# Patient Record
Sex: Male | Born: 1935 | Race: White | Hispanic: No | Marital: Married | Smoking: Former smoker
Health system: Southern US, Community
[De-identification: ages and names within clinical notes are randomized; demographics above are authoritative.]

## PROBLEM LIST (undated history)

## (undated) DIAGNOSIS — I4891 Unspecified atrial fibrillation: Secondary | ICD-10-CM

## (undated) DIAGNOSIS — R55 Syncope and collapse: Secondary | ICD-10-CM

## (undated) DIAGNOSIS — J449 Chronic obstructive pulmonary disease, unspecified: Secondary | ICD-10-CM

## (undated) DIAGNOSIS — R251 Tremor, unspecified: Secondary | ICD-10-CM

## (undated) DIAGNOSIS — I10 Essential (primary) hypertension: Secondary | ICD-10-CM

## (undated) HISTORY — PX: BRAIN SURGERY: SHX531

## (undated) HISTORY — DX: Chronic obstructive pulmonary disease, unspecified: J44.9

## (undated) HISTORY — PX: BACK SURGERY: SHX140

---

## 2008-07-17 ENCOUNTER — Ambulatory Visit: Payer: Self-pay | Admitting: Specialist

## 2008-11-13 ENCOUNTER — Emergency Department: Payer: Self-pay | Admitting: Emergency Medicine

## 2009-07-17 ENCOUNTER — Inpatient Hospital Stay: Payer: Self-pay | Admitting: Internal Medicine

## 2011-01-19 DIAGNOSIS — G250 Essential tremor: Secondary | ICD-10-CM | POA: Insufficient documentation

## 2011-07-22 ENCOUNTER — Ambulatory Visit: Payer: Self-pay | Admitting: Cardiovascular Disease

## 2011-10-31 ENCOUNTER — Observation Stay: Payer: Self-pay | Admitting: Internal Medicine

## 2011-11-03 ENCOUNTER — Emergency Department: Payer: Self-pay | Admitting: Emergency Medicine

## 2013-07-04 DIAGNOSIS — N529 Male erectile dysfunction, unspecified: Secondary | ICD-10-CM | POA: Insufficient documentation

## 2013-07-04 DIAGNOSIS — N138 Other obstructive and reflux uropathy: Secondary | ICD-10-CM | POA: Insufficient documentation

## 2013-07-04 DIAGNOSIS — N486 Induration penis plastica: Secondary | ICD-10-CM | POA: Insufficient documentation

## 2013-11-13 ENCOUNTER — Emergency Department: Payer: Self-pay | Admitting: Emergency Medicine

## 2013-11-13 LAB — CBC WITH DIFFERENTIAL/PLATELET
Basophil #: 0.1 10*3/uL (ref 0.0–0.1)
Eosinophil #: 0.1 10*3/uL (ref 0.0–0.7)
Eosinophil %: 0.6 %
Lymphocyte #: 1.5 10*3/uL (ref 1.0–3.6)
MCH: 29.8 pg (ref 26.0–34.0)
MCV: 88 fL (ref 80–100)
Monocyte #: 1 x10 3/mm (ref 0.2–1.0)
Neutrophil #: 7 10*3/uL — ABNORMAL HIGH (ref 1.4–6.5)
Neutrophil %: 72.8 %
Platelet: 145 10*3/uL — ABNORMAL LOW (ref 150–440)
RDW: 13.2 % (ref 11.5–14.5)

## 2013-11-13 LAB — COMPREHENSIVE METABOLIC PANEL
Alkaline Phosphatase: 64 U/L
Anion Gap: 6 — ABNORMAL LOW (ref 7–16)
BUN: 20 mg/dL — ABNORMAL HIGH (ref 7–18)
EGFR (African American): 43 — ABNORMAL LOW
Glucose: 105 mg/dL — ABNORMAL HIGH (ref 65–99)
Potassium: 3.6 mmol/L (ref 3.5–5.1)
SGOT(AST): 21 U/L (ref 15–37)
Sodium: 141 mmol/L (ref 136–145)
Total Protein: 7 g/dL (ref 6.4–8.2)

## 2013-11-13 LAB — URINALYSIS, COMPLETE
Bilirubin,UR: NEGATIVE
Blood: NEGATIVE
Nitrite: NEGATIVE
RBC,UR: 2 /HPF (ref 0–5)
WBC UR: 5 /HPF (ref 0–5)

## 2014-04-15 DIAGNOSIS — J439 Emphysema, unspecified: Secondary | ICD-10-CM | POA: Insufficient documentation

## 2015-04-29 ENCOUNTER — Other Ambulatory Visit: Payer: Self-pay

## 2015-04-29 ENCOUNTER — Emergency Department
Admission: EM | Admit: 2015-04-29 | Discharge: 2015-04-29 | Disposition: A | Discharge status: Home / Self Care | Payer: Medicare Other | Attending: Emergency Medicine | Admitting: Emergency Medicine

## 2015-04-29 ENCOUNTER — Encounter: Payer: Self-pay | Admitting: Emergency Medicine

## 2015-04-29 ENCOUNTER — Emergency Department: Payer: Medicare Other

## 2015-04-29 DIAGNOSIS — S0101XA Laceration without foreign body of scalp, initial encounter: Secondary | ICD-10-CM

## 2015-04-29 DIAGNOSIS — Y998 Other external cause status: Secondary | ICD-10-CM | POA: Insufficient documentation

## 2015-04-29 DIAGNOSIS — Y9389 Activity, other specified: Secondary | ICD-10-CM | POA: Insufficient documentation

## 2015-04-29 DIAGNOSIS — Y9289 Other specified places as the place of occurrence of the external cause: Secondary | ICD-10-CM | POA: Insufficient documentation

## 2015-04-29 DIAGNOSIS — Z79899 Other long term (current) drug therapy: Secondary | ICD-10-CM | POA: Insufficient documentation

## 2015-04-29 DIAGNOSIS — I10 Essential (primary) hypertension: Secondary | ICD-10-CM | POA: Insufficient documentation

## 2015-04-29 DIAGNOSIS — S51802A Unspecified open wound of left forearm, initial encounter: Secondary | ICD-10-CM | POA: Insufficient documentation

## 2015-04-29 DIAGNOSIS — W07XXXA Fall from chair, initial encounter: Secondary | ICD-10-CM | POA: Insufficient documentation

## 2015-04-29 DIAGNOSIS — S01112A Laceration without foreign body of left eyelid and periocular area, initial encounter: Secondary | ICD-10-CM | POA: Insufficient documentation

## 2015-04-29 DIAGNOSIS — Z7982 Long term (current) use of aspirin: Secondary | ICD-10-CM | POA: Diagnosis not present

## 2015-04-29 DIAGNOSIS — R55 Syncope and collapse: Secondary | ICD-10-CM

## 2015-04-29 DIAGNOSIS — Z23 Encounter for immunization: Secondary | ICD-10-CM | POA: Diagnosis not present

## 2015-04-29 HISTORY — DX: Essential (primary) hypertension: I10

## 2015-04-29 LAB — BASIC METABOLIC PANEL
Anion gap: 7 (ref 5–15)
BUN: 19 mg/dL (ref 6–20)
CO2: 26 mmol/L (ref 22–32)
Calcium: 8.6 mg/dL — ABNORMAL LOW (ref 8.9–10.3)
Chloride: 105 mmol/L (ref 101–111)
Creatinine, Ser: 1.36 mg/dL — ABNORMAL HIGH (ref 0.61–1.24)
GFR calc Af Amer: 55 mL/min — ABNORMAL LOW (ref >60)
GFR calc non Af Amer: 47 mL/min — ABNORMAL LOW (ref >60)
Glucose, Bld: 149 mg/dL — ABNORMAL HIGH (ref 65–99)
POTASSIUM: 3.8 mmol/L (ref 3.5–5.1)
SODIUM: 138 mmol/L (ref 135–145)

## 2015-04-29 LAB — PROTIME-INR
INR: 1.04
Prothrombin Time: 13.8 seconds (ref 11.4–15.0)

## 2015-04-29 LAB — CBC WITH DIFFERENTIAL/PLATELET
BASOS ABS: 0.1 10*3/uL (ref 0–0.1)
BASOS PCT: 0 %
EOS PCT: 0 %
Eosinophils Absolute: 0.1 10*3/uL (ref 0–0.7)
HCT: 41 % (ref 40.0–52.0)
Hemoglobin: 13.2 g/dL (ref 13.0–18.0)
Lymphocytes Relative: 20 %
Lymphs Abs: 2.8 10*3/uL (ref 1.0–3.6)
MCH: 28.9 pg (ref 26.0–34.0)
MCHC: 32.2 g/dL (ref 32.0–36.0)
MCV: 89.7 fL (ref 80.0–100.0)
Monocytes Absolute: 1 10*3/uL (ref 0.2–1.0)
Monocytes Relative: 7 %
NEUTROS PCT: 73 %
Neutro Abs: 10.4 10*3/uL — ABNORMAL HIGH (ref 1.4–6.5)
Platelets: 145 10*3/uL — ABNORMAL LOW (ref 150–440)
RBC: 4.57 MIL/uL (ref 4.40–5.90)
RDW: 13.9 % (ref 11.5–14.5)
WBC: 14.4 10*3/uL — AB (ref 3.8–10.6)

## 2015-04-29 MED ORDER — TETANUS-DIPHTHERIA TOXOIDS TD 5-2 LFU IM INJ
INJECTION | INTRAMUSCULAR | Status: AC
  Filled 2015-04-29: qty 0.5

## 2015-04-29 MED ORDER — TETANUS-DIPHTHERIA TOXOIDS TD 5-2 LFU IM INJ
0.5000 mL | INJECTION | Freq: Once | INTRAMUSCULAR | Status: AC
  Administered 2015-04-29: 0.5 mL via INTRAMUSCULAR

## 2015-04-29 NOTE — ED Notes (Signed)
Abrasion noted to top of scalp and left knee.

## 2015-04-29 NOTE — ED Notes (Signed)
Patient transported to CT 

## 2015-04-29 NOTE — Discharge Instructions (Signed)
Head Injury °You have received a head injury. It does not appear serious at this time. Headaches and vomiting are common following head injury. It should be easy to awaken from sleeping. Sometimes it is necessary for you to stay in the emergency department for a while for observation. Sometimes admission to the hospital may be needed. After injuries such as yours, most problems occur within the first 24 hours, but side effects may occur up to 7-10 days after the injury. It is important for you to carefully monitor your condition and contact your health care provider or seek immediate medical care if there is a change in your condition. °WHAT ARE THE TYPES OF HEAD INJURIES? °Head injuries can be as minor as a bump. Some head injuries can be more severe. More severe head injuries include: °· A jarring injury to the brain (concussion). °· A bruise of the brain (contusion). This mean there is bleeding in the brain that can cause swelling. °· A cracked skull (skull fracture). °· Bleeding in the brain that collects, clots, and forms a bump (hematoma). °WHAT CAUSES A HEAD INJURY? °A serious head injury is most likely to happen to someone who is in a car wreck and is not wearing a seat belt. Other causes of major head injuries include bicycle or motorcycle accidents, sports injuries, and falls. °HOW ARE HEAD INJURIES DIAGNOSED? °A complete history of the event leading to the injury and your current symptoms will be helpful in diagnosing head injuries. Many times, pictures of the brain, such as CT or MRI are needed to see the extent of the injury. Often, an overnight hospital stay is necessary for observation.  °WHEN SHOULD I SEEK IMMEDIATE MEDICAL CARE?  °You should get help right away if: °· You have confusion or drowsiness. °· You feel sick to your stomach (nauseous) or have continued, forceful vomiting. °· You have dizziness or unsteadiness that is getting worse. °· You have severe, continued headaches not relieved by  medicine. Only take over-the-counter or prescription medicines for pain, fever, or discomfort as directed by your health care provider. °· You do not have normal function of the arms or legs or are unable to walk. °· You notice changes in the black spots in the center of the colored part of your eye (pupil). °· You have a clear or bloody fluid coming from your nose or ears. °· You have a loss of vision. °During the next 24 hours after the injury, you must stay with someone who can watch you for the warning signs. This person should contact local emergency services (911 in the [place] if you have seizures, you become unconscious, or you are unable to wake up. °HOW CAN I PREVENT A HEAD INJURY IN THE FUTURE? °The most important factor for preventing major head injuries is avoiding motor vehicle accidents.  To minimize the potential for damage to your head, it is crucial to wear seat belts while riding in motor vehicles. Wearing helmets while bike riding and playing collision sports (like football) is also helpful. Also, avoiding dangerous activities around the house will further help reduce your risk of head injury.  °WHEN CAN I RETURN TO NORMAL ACTIVITIES AND ATHLETICS? °You should be reevaluated by your health care provider before returning to these activities. If you have any of the following symptoms, you should not return to activities or contact sports until 1 week after the symptoms have stopped: °· Persistent headache. °· Dizziness or vertigo. °· Poor attention and concentration. °· Confusion. °·   Memory problems.  Nausea or vomiting.  Fatigue or tire easily.  Irritability.  Intolerant of bright lights or loud noises.  Anxiety or depression.  Disturbed sleep. MAKE SURE YOU:   Understand these instructions.  Will watch your condition.  Will get help right away if you are not doing well or get worse. Document Released: 11/01/2005 Document Revised: 11/06/2013 Document Reviewed:  07/09/2013 [facility] Patient Information 2015 [place], [place]. This information is not intended to replace advice given to you by your health care provider. Make sure you discuss any questions you have with your health care provider.  Syncope Syncope is a medical term for fainting or passing out. This means you lose consciousness and drop to the ground. People are generally unconscious for less than 5 minutes. You may have some muscle twitches for up to 15 seconds before waking up and returning to normal. Syncope occurs more often in older adults, but it can happen to anyone. While most causes of syncope are not dangerous, syncope can be a sign of a serious medical problem. It is important to seek medical care.  CAUSES  Syncope is caused by a sudden drop in blood flow to the brain. The specific cause is often not determined. Factors that can bring on syncope include:  Taking medicines that lower blood pressure.  Sudden changes in posture, such as standing up quickly.  Taking more medicine than prescribed.  Standing in one place for too long.  Seizure disorders.  Dehydration and excessive exposure to heat.  Low blood sugar (hypoglycemia).  Straining to have a bowel movement.  Heart disease, irregular heartbeat, or other circulatory problems.  Fear, emotional distress, seeing blood, or severe pain. SYMPTOMS  Right before fainting, you may:  Feel dizzy or light-headed.  Feel nauseous.  See all white or all black in your field of vision.  Have cold, clammy skin. DIAGNOSIS  Your health care provider will ask about your symptoms, perform a physical exam, and perform an electrocardiogram (ECG) to record the electrical activity of your heart. Your health care provider may also perform other heart or blood tests to determine the cause of your syncope which may include:  Transthoracic echocardiogram (TTE). During echocardiography, sound waves are used to evaluate how blood flows through  your heart.  Transesophageal echocardiogram (TEE).  Cardiac monitoring. This allows your health care provider to monitor your heart rate and rhythm in real time.  Holter monitor. This is a portable device that records your heartbeat and can help diagnose heart arrhythmias. It allows your health care provider to track your heart activity for several days, if needed.  Stress tests by exercise or by giving medicine that makes the heart beat faster. TREATMENT  In most cases, no treatment is needed. Depending on the cause of your syncope, your health care provider may recommend changing or stopping some of your medicines. HOME CARE INSTRUCTIONS  Have someone stay with you until you feel stable.  Do not drive, use machinery, or play sports until your health care provider says it is okay.  Keep all follow-up appointments as directed by your health care provider.  Lie down right away if you start feeling like you might faint. Breathe deeply and steadily. Wait until all the symptoms have passed.  Drink enough fluids to keep your urine clear or pale yellow.  If you are taking blood pressure or heart medicine, get up slowly and take several minutes to sit and then stand. This can reduce dizziness. [place]  IF:   You have a severe headache.  You have unusual pain in the chest, abdomen, or back.  You are bleeding from your mouth or rectum, or you have black or tarry stool.  You have an irregular or very fast heartbeat.  You have pain with breathing.  You have repeated fainting or seizure-like jerking during an episode.  You faint when sitting or lying down.  You have confusion.  You have trouble walking.  You have severe weakness.  You have vision problems. If you fainted, call your local emergency services (911 in [place] Do not drive yourself to the hospital.  MAKE SURE YOU:  Understand these instructions.  Will watch your condition.  Will get help right  away if you are not doing well or get worse. Document Released: 11/01/2005 Document Revised: 11/06/2013 Document Reviewed: 12/31/2011 [facility] Patient Information 2015 [place], [place]. This information is not intended to replace advice given to you by your health care provider. Make sure you discuss any questions you have with your health care provider.  Stitches, Staples, or Skin Adhesive Strips  Stitches (sutures), staples, and skin adhesive strips hold the skin together as it heals. They will usually be in place for 7 days or less. HOME CARE  Wash your hands with soap and water before and after you touch your wound.  Only take medicine as told by your doctor.  Cover your wound only if your doctor told you to. Otherwise, leave it open to air.  Do not get your stitches wet or dirty. If they get dirty, dab them gently with a clean washcloth. Wet the washcloth with soapy water. Do not rub. Pat them dry gently.  Do not put medicine or medicated cream on your stitches unless your doctor told you to.  Do not take out your own stitches or staples. Skin adhesive strips will fall off by themselves.  Do not pick at the wound. Picking can cause an infection.  Do not miss your follow-up appointment.  If you have problems or questions, call your doctor. GET HELP RIGHT AWAY IF:   You have a temperature by mouth above 102 F (38.9 C), not controlled by medicine.  You have chills.  You have redness or pain around your stitches.  There is puffiness (swelling) around your stitches.  You notice fluid (drainage) from your stitches.  There is a bad smell coming from your wound. MAKE SURE YOU:  Understand these instructions.  Will watch your condition.  Will get help if you are not doing well or get worse. Document Released: 08/29/2009 Document Revised: 01/24/2012 Document Reviewed: 08/29/2009 [facility] Patient Information 2015 [place], [place]. This information is not intended to replace  advice given to you by your health care provider. Make sure you discuss any questions you have with your health care provider.

## 2015-04-29 NOTE — ED Notes (Signed)
Pt discharged home after verbalizing understanding of discharge instructions; nad noted. 

## 2015-04-29 NOTE — ED Provider Notes (Signed)
[facility] Emergency Department Provider Note  ____________________________________________  Time seen: 10:10 AM on arrival by EMS  I have reviewed the triage vital signs and the nursing notes.   HISTORY  Chief Complaint Loss of Consciousness    HPI Carlos Richard is a 81 y.o. male is brought to the ED for syncope at home. 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 This morning, he was feeling fatigued and ate watermelon and then walked outside and sat in a chair. He shortly thereafter passed out and fell out of the chair onto the ground.Marland Kitchen His wife called EMS who helped the patient back up as he was artery awake again, but while they are retrieving their stretcher the patient seemed to lose consciousness again and have some myoclonic jerks. According to the wife this is not unusual for the patient. He has hit the left side of his forehead and sustained a laceration in that area. He denies headache or neck pain chest pain shortness of breath abdominal pain back pain. No nausea vomiting diarrhea, no other acute symptoms. No vision changes     Past Medical History  Diagnosis Date  . GERD (gastroesophageal reflux disease)   . Hypertension     There are no active problems to display for this patient.   Past Surgical History  Procedure Laterality Date  . Back surgery    . Brain surgery      Current Outpatient Rx  Name  Route  Sig  Dispense  Refill  . aspirin 325 MG tablet   Oral   Take 325 mg by mouth daily.         . finasteride (PROSCAR) 5 MG tablet   Oral   Take 5 mg by mouth daily.         Marland Kitchen losartan-hydrochlorothiazide (HYZAAR) 50-12.5 MG per tablet   Oral   Take 1 tablet by mouth daily.         . magnesium oxide (MAG-OX) 400 MG tablet   Oral   Take 400 mg by mouth daily.         . metoprolol  tartrate (LOPRESSOR) 25 MG tablet   Oral   Take 25 mg by mouth 2 (two) times daily.         . pantoprazole (PROTONIX) 40 MG tablet   Oral   Take 40 mg by mouth daily.         . simvastatin (ZOCOR) 20 MG tablet   Oral   Take 20 mg by mouth daily.         Marland Kitchen terazosin (HYTRIN) 5 MG capsule   Oral   Take 5 mg by mouth at bedtime.         . vitamin E 200 UNIT capsule   Oral   Take 200 Units by mouth daily.           Allergies Review of patient's allergies indicates no known allergies.  No family history on file.  Social History History  Substance Use Topics  . Smoking status: Never Smoker   . Smokeless tobacco: Not on file  . Alcohol Use: No    Review of Systems  Constitutional: No fever or chills. No weight changes Eyes:No blurry vision or double vision.  ENT: No sore throat. Cardiovascular: No chest pain. Respiratory: No dyspnea or cough. Gastrointestinal: Negative for abdominal pain,  vomiting and diarrhea.  No BRBPR or melena. Genitourinary: Negative for dysuria, urinary retention, bloody urine, or difficulty urinating. Musculoskeletal: Negative for back pain. No joint swelling or pain. Skin: Negative for rash. Neurological: Negative for headaches, focal weakness or numbness. Psychiatric:No anxiety or depression.   Endocrine:No hot/cold intolerance, changes in energy, or sleep difficulty.  10-point ROS otherwise negative.  ____________________________________________   PHYSICAL EXAM:  VITAL SIGNS: ED Triage Vitals  Enc Vitals Group     BP 04/29/15 1018 110/65 mmHg     Pulse Rate 04/29/15 1018 69     Resp 04/29/15 1118 22     Temp 04/29/15 1018 98.4 F (36.9 C)     Temp Source 04/29/15 1018 Oral     SpO2 04/29/15 1018 91 %     Weight 04/29/15 1015 170 lb (77.111 kg)     Height 04/29/15 1015  (1.753 m)     Head Cir --      Peak Flow --      Pain Score --      Pain Loc --      Pain Edu? --      Excl. in GC? --       Constitutional: Alert and oriented. Well appearing and in no distress. Eyes: No scleral icterus. No conjunctival pallor. PERRL. EOMI ENT   Head: Normocephalic with contusion at the left supraorbital ridge and a 2 cm curvilinear laceration, wound hemostatic.   Nose: No congestion/rhinnorhea. No septal hematoma   Mouth/Throat: MMM, no pharyngeal erythema. No peritonsillar mass. No uvula shift.   Neck: No stridor. No SubQ emphysema. No meningismus. Hematological/Lymphatic/Immunilogical: No cervical lymphadenopathy. Cardiovascular: RRR. Normal and symmetric distal pulses are present in all extremities. No murmurs, rubs, or gallops. Respiratory: Normal respiratory effort without tachypnea nor retractions. Breath sounds are clear and equal bilaterally. No wheezes/rales/rhonchi. Gastrointestinal: Soft and nontender. No distention. There is no CVA tenderness.  No rebound, rigidity, or guarding. Genitourinary: deferred Musculoskeletal: Nontender with normal range of motion in all extremities. No joint effusions.  No lower extremity tenderness.  No edema. No bony tenderness or deformity. There is a large skin tear on the dorsal left proximal forearm about the extensor surface of the elbow measuring approximately 5 x 5 cm. Hemostatic, no laceration through the dermis. Will be repaired with Steri-Strips. Neurologic:   Normal speech and language.  CN 2-10 normal. Motor grossly intact. No pronator drift.  Normal gait. No gross focal neurologic deficits are appreciated.  Skin:  Skin is warm, dry and intact. No rash noted.  No petechiae, purpura, or bullae. Psychiatric: Mood and affect are normal. Speech and behavior are normal. Patient exhibits appropriate insight and judgment.  ____________________________________________    LABS (pertinent positives/negatives) (all labs ordered are listed, but only abnormal results are displayed) Labs Reviewed  BASIC METABOLIC PANEL - Abnormal;  Notable for the following:    Glucose, Bld 149 (*)    Creatinine, Ser 1.36 (*)    Calcium 8.6 (*)    GFR calc non Af Amer 47 (*)    GFR calc Af Amer 55 (*)    All other components within normal limits  CBC WITH DIFFERENTIAL/PLATELET - Abnormal; Notable for the following:    WBC 14.4 (*)    Platelets 145 (*)    Neutro Abs 10.4 (*)    All other components within normal limits  PROTIME-INR   ____________________________________________   EKG  Interpreted by me Normal sinus rhythm rate of 66, left axis, normal intervals, normal  QRS, ST segments and T waves.  ____________________________________________    RADIOLOGY  CT head and C-spine unremarkable  ____________________________________________   PROCEDURES LACERATION REPAIR Performed by: Scotty Court, Darol Cush Authorized by: Sharman Cheek Consent: Verbal consent obtained. Risks and benefits: risks, benefits and alternatives were discussed Consent given by: patient Patient identity confirmed: provided demographic data Prepped and Draped in normal sterile fashion Wound explored  Laceration Location: Left supraorbital ridge at lateral eyebrow  Laceration Length: 2 cm  No Foreign Bodies seen or palpated  Anesthesia: None   Irrigation method: syringe Amount of cleaning: standard  Skin closure: Dermabond   Patient tolerance: Patient tolerated the procedure well with no immediate complications. Wound well approximated and hemostatic.  ____________________________________________   INITIAL IMPRESSION / ASSESSMENT AND PLAN / ED COURSE  Pertinent labs & imaging results that were available during my care of the patient were reviewed by me and considered in my medical decision making (see chart for details).  Patient presents with syncope and scalp laceration due to a fall with blunt head trauma. We'll CT the head and neck and check labs and provide we'll wound care and wound  repair.  ----------------------------------------- 2:04 PM on 04/29/2015 ----------------------------------------- Workup unremarkable. CT negative. Wound repaired. Patient feels at baseline. Patient and wife comfortable with plan to discharge home and follow-up with primary care. Counseled on wound care at home with the Steri-Strips on the left elbow skin tear and the skin adhesive on the eyebrow. ____________________________________________   FINAL CLINICAL IMPRESSION(S) / ED DIAGNOSES  Final diagnoses:  Scalp laceration, initial encounter  Syncope, unspecified syncope type      Sharman Cheek, MD 04/29/15 1404

## 2015-04-29 NOTE — ED Notes (Signed)
Pt returned from ct

## 2015-04-29 NOTE — ED Notes (Signed)
Pt to ed via ems with reports of having a syncope episode while sitting in his chair causing him to fall forward hitting his head. Hematoma noted to left front forehead. Ems  reports when on scene pt alert but then had symtoms of a seizure. Pt known to have seizure activity after syncopal episode. Pt a/ox3 on arrival to ed with iv in place and fluids running. edp at bedside.

## 2015-07-03 ENCOUNTER — Emergency Department: Payer: Medicare Other

## 2015-07-03 ENCOUNTER — Other Ambulatory Visit: Payer: Self-pay

## 2015-07-03 ENCOUNTER — Emergency Department
Admission: EM | Admit: 2015-07-03 | Discharge: 2015-07-03 | Disposition: A | Discharge status: Home / Self Care | Payer: Medicare Other | Attending: Emergency Medicine | Admitting: Emergency Medicine

## 2015-07-03 ENCOUNTER — Encounter: Payer: Self-pay | Admitting: *Deleted

## 2015-07-03 DIAGNOSIS — R55 Syncope and collapse: Secondary | ICD-10-CM | POA: Diagnosis not present

## 2015-07-03 DIAGNOSIS — I10 Essential (primary) hypertension: Secondary | ICD-10-CM | POA: Insufficient documentation

## 2015-07-03 DIAGNOSIS — Z79899 Other long term (current) drug therapy: Secondary | ICD-10-CM | POA: Diagnosis not present

## 2015-07-03 DIAGNOSIS — Z7982 Long term (current) use of aspirin: Secondary | ICD-10-CM | POA: Diagnosis not present

## 2015-07-03 DIAGNOSIS — R42 Dizziness and giddiness: Secondary | ICD-10-CM | POA: Diagnosis present

## 2015-07-03 HISTORY — DX: Unspecified atrial fibrillation: I48.91

## 2015-07-03 HISTORY — DX: Syncope and collapse: R55

## 2015-07-03 HISTORY — DX: Tremor, unspecified: R25.1

## 2015-07-03 LAB — URINALYSIS COMPLETE WITH MICROSCOPIC (ARMC ONLY)
BACTERIA UA: NONE SEEN
BILIRUBIN URINE: NEGATIVE
GLUCOSE, UA: NEGATIVE mg/dL
Ketones, ur: NEGATIVE mg/dL
Leukocytes, UA: NEGATIVE
NITRITE: NEGATIVE
Protein, ur: NEGATIVE mg/dL
SQUAMOUS EPITHELIAL / LPF: NONE SEEN
Specific Gravity, Urine: 1.009 (ref 1.005–1.030)
pH: 6 (ref 5.0–8.0)

## 2015-07-03 LAB — CBC
HCT: 40.6 % (ref 40.0–52.0)
Hemoglobin: 13.7 g/dL (ref 13.0–18.0)
MCH: 29.2 pg (ref 26.0–34.0)
MCHC: 33.6 g/dL (ref 32.0–36.0)
MCV: 86.9 fL (ref 80.0–100.0)
Platelets: 162 10*3/uL (ref 150–440)
RBC: 4.67 MIL/uL (ref 4.40–5.90)
RDW: 13.9 % (ref 11.5–14.5)
WBC: 6.7 10*3/uL (ref 3.8–10.6)

## 2015-07-03 LAB — BASIC METABOLIC PANEL
Anion gap: 7 (ref 5–15)
BUN: 24 mg/dL — AB (ref 6–20)
CO2: 28 mmol/L (ref 22–32)
Calcium: 8.8 mg/dL — ABNORMAL LOW (ref 8.9–10.3)
Chloride: 104 mmol/L (ref 101–111)
Creatinine, Ser: 1.46 mg/dL — ABNORMAL HIGH (ref 0.61–1.24)
GFR calc Af Amer: 50 mL/min — ABNORMAL LOW (ref >60)
GFR, EST NON AFRICAN AMERICAN: 43 mL/min — AB (ref >60)
Glucose, Bld: 119 mg/dL — ABNORMAL HIGH (ref 65–99)
Potassium: 3.4 mmol/L — ABNORMAL LOW (ref 3.5–5.1)
SODIUM: 139 mmol/L (ref 135–145)

## 2015-07-03 LAB — TROPONIN I

## 2015-07-03 MED ORDER — MECLIZINE HCL 25 MG PO TABS
25.0000 mg | ORAL_TABLET | Freq: Once | ORAL | Status: AC
  Administered 2015-07-03: 25 mg via ORAL

## 2015-07-03 MED ORDER — ONDANSETRON HCL 4 MG/2ML IJ SOLN
4.0000 mg | Freq: Once | INTRAMUSCULAR | Status: AC
  Administered 2015-07-03: 4 mg via INTRAVENOUS

## 2015-07-03 MED ORDER — MECLIZINE HCL 25 MG PO TABS
ORAL_TABLET | ORAL | Status: AC
  Administered 2015-07-03: 25 mg via ORAL
  Filled 2015-07-03: qty 1

## 2015-07-03 MED ORDER — ONDANSETRON HCL 4 MG/2ML IJ SOLN
INTRAMUSCULAR | Status: AC
  Administered 2015-07-03: 4 mg via INTRAVENOUS
  Filled 2015-07-03: qty 2

## 2015-07-03 MED ORDER — SODIUM CHLORIDE 0.9 % IV BOLUS (SEPSIS)
1000.0000 mL | Freq: Once | INTRAVENOUS | Status: AC
  Administered 2015-07-03: 1000 mL via INTRAVENOUS

## 2015-07-03 NOTE — ED Provider Notes (Signed)
[facility] Emergency Department Provider Note  ____________________________________________  Time seen: On arrival, via EMS  I have reviewed the triage vital signs and the nursing notes.   HISTORY  Chief Complaint Dizziness    HPI Carlos Richard is a 81 y.o. male with a history of near-syncope and a deep brain stimulator who presents with dizziness that started at 5 AM this morning. Patient reports he had to go the bathroom and the room was spinning. He denies focal weakness. He reports the spinning sensation has improved but he does feel mildly nauseous. He reports he has chronic ringing in his ears. He has a deep brain stimulator for  tremors that was placed years ago. He has a history of atrial fibrillation but is not anticoagulated. 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 GERD (gastroesophageal reflux disease)   . Hypertension   . Atrial fibrillation   . Near syncope   . Tremors of nervous system     There are no active problems to display for this patient.   Past Surgical History  Procedure Laterality Date  . Back surgery    . Brain surgery      stimulator placement for tremors    Current Outpatient Rx  Name  Route  Sig  Dispense  Refill  . aspirin 325 MG tablet   Oral   Take 325 mg by mouth daily.         . finasteride (PROSCAR) 5 MG tablet   Oral   Take 5 mg by mouth daily.         Marland Kitchen losartan-hydrochlorothiazide (HYZAAR) 50-12.5 MG per tablet   Oral   Take 1 tablet by mouth daily.         . magnesium oxide (MAG-OX) 400 MG tablet   Oral   Take 400 mg by mouth daily.         . metoprolol tartrate (LOPRESSOR) 25 MG tablet   Oral   Take 25 mg by mouth 2 (two) times daily.         . pantoprazole (PROTONIX) 40 MG tablet   Oral   Take 40 mg by mouth daily.         .  simvastatin (ZOCOR) 20 MG tablet   Oral   Take 20 mg by mouth daily.         Marland Kitchen terazosin (HYTRIN) 5 MG capsule   Oral   Take 5 mg by mouth at bedtime.         . vitamin E 200 UNIT capsule   Oral   Take 200 Units by mouth daily.           Allergies Review of patient's allergies indicates no known allergies.  History reviewed. No pertinent family history.  Social History Social History  Substance Use Topics  . Smoking status: Never Smoker   . Smokeless tobacco: None  . Alcohol Use: Yes     Comment: occasionally    Review of Systems  Constitutional: Negative for fever. Eyes: Negative for visual changes. ENT: Negative for sore throat Cardiovascular: Negative for chest pain. Respiratory: Negative for shortness of breath. Gastrointestinal: Negative for abdominal pain, vomiting and diarrhea. Positive for mild nausea Genitourinary: Negative for dysuria. Musculoskeletal: Negative for back pain. Skin: Negative for rash. Neurological: Negative for headaches or focal weakness.  Positive for dizziness Psychiatric: No anxiety    ____________________________________________   PHYSICAL EXAM:  VITAL SIGNS: ED Triage Vitals  Enc Vitals Group     BP 07/03/15 0730 136/69 mmHg     Pulse Rate 07/03/15 0730 54     Resp 07/03/15 0730 18     Temp 07/03/15 0730 97.8 F (36.6 C)     Temp Source 07/03/15 0730 Oral     SpO2 07/03/15 0730 94 %     Weight 07/03/15 0730 174 lb (78.926 kg)     Height 07/03/15 0730 5\' 9"  (1.753 m)     Head Cir --      Peak Flow --      Pain Score --      Pain Loc --      Pain Edu? --      Excl. in GC? --      Constitutional: Alert and oriented. Well appearing and in no distress. Pleasant and interactive Eyes: Conjunctivae are normal. No nystagmus, PERRLA, EOMI ENT   Head: Normocephalic and atraumatic.   Mouth/Throat: Mucous membranes are moist. Cardiovascular: Normal rate, regular rhythm. Normal and symmetric distal pulses are  present in all extremities. No murmurs, rubs, or gallops. Respiratory: Normal respiratory effort without tachypnea nor retractions. Breath sounds are clear and equal bilaterally.  Gastrointestinal: Soft and non-tender in all quadrants. No distention. There is no CVA tenderness. Genitourinary: deferred Musculoskeletal: Nontender with normal range of motion in all extremities. No lower extremity tenderness nor edema. Neurologic:  Normal speech and language. Normal strength in all extremities. Cranial nerves II through XII are intact Skin:  Skin is warm, dry and intact. No rash noted. Psychiatric: Mood and affect are normal. Patient exhibits appropriate insight and judgment.  ____________________________________________    LABS (pertinent positives/negatives)  Labs Reviewed  BASIC METABOLIC PANEL  CBC  URINALYSIS COMPLETEWITH MICROSCOPIC ([facility] ONLY)    ____________________________________________   EKG  ED ECG REPORT I, Jene Every, the attending physician, personally viewed and interpreted this ECG.   Date: 07/03/2015  EKG Time: 7:33 AM  Rate: 55  Rhythm: sinus bradycardia  Axis: Left axis  Intervals:none  ST&T Change: None   ____________________________________________    RADIOLOGY I have personally reviewed any xrays that were ordered on this patient: CT head unremarkable  ____________________________________________   PROCEDURES  Procedure(s) performed: none  Critical Care performed: none  ____________________________________________   INITIAL IMPRESSION / ASSESSMENT AND PLAN / ED COURSE  Pertinent labs & imaging results that were available during my care of the patient were reviewed by me and considered in my medical decision making (see chart for details).  Patient with symptoms of vertigo. We will give normal saline, Zofran for nausea, meclizine. We will obtain CT head, labs, EKG and reevaluate.  After fluids patient felt significant better. His  orthostatics were unremarkable. He no longer had any dizziness. The CT head was normal. He is anxious to leave and I think is appropriate. He will follow-up with his PCP  ____________________________________________   FINAL CLINICAL IMPRESSION(S) / ED DIAGNOSES  Final diagnoses:  Near syncope     Jene Every, MD 07/03/15 [id number]

## 2015-07-03 NOTE — Discharge Instructions (Signed)
Near-Syncope Near-syncope (commonly known as near fainting) is sudden weakness, dizziness, or feeling like you might pass out. During an episode of near-syncope, you may also develop pale skin, have tunnel vision, or feel sick to your stomach (nauseous). Near-syncope may occur when getting up after sitting or while standing for a long time. It is caused by a sudden decrease in blood flow to the brain. This decrease can result from various causes or triggers, most of which are not serious. However, because near-syncope can sometimes be a sign of something serious, a medical evaluation is required. The specific cause is often not determined. HOME CARE INSTRUCTIONS  Monitor your condition for any changes. The following actions may help to alleviate any discomfort you are experiencing:  Have someone stay with you until you feel stable.  Lie down right away and prop your feet up if you start feeling like you might faint. Breathe deeply and steadily. Wait until all the symptoms have passed. Most of these episodes last only a few minutes. You may feel tired for several hours.   Drink enough fluids to keep your urine clear or pale yellow.   If you are taking blood pressure or heart medicine, get up slowly when seated or lying down. Take several minutes to sit and then stand. This can reduce dizziness.  Follow up with your health care provider as directed. SEEK IMMEDIATE MEDICAL CARE IF:   You have a severe headache.   You have unusual pain in the chest, abdomen, or back.   You are bleeding from the mouth or rectum, or you have black or tarry stool.   You have an irregular or very fast heartbeat.   You have repeated fainting or have seizure-like jerking during an episode.   You faint when sitting or lying down.   You have confusion.   You have difficulty walking.   You have severe weakness.   You have vision problems.  MAKE SURE YOU:   Understand these instructions.  Will  watch your condition.  Will get help right away if you are not doing well or get worse. Document Released: 11/01/2005 Document Revised: 11/06/2013 Document Reviewed: 04/06/2013 ExitCare Patient Information 2015 [employer]. This information is not intended to replace advice given to you by your health care provider. Make sure you discuss any questions you have with your health care provider.  

## 2015-07-03 NOTE — ED Notes (Signed)
Per EMS report, patient woke up at 0500 and c/o dizziness, "room was spinning," nausea, but no vomiting. Patient has a hx. Of afib and near-syncopal events.

## 2017-10-31 ENCOUNTER — Encounter: Payer: Self-pay | Admitting: Urology

## 2017-10-31 ENCOUNTER — Ambulatory Visit: Payer: Medicare Other | Admitting: Urology

## 2017-10-31 VITALS — BP 111/42 | HR 92 | Ht 69.0 in | Wt 170.0 lb

## 2017-10-31 DIAGNOSIS — N401 Enlarged prostate with lower urinary tract symptoms: Secondary | ICD-10-CM | POA: Diagnosis not present

## 2017-10-31 MED ORDER — TERAZOSIN HCL 5 MG PO CAPS: 5.0000 mg | ORAL_CAPSULE | Freq: Every day | ORAL | 3 refills | Status: DC

## 2017-10-31 MED ORDER — FINASTERIDE 5 MG PO TABS: 5.0000 mg | ORAL_TABLET | Freq: Every day | ORAL | 3 refills | Status: DC

## 2017-10-31 NOTE — Progress Notes (Signed)
10/31/2017 8:43 AM   Carlos SnowHoward R Richard [birth year] [id number]  Referring provider: Steele Sizerrissman, Mark A, MD [address] [place], [place] [address]  Chief Complaint  Patient presents with  . Benign Prostatic Hypertrophy    1year    HPI: 83 year old male presents for annual follow-up of BPH.  He remains on combination therapy with finasteride and terazosin.  He has no bothersome lower urinary tract symptoms.  [facility].  A prostate biopsy was performed in 2007 for a PSA of 4.7.  His PSA subsequently normalized and he elected to discontinue PSA screening 2 years ago.  He denies dysuria or gross hematuria.  He denies flank, abdominal, pelvic or scrotal pain.   PMH: Past Medical History:  Diagnosis Date  . Atrial fibrillation (HCC)   . GERD (gastroesophageal reflux disease)   . Hypertension   . Near syncope   . Tremors of nervous system     Surgical History: Past Surgical History:  Procedure Laterality Date  . BACK SURGERY    . BRAIN SURGERY     stimulator placement for tremors    Home Medications:  Allergies as of 10/31/2017   No Known Allergies     Medication List        Accurate as of 10/31/17  8:43 AM. Always use your most recent med list.          aspirin 325 MG tablet Take 325 mg by mouth daily.   CHERRY PO Take 1 capsule by mouth daily. Taken for gout   finasteride 5 MG tablet Commonly known as:  PROSCAR Take 5 mg by mouth daily.   furosemide 20 MG tablet Commonly known as:  LASIX   losartan 50 MG tablet Commonly known as:  COZAAR   losartan-hydrochlorothiazide 50-12.5 MG tablet Commonly known as:  HYZAAR Take 1 tablet by mouth daily.   magnesium oxide 400 MG tablet Commonly known as:  MAG-OX Take 400 mg by mouth daily.   metoprolol tartrate 25 MG tablet Commonly known as:  LOPRESSOR Take 25 mg by mouth daily.   pantoprazole 40 MG tablet Commonly known as:  PROTONIX Take 40 mg by mouth daily.   simvastatin 20  MG tablet Commonly known as:  ZOCOR Take 20 mg by mouth daily.   terazosin 5 MG capsule Commonly known as:  HYTRIN Take 5 mg by mouth at bedtime.   TOPROL XL 25 MG 24 hr tablet Generic drug:  metoprolol succinate   vitamin E 200 UNIT capsule Take 200 Units by mouth daily.   XARELTO 15 MG Tabs tablet Generic drug:  Rivaroxaban   ZINC PO Take 1 tablet by mouth daily.       Allergies: No Known Allergies  Family History: Family History  Problem Relation Age of Onset  . Prostate cancer Neg Hx   . Bladder Cancer Neg Hx   . Kidney cancer Neg Hx     Social History:  reports that he has quit smoking. he has never used smokeless tobacco. He reports that he drinks alcohol. He reports that he does not use drugs.  ROS: UROLOGY Frequent Urination?: No Hard to postpone urination?: No Burning/pain with urination?: No Get up at night to urinate?: No Leakage of urine?: No Urine stream starts and stops?: No Trouble starting stream?: No Do you have to strain to urinate?: No Blood in urine?: No Urinary tract infection?: No Sexually transmitted disease?: No Injury to kidneys or bladder?: No Painful intercourse?: No  Weak stream?: No Erection problems?: No Penile pain?: No  Gastrointestinal Nausea?: No Vomiting?: No Indigestion/heartburn?: No Diarrhea?: No Constipation?: No  Constitutional Fever: No Night sweats?: No Weight loss?: No Fatigue?: No  Skin Skin rash/lesions?: No Itching?: No  Eyes Blurred vision?: No Double vision?: No  Ears/Nose/Throat Sore throat?: No Sinus problems?: No  Hematologic/Lymphatic Swollen glands?: No Easy bruising?: No  Cardiovascular Leg swelling?: No Chest pain?: No  Respiratory Cough?: No Shortness of breath?: No  Endocrine Excessive thirst?: No  Musculoskeletal Back pain?: No Joint pain?: No  Neurological Headaches?: No Dizziness?: No  Psychologic Depression?: No Anxiety?: No  Physical Exam: BP (!)  111/42   Pulse 92   Ht 5\' 9"  (1.753 m)   Wt 170 lb (77.1 kg)   BMI 25.10 kg/m   Constitutional:  Alert and oriented, No acute distress. HEENT: [place] AT, moist mucus membranes.  Trachea midline, no masses. Cardiovascular: No clubbing, cyanosis, or edema. Respiratory: Normal respiratory effort, no increased work of breathing. GI: Abdomen is soft, nontender, nondistended, no abdominal masses GU: No CVA tenderness.  Prostate 50 g, smooth without nodules Skin: No rashes, bruises or suspicious lesions. Lymph: No cervical or inguinal adenopathy. Neurologic: Grossly intact, no focal deficits, moving all 4 extremities. Psychiatric: Normal mood and affect.  Laboratory Data: Lab Results  Component Value Date   WBC 6.7 07/03/2015   HGB 13.7 07/03/2015   HCT 40.6 07/03/2015   MCV 86.9 07/03/2015   PLT 162 07/03/2015    Lab Results  Component Value Date   CREATININE 1.46 (H) 07/03/2015     Assessment & Plan:    1. Benign prostatic hyperplasia with lower urinary tract symptoms, symptom details unspecified Stable.  Terazosin and finasteride were refilled.  Continue annual follow-up.   Return in about 1 year (around 10/31/2018) for Recheck.  Riki AltesScott C Samanthamarie Ezzell, MD  [facility] [address], [address] [place], [place] [address] [phone]

## 2018-08-03 ENCOUNTER — Encounter: Payer: Self-pay | Admitting: Family Medicine

## 2018-08-03 ENCOUNTER — Ambulatory Visit: Payer: Medicare Other | Admitting: Family Medicine

## 2018-08-03 VITALS — BP 122/68 | HR 68 | Temp 97.5°F | Ht 68.2 in | Wt 172.0 lb

## 2018-08-03 DIAGNOSIS — I129 Hypertensive chronic kidney disease with stage 1 through stage 4 chronic kidney disease, or unspecified chronic kidney disease: Secondary | ICD-10-CM | POA: Insufficient documentation

## 2018-08-03 DIAGNOSIS — G250 Essential tremor: Secondary | ICD-10-CM

## 2018-08-03 DIAGNOSIS — M109 Gout, unspecified: Secondary | ICD-10-CM | POA: Insufficient documentation

## 2018-08-03 DIAGNOSIS — M1A9XX1 Chronic gout, unspecified, with tophus (tophi): Secondary | ICD-10-CM

## 2018-08-03 DIAGNOSIS — I10 Essential (primary) hypertension: Secondary | ICD-10-CM | POA: Diagnosis not present

## 2018-08-03 DIAGNOSIS — J449 Chronic obstructive pulmonary disease, unspecified: Secondary | ICD-10-CM | POA: Insufficient documentation

## 2018-08-03 DIAGNOSIS — N401 Enlarged prostate with lower urinary tract symptoms: Secondary | ICD-10-CM

## 2018-08-03 DIAGNOSIS — N138 Other obstructive and reflux uropathy: Secondary | ICD-10-CM

## 2018-08-03 DIAGNOSIS — J439 Emphysema, unspecified: Secondary | ICD-10-CM | POA: Diagnosis not present

## 2018-08-03 DIAGNOSIS — I4891 Unspecified atrial fibrillation: Secondary | ICD-10-CM | POA: Diagnosis not present

## 2018-08-03 DIAGNOSIS — E782 Mixed hyperlipidemia: Secondary | ICD-10-CM

## 2018-08-03 LAB — UA/M W/RFLX CULTURE, ROUTINE
BILIRUBIN UA: NEGATIVE
Glucose, UA: NEGATIVE
KETONES UA: NEGATIVE
LEUKOCYTES UA: NEGATIVE
NITRITE UA: NEGATIVE
RBC UA: NEGATIVE
SPEC GRAV UA: 1.015 (ref 1.005–1.030)
Urobilinogen, Ur: 0.2 mg/dL (ref 0.2–1.0)
pH, UA: 7.5 (ref 5.0–7.5)

## 2018-08-03 LAB — MICROALBUMIN, URINE WAIVED
Creatinine, Urine Waived: 200 mg/dL (ref 10–300)
Microalb, Ur Waived: 80 mg/L — ABNORMAL HIGH (ref 0–19)

## 2018-08-03 MED ORDER — FLUTICASONE-SALMETEROL 250-50 MCG/DOSE IN AEPB: 1.0000 | INHALATION_SPRAY | Freq: Two times a day (BID) | RESPIRATORY_TRACT | 3 refills | Status: DC

## 2018-08-03 MED ORDER — LOSARTAN POTASSIUM 50 MG PO TABS: 50.0000 mg | ORAL_TABLET | Freq: Every day | ORAL | 1 refills | Status: DC

## 2018-08-03 MED ORDER — TOPROL XL 25 MG PO TB24: 25.0000 mg | ORAL_TABLET | Freq: Every day | ORAL | 1 refills | Status: DC

## 2018-08-03 MED ORDER — SIMVASTATIN 20 MG PO TABS: 20.0000 mg | ORAL_TABLET | Freq: Every day | ORAL | 1 refills | Status: DC

## 2018-08-03 NOTE — Progress Notes (Signed)
BP 122/68 (BP Location: Left Arm, Patient Position: Sitting, Cuff Size: Normal)   Pulse 68   Temp (!) 97.5 F (36.4 C)   Ht 5' 8.2" (1.732 m)   Wt 172 lb (78 kg)   SpO2 97%   BMI 26.00 kg/m    Subjective:    Patient ID: Carlos Richard, male    DOB: 06-Sep-1932, 84 y.o.   MRN: [id number]  HPI: Carlos Richard is a 84 y.o. male who presents today to establish care.  Chief Complaint  Patient presents with  . Establish Care  . Hypertension  . Hyperlipidemia   HYPERTENSION / HYPERLIPIDEMIA Satisfied with current treatment? yes Duration of hypertension: chronic BP monitoring frequency: not checking BP medication side effects: no Past BP meds: losartan, metoprolol, lasix Duration of hyperlipidemia: chronic Cholesterol medication side effects: no Cholesterol supplements: none Past cholesterol medications: simvastatin Medication compliance: excellent compliance Aspirin: no Recent stressors: no Recurrent headaches: no Visual changes: no Palpitations: no Dyspnea: no Chest pain: no Lower extremity edema: no Dizzy/lightheaded: no   Active Ambulatory Problems    Diagnosis Date Noted  . 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 Essential tremor 01/19/2011  . 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 Hypertension   . COPD (chronic obstructive pulmonary disease) (HCC)   . Atrial fibrillation (HCC)   . Gout 08/03/2018  . Mixed hyperlipidemia 08/03/2018   Resolved Ambulatory Problems    Diagnosis Date Noted  . Emphysema, unspecified (HCC) 04/15/2014   Past Medical History:  Diagnosis Date  . Near syncope   . Tremors of nervous system    Past Surgical History:  Procedure Laterality Date  . BACK SURGERY    . BRAIN SURGERY     stimulator placement for tremors   Outpatient Encounter Medications as of 08/03/2018  Medication Sig  . CHERRY PO Take 1 capsule by mouth daily. Taken for gout  .  finasteride (PROSCAR) 5 MG tablet Take 1 tablet (5 mg total) by mouth daily.  . furosemide (LASIX) 20 MG tablet   . losartan (COZAAR) 50 MG tablet Take 1 tablet (50 mg total) by mouth daily.  . magnesium oxide (MAG-OX) 400 MG tablet Take 400 mg by mouth daily.  . Multiple Vitamins-Minerals (ZINC PO) Take 1 tablet by mouth daily.  . simvastatin (ZOCOR) 20 MG tablet Take 1 tablet (20 mg total) by mouth daily.  Marland Kitchen terazosin (HYTRIN) 5 MG capsule Take 1 capsule (5 mg total) by mouth at bedtime.  . TOPROL XL 25 MG 24 hr tablet Take 1 tablet (25 mg total) by mouth daily.  Marland Kitchen UNABLE TO FIND CuraMed  . vitamin E 200 UNIT capsule Take 200 Units by mouth daily.  [name] 15 MG TABS tablet   . Zinc 50 MG CAPS Take by mouth.  . [DISCONTINUED] losartan (COZAAR) 50 MG tablet   . [DISCONTINUED] metoprolol tartrate (LOPRESSOR) 25 MG tablet Take 25 mg by mouth daily.   . [DISCONTINUED] simvastatin (ZOCOR) 20 MG tablet Take 20 mg by mouth daily.  . [DISCONTINUED] TOPROL XL 25 MG 24 hr tablet   . Fluticasone-Salmeterol (ADVAIR DISKUS) 250-50 MCG/DOSE AEPB Inhale 1 puff into the lungs 2 (two) times daily.  . [DISCONTINUED] aspirin 325 MG tablet Take 325 mg by mouth daily.  . [DISCONTINUED] losartan-hydrochlorothiazide (HYZAAR) 50-12.5 MG per tablet Take 1 tablet by mouth daily.  . [DISCONTINUED] pantoprazole (PROTONIX) 40 MG tablet Take 40 mg by mouth daily.  No facility-administered encounter medications on file as of 08/03/2018.    No Known Allergies Social History   Socioeconomic History  . Marital status: Married    Spouse name: Not on file  . Number of children: Not on file  . Years of education: Not on file  . Highest education level: Not on file  Occupational History  . Not on file  Social Needs  . Financial resource strain: Not on file  . Food insecurity:    Worry: Not on file    Inability: Not on file  . Transportation needs:    Medical: Not on file    Non-medical: Not on file  Tobacco  Use  . Smoking status: Former Games developermoker  . Smokeless tobacco: Never Used  Substance and Sexual Activity  . Alcohol use: Yes    Comment: occasionally  . Drug use: No  . Sexual activity: Yes  Lifestyle  . Physical activity:    Days per week: Not on file    Minutes per session: Not on file  . Stress: Not on file  Relationships  . Social connections:    Talks on phone: Not on file    Gets together: Not on file    Attends religious service: Not on file    Active member of club or organization: Not on file    Attends meetings of clubs or organizations: Not on file    Relationship status: Not on file  Other Topics Concern  . Not on file  Social History Narrative  . Not on file   Family History  Problem Relation Age of Onset  . Tuberculosis Mother   . Tremor Paternal Grandmother   . Obesity Sister   . Prostate cancer Neg Hx   . Bladder Cancer Neg Hx   . Kidney cancer Neg Hx      Review of Systems  Constitutional: Negative.   Respiratory: Negative.   Cardiovascular: Negative.   Genitourinary: Negative.   Musculoskeletal: Negative.   Skin: Negative.   Psychiatric/Behavioral: Negative.     Per HPI unless specifically indicated above     Objective:    BP 122/68 (BP Location: Left Arm, Patient Position: Sitting, Cuff Size: Normal)   Pulse 68   Temp (!) 97.5 F (36.4 C)   Ht 5' 8.2" (1.732 m)   Wt 172 lb (78 kg)   SpO2 97%   BMI 26.00 kg/m   Wt Readings from Last 3 Encounters:  08/03/18 172 lb (78 kg)  10/31/17 170 lb (77.1 kg)  07/03/15 174 lb (78.9 kg)    Physical Exam  Constitutional: He is oriented to person, place, and time. He appears well-developed and well-nourished. No distress.  HENT:  Head: Normocephalic and atraumatic.  Right Ear: Hearing normal.  Left Ear: Hearing normal.  Nose: Nose normal.  Eyes: Conjunctivae and lids are normal. Right eye exhibits no discharge. Left eye exhibits no discharge. No scleral icterus.  Cardiovascular: Normal rate,  regular rhythm, normal heart sounds and intact distal pulses. Exam reveals no gallop and no friction rub.  No murmur heard. Pulmonary/Chest: Effort normal and breath sounds normal. No stridor. No respiratory distress. He has no wheezes. He has no rales. He exhibits no tenderness.  Musculoskeletal: Normal range of motion.  Neurological: He is alert and oriented to person, place, and time.  Skin: Skin is warm, dry and intact. Capillary refill takes less than 2 seconds. No rash noted. He is not diaphoretic. No erythema. No pallor.  Psychiatric: He has a  normal mood and affect. His speech is normal and behavior is normal. Judgment and thought content normal. Cognition and memory are normal.  Nursing note and vitals reviewed.   Results for orders placed or performed in visit on 08/03/18  Microalbumin, Urine Waived  Result Value Ref Range   Microalb, Ur Waived 80 (H) 0 - 19 mg/L   Creatinine, Urine Waived 200 10 - 300 mg/dL   Microalb/Creat Ratio 30-300 (H) <30 mg/g  UA/M w/rflx Culture, Routine  Result Value Ref Range   Specific Gravity, UA 1.015 1.005 - 1.030   pH, UA 7.5 5.0 - 7.5   Color, UA Orange Yellow   Appearance Ur Clear Clear   Leukocytes, UA Negative Negative   Protein, UA Trace (A) Negative/Trace   Glucose, UA Negative Negative   Ketones, UA Negative Negative   RBC, UA Negative Negative   Bilirubin, UA Negative Negative   Urobilinogen, Ur 0.2 0.2 - 1.0 mg/dL   Nitrite, UA Negative Negative      Assessment & Plan:   Problem List Items Addressed This Visit      Cardiovascular and Mediastinum   Hypertension - Primary    Under good control on current regimen. Continue current regimen. Continue to monitor. Call with any concerns. Refills given.        Relevant Medications   TOPROL XL 25 MG 24 hr tablet   simvastatin (ZOCOR) 20 MG tablet   losartan (COZAAR) 50 MG tablet   Other Relevant Orders   Comprehensive metabolic panel   Microalbumin, Urine Waived (Completed)    UA/M w/rflx Culture, Routine (Completed)   TSH   Atrial fibrillation (HCC)    Stable. Continue to follow with cardiology. Continue xarelto. Call with any concerns.       Relevant Medications   TOPROL XL 25 MG 24 hr tablet   simvastatin (ZOCOR) 20 MG tablet   losartan (COZAAR) 50 MG tablet   Other Relevant Orders   CBC with Differential/Platelet   Comprehensive metabolic panel   UA/M w/rflx Culture, Routine (Completed)   TSH     Respiratory   COPD (chronic obstructive pulmonary disease) (HCC)    Needs refill on his advair. Rx given today. Call with any concerns.       Relevant Medications   Fluticasone-Salmeterol (ADVAIR DISKUS) 250-50 MCG/DOSE AEPB   Other Relevant Orders   Comprehensive metabolic panel   UA/M w/rflx Culture, Routine (Completed)   TSH     Nervous and Auditory   Essential tremor   Relevant Orders   Comprehensive metabolic panel   UA/M w/rflx Culture, Routine (Completed)   TSH     Genitourinary   BPH with obstruction/lower urinary tract symptoms    Follows with urology. Stable. Continue to monitor. Call with any concerns.       Relevant Orders   Comprehensive metabolic panel   UA/M w/rflx Culture, Routine (Completed)   TSH     Other   Gout    Under good control on current regimen. Continue current regimen. Continue to monitor. Call with any concerns. Refills given.        Relevant Orders   Comprehensive metabolic panel   UA/M w/rflx Culture, Routine (Completed)   TSH   Uric acid   Mixed hyperlipidemia    Under good control on current regimen. Continue current regimen. Continue to monitor. Call with any concerns. Refills given.        Relevant Medications   TOPROL XL 25 MG 24 hr tablet  simvastatin (ZOCOR) 20 MG tablet   losartan (COZAAR) 50 MG tablet   Other Relevant Orders   Lipid Panel w/o Chol/HDL Ratio   UA/M w/rflx Culture, Routine (Completed)   TSH       Follow up plan: Return in about 6 months (around 02/01/2019) for  Wellness/physical.

## 2018-08-03 NOTE — Assessment & Plan Note (Signed)
Under good control on current regimen. Continue current regimen. Continue to monitor. Call with any concerns. Refills given.   

## 2018-08-03 NOTE — Assessment & Plan Note (Signed)
Follows with urology. Stable. Continue to monitor. Call with any concerns.

## 2018-08-03 NOTE — Assessment & Plan Note (Signed)
Stable. Continue to follow with cardiology. Continue xarelto. Call with any concerns.

## 2018-08-03 NOTE — Assessment & Plan Note (Signed)
Needs refill on his advair. Rx given today. Call with any concerns.

## 2018-08-04 LAB — COMPREHENSIVE METABOLIC PANEL
A/G RATIO: 2 (ref 1.2–2.2)
ALT: 21 IU/L (ref 0–44)
AST: 24 IU/L (ref 0–40)
Albumin: 4.4 g/dL (ref 3.5–4.7)
Alkaline Phosphatase: 64 IU/L (ref 39–117)
BILIRUBIN TOTAL: 0.8 mg/dL (ref 0.0–1.2)
BUN/Creatinine Ratio: 9 — ABNORMAL LOW (ref 10–24)
BUN: 12 mg/dL (ref 8–27)
CHLORIDE: 105 mmol/L (ref 96–106)
CO2: 25 mmol/L (ref 20–29)
Calcium: 9.4 mg/dL (ref 8.6–10.2)
Creatinine, Ser: 1.31 mg/dL — ABNORMAL HIGH (ref 0.76–1.27)
GFR calc non Af Amer: 50 mL/min/{1.73_m2} — ABNORMAL LOW (ref >59)
GFR, EST AFRICAN AMERICAN: 57 mL/min/{1.73_m2} — AB (ref >59)
Globulin, Total: 2.2 g/dL (ref 1.5–4.5)
Glucose: 99 mg/dL (ref 65–99)
POTASSIUM: 4.6 mmol/L (ref 3.5–5.2)
Sodium: 145 mmol/L — ABNORMAL HIGH (ref 134–144)
Total Protein: 6.6 g/dL (ref 6.0–8.5)

## 2018-08-04 LAB — CBC WITH DIFFERENTIAL/PLATELET
BASOS: 1 %
Basophils Absolute: 0.1 10*3/uL (ref 0.0–0.2)
EOS (ABSOLUTE): 0.3 10*3/uL (ref 0.0–0.4)
Eos: 3 %
Hematocrit: 43.3 % (ref 37.5–51.0)
Hemoglobin: 14.3 g/dL (ref 13.0–17.7)
Immature Grans (Abs): 0 10*3/uL (ref 0.0–0.1)
Immature Granulocytes: 0 %
LYMPHS: 32 %
Lymphocytes Absolute: 3 10*3/uL (ref 0.7–3.1)
MCH: 30 pg (ref 26.6–33.0)
MCHC: 33 g/dL (ref 31.5–35.7)
MCV: 91 fL (ref 79–97)
MONOCYTES: 8 %
MONOS ABS: 0.7 10*3/uL (ref 0.1–0.9)
NEUTROS ABS: 5.1 10*3/uL (ref 1.4–7.0)
NEUTROS PCT: 56 %
PLATELETS: 193 10*3/uL (ref 150–450)
RBC: 4.76 x10E6/uL (ref 4.14–5.80)
RDW: 13.5 % (ref 12.3–15.4)
WBC: 9.2 10*3/uL (ref 3.4–10.8)

## 2018-08-04 LAB — URIC ACID: URIC ACID: 6.9 mg/dL (ref 3.7–8.6)

## 2018-08-04 LAB — LIPID PANEL W/O CHOL/HDL RATIO
Cholesterol, Total: 129 mg/dL (ref 100–199)
HDL: 40 mg/dL (ref >39)
LDL Calculated: 62 mg/dL (ref 0–99)
TRIGLYCERIDES: 133 mg/dL (ref 0–149)
VLDL Cholesterol Cal: 27 mg/dL (ref 5–40)

## 2018-08-04 LAB — TSH: TSH: 1.35 u[IU]/mL (ref 0.450–4.500)

## 2018-08-07 ENCOUNTER — Encounter: Payer: Self-pay | Admitting: Family Medicine

## 2018-08-07 DIAGNOSIS — N183 Chronic kidney disease, stage 3 unspecified: Secondary | ICD-10-CM | POA: Insufficient documentation

## 2018-08-09 ENCOUNTER — Telehealth: Payer: Self-pay | Admitting: Family Medicine

## 2018-08-09 NOTE — Telephone Encounter (Signed)
Copied from CRM [id number]. Topic: General - Other >> Aug 09, 2018 10:56 AM Trula Slade wrote: Reason for CRM:  Amy w/[facility] [phone] Ref# [id number].  The patient's insurance company does not cover TOPROL XL 25 MG 24 hr tablet medication unless there are special circumstances that exist.  They do cover Metoprolol Succinate ER 25mg . Please advise.

## 2018-08-09 NOTE — Telephone Encounter (Signed)
Patient must have brand name? They will Metoprol ER they will cover, but they do not cover Toprol. Please advise.

## 2018-08-09 NOTE — Telephone Encounter (Signed)
He has been on the medicine for years, it works well, and he has difficulty remembering to take medicine BID. Let's see if that works. I'm wondering if he needs a PA

## 2018-08-10 NOTE — Telephone Encounter (Signed)
Can we check with the patient as to why he needs the brand name? I don't know him and I don't know why he would.

## 2018-08-10 NOTE — Telephone Encounter (Signed)
Fabulous!

## 2018-08-10 NOTE — Telephone Encounter (Signed)
Called and verified that patient does not need brand name. Called [employer] back and spoke to Leslie, Teacher, early years/pre, and RX was changed to generic. While on the phone with patient he asked about Advair. Victorino Dike confirmed it was received and will be sent out soon!

## 2018-10-30 ENCOUNTER — Ambulatory Visit: Payer: Medicare Other | Admitting: Urology

## 2018-11-15 HISTORY — PX: EYE SURGERY: SHX253

## 2018-11-23 ENCOUNTER — Encounter: Payer: Self-pay | Admitting: Nurse Practitioner

## 2018-11-23 ENCOUNTER — Ambulatory Visit (INDEPENDENT_AMBULATORY_CARE_PROVIDER_SITE_OTHER): Payer: Medicare Other | Admitting: Nurse Practitioner

## 2018-11-23 VITALS — BP 136/65 | HR 63 | Temp 98.0°F | Wt 173.0 lb

## 2018-11-23 DIAGNOSIS — Z01818 Encounter for other preprocedural examination: Secondary | ICD-10-CM

## 2018-11-23 LAB — PROTIME-INR
INR: 1.3 — ABNORMAL HIGH (ref 0.8–1.2)
Prothrombin Time: 16.2 s — ABNORMAL HIGH (ref 9.1–12.0)

## 2018-11-23 NOTE — Assessment & Plan Note (Signed)
EKG remains similar to previous on review.  Denies cardiac, pulmonary, or neuro symptoms at this time.  Will obtain CBC, BMP, INR (1.3 today).  If within normal or baseline range will clear for surgery.

## 2018-11-23 NOTE — Patient Instructions (Signed)
Atrial Fibrillation    Atrial fibrillation is a type of heartbeat that is irregular or fast (rapid). If you have this condition, your heart beats without any order. This makes it hard for your heart to pump blood in a normal way. Having this condition gives you more risk for stroke, heart failure, and other heart problems.  Atrial fibrillation may start all of a sudden and then stop on its own, or it may become a long-lasting problem.  What are the causes?  This condition may be caused by heart conditions, such as:  · High blood pressure.  · Heart failure.  · Heart valve disease.  · Heart surgery.  Other causes include:  · Pneumonia.  · Obstructive sleep apnea.  · Lung cancer.  · Thyroid disease.  · Drinking too much alcohol.  Sometimes the cause is not known.  What increases the risk?  You are more likely to develop this condition if:  · You smoke.  · You are older.  · You have diabetes.  · You are overweight.  · You have a family history of this condition.  · You exercise often and hard.  What are the signs or symptoms?  Common symptoms of this condition include:  · A feeling like your heart is beating very fast.  · Chest pain.  · Feeling short of breath.  · Feeling light-headed or weak.  · Getting tired easily.  Follow these instructions at home:  Medicines  · Take over-the-counter and prescription medicines only as told by your doctor.  · If your doctor gives you a blood-thinning medicine, take it exactly as told. Taking too much of it can cause bleeding. Taking too little of it does not protect you against clots. Clots can cause a stroke.  Lifestyle         · Do not use any tobacco products. These include cigarettes, chewing tobacco, and e-cigarettes. If you need help quitting, ask your doctor.  · Do not drink alcohol.  · Do not drink beverages that have caffeine. These include coffee, soda, and tea.  · Follow diet instructions as told by your doctor.  · Exercise regularly as told by your doctor.  General  instructions  · If you have a condition that causes breathing to stop for a short period of time (apnea), treat it as told by your doctor.  · Keep a healthy weight. Do not use diet pills unless your doctor says they are safe for you. Diet pills may make heart problems worse.  · Keep all follow-up visits as told by your doctor. This is important.  Contact a doctor if:  · You notice a change in the speed, rhythm, or strength of your heartbeat.  · You are taking a blood-thinning medicine and you see more bruising.  · You get tired more easily when you move or exercise.  · You have a sudden change in weight.  Get help right away if:    · You have pain in your chest or your belly (abdomen).  · You have trouble breathing.  · You have blood in your vomit, poop, or pee (urine).  · You have any signs of a stroke. "BE FAST" is an easy way to remember the main warning signs:  ? B - Balance. Signs are dizziness, sudden trouble walking, or loss of balance.  ? E - Eyes. Signs are trouble seeing or a change in how you see.  ? F - Face. Signs are sudden weakness   or loss of feeling in the face, or the face or eyelid drooping on one side.  ? A - Arms. Signs are weakness or loss of feeling in an arm. This happens suddenly and usually on one side of the body.  ? S - Speech. Signs are sudden trouble speaking, slurred speech, or trouble understanding what people say.  ? T - Time. Time to call emergency services. Write down what time symptoms started.  · You have other signs of a stroke, such as:  ? A sudden, very bad headache with no known cause.  ? Feeling sick to your stomach (nausea).  ? Throwing up (vomiting).  ? Jerky movements you cannot control (seizure).  These symptoms may be an emergency. Do not wait to see if the symptoms will go away. Get medical help right away. Call your local emergency services (911 in the [place] Do not drive yourself to the hospital.  Summary  · Atrial fibrillation is a type of heartbeat that is irregular  or fast (rapid).  · You are at higher risk of this condition if you smoke, are older, have diabetes, or are overweight.  · Follow your doctor's instructions about medicines, diet, exercise, and follow-up visits.  · Get help right away if you think that you have signs of a stroke.  This information is not intended to replace advice given to you by your health care provider. Make sure you discuss any questions you have with your health care provider.  Document Released: 08/10/2008 Document Revised: 12/23/2017 Document Reviewed: 12/23/2017  Elsevier Interactive Patient Education © 2019 Elsevier Inc.

## 2018-11-23 NOTE — Progress Notes (Signed)
BP 136/65   Pulse 63   Temp 98 F (36.7 C) (Oral)   Wt 173 lb (78.5 kg)   SpO2 95%   BMI 26.15 kg/m    Subjective:    Patient ID: Carlos Richard, male    DOB: 04-08-1932, 85 y.o.   MRN: [id number]  HPI: Carlos Richard is a 85 y.o. male presents for surgical clearance  Chief Complaint  Patient presents with  . Surgery Clearance   SURGICAL CLEARANCE: Patient is here for surgical clearance. Is having unilateral Medtronic DBS generator change on 12/05/2018.  Last saw Dr[facility] Cheri [facility]uppyMurrow [facility] on 10[facility]/18.  Reports he has upcoming preop and anesthesia appointment in one week.  No concerns about the surgery. He will be released to go home after [facility]. He does not smoke or drink alcohol. No cardiac or neurologic symptoms at this time.  EKG today comparable to previous EKG on review.  Is followed by [facility]. Park [facility]edKahn [facility] and was last seen 10/09/18 with echo performed with EF 81%.  Is on Xarelto for a-fib, he will discuss with surgeon if this needs to be held prior to surgery.  If recommend by surgeon to hold, then may hold medication.     Relevant past medical, surgical, family and social history reviewed and updated as indicated. Interim medical history since our last visit reviewed. Allergies and medications reviewed and updated.  Review of Systems  Constitutional: Negative for activity change, diaphoresis, fatigue and fever.  Respiratory: Negative for cough, chest tightness, shortness of breath and wheezing.   Cardiovascular: Negative for chest pain, palpitations and leg swelling.  Gastrointestinal: Negative for abdominal distention, abdominal pain, constipation, diarrhea, nausea and vomiting.  Endocrine: Negative for cold intolerance, heat intolerance, polydipsia, polyphagia and polyuria.  Musculoskeletal: Negative.   Skin: Negative.   Neurological: Negative for dizziness, syncope, weakness, light-headedness, numbness and  headaches.  Psychiatric/Behavioral: Negative.     Per HPI unless specifically indicated above     Objective:    BP 136/65   Pulse 63   Temp 98 F (36.7 C) (Oral)   Wt 173 lb (78.5 kg)   SpO2 95%   BMI 26.15 kg/m   Wt Readings from Last 3 Encounters:  11/23/18 173 lb (78.5 kg)  08/03/18 172 lb (78 kg)  10/31/17 170 lb (77.1 kg)    Physical Exam Vitals signs and nursing note reviewed.  Constitutional:      Appearance: He is well-developed.  HENT:     Head: Normocephalic and atraumatic.     Right Ear: Hearing normal. No drainage.     Left Ear: Hearing normal. No drainage.     Mouth/Throat:     Pharynx: Uvula midline.  Eyes:     General: Lids are normal.        Right eye: No discharge.        Left eye: No discharge.     Conjunctiva/sclera: Conjunctivae normal.     Pupils: Pupils are equal, round, and reactive to light.  Neck:     Musculoskeletal: Normal range of motion and neck supple.     Thyroid: No thyromegaly.     Vascular: No carotid bruit or JVD.     Trachea: Trachea normal.  Cardiovascular:     Rate and Rhythm: Normal rate and regular rhythm.     Heart sounds: Normal heart sounds, S1 normal and S2 normal. No murmur. No gallop.   Pulmonary:  Effort: Pulmonary effort is normal.     Breath sounds: Normal breath sounds.  Abdominal:     General: Bowel sounds are normal.     Palpations: Abdomen is soft. There is no hepatomegaly or splenomegaly.  Genitourinary:    Penis: Normal.      Rectum: Normal.  Musculoskeletal: Normal range of motion.  Skin:    General: Skin is warm and dry.     Capillary Refill: Capillary refill takes less than 2 seconds.     Findings: No rash.  Neurological:     Mental Status: He is alert and oriented to person, place, and time.     Deep Tendon Reflexes: Reflexes are normal and symmetric.  Psychiatric:        Behavior: Behavior normal.        Thought Content: Thought content normal.        Judgment: Judgment normal.   EKG  noting sinus bradycardia with rate of 54 (rate controlled a-fib on Toprol XL) and left axis deviation (noted on previous EKGs)  Results for orders placed or performed in visit on 08/03/18  CBC with Differential/Platelet  Result Value Ref Range   WBC 9.2 3.4 - 10.8 x10E3/uL   RBC 4.76 4.14 - 5.80 x10E6/uL   Hemoglobin 14.3 13.0 - 17.7 g/dL   Hematocrit 38.7 56.4 - 51.0 %   MCV 91 79 - 97 fL   MCH 30.0 26.6 - 33.0 pg   MCHC 33.0 31.5 - 35.7 g/dL   RDW 33.2 95.1 - 88.4 %   Platelets 193 150 - 450 x10E3/uL   Neutrophils 56 Not Estab. %   Lymphs 32 Not Estab. %   Monocytes 8 Not Estab. %   Eos 3 Not Estab. %   Basos 1 Not Estab. %   Neutrophils Absolute 5.1 1.4 - 7.0 x10E3/uL   Lymphocytes Absolute 3.0 0.7 - 3.1 x10E3/uL   Monocytes Absolute 0.7 0.1 - 0.9 x10E3/uL   EOS (ABSOLUTE) 0.3 0.0 - 0.4 x10E3/uL   Basophils Absolute 0.1 0.0 - 0.2 x10E3/uL   Immature Granulocytes 0 Not Estab. %   Immature Grans (Abs) 0.0 0.0 - 0.1 x10E3/uL  Comprehensive metabolic panel  Result Value Ref Range   Glucose 99 65 - 99 mg/dL   BUN 12 8 - 27 mg/dL   Creatinine, Ser 1.66 (H) 0.76 - 1.27 mg/dL   GFR calc non Af Amer 50 (L) >59 mL/min/1.73   GFR calc Af Amer 57 (L) >59 mL/min/1.73   BUN/Creatinine Ratio 9 (L) 10 - 24   Sodium 145 (H) 134 - 144 mmol/L   Potassium 4.6 3.5 - 5.2 mmol/L   Chloride 105 96 - 106 mmol/L   CO2 25 20 - 29 mmol/L   Calcium 9.4 8.6 - 10.2 mg/dL   Total Protein 6.6 6.0 - 8.5 g/dL   Albumin 4.4 3.5 - 4.7 g/dL   Globulin, Total 2.2 1.5 - 4.5 g/dL   Albumin/Globulin Ratio 2.0 1.2 - 2.2   Bilirubin Total 0.8 0.0 - 1.2 mg/dL   Alkaline Phosphatase 64 39 - 117 IU/L   AST 24 0 - 40 IU/L   ALT 21 0 - 44 IU/L  Lipid Panel w/o Chol/HDL Ratio  Result Value Ref Range   Cholesterol, Total 129 100 - 199 mg/dL   Triglycerides 063 0 - 149 mg/dL   HDL 40 >01 mg/dL   VLDL Cholesterol Cal 27 5 - 40 mg/dL   LDL Calculated 62 0 - 99 mg/dL  Microalbumin, Urine Waived  Result Value Ref  Range   Microalb, Ur Waived 80 (H) 0 - 19 mg/L   Creatinine, Urine Waived 200 10 - 300 mg/dL   Microalb/Creat Ratio 30-300 (H) <30 mg/g  UA/M w/rflx Culture, Routine  Result Value Ref Range   Specific Gravity, UA 1.015 1.005 - 1.030   pH, UA 7.5 5.0 - 7.5   Color, UA Orange Yellow   Appearance Ur Clear Clear   Leukocytes, UA Negative Negative   Protein, UA Trace (A) Negative/Trace   Glucose, UA Negative Negative   Ketones, UA Negative Negative   RBC, UA Negative Negative   Bilirubin, UA Negative Negative   Urobilinogen, Ur 0.2 0.2 - 1.0 mg/dL   Nitrite, UA Negative Negative  TSH  Result Value Ref Range   TSH 1.350 0.450 - 4.500 uIU/mL  Uric acid  Result Value Ref Range   Uric Acid 6.9 3.7 - 8.6 mg/dL      Assessment & Plan:   Problem List Items Addressed This Visit      Other   Preoperative clearance - Primary    EKG remains similar to previous on review.  Denies cardiac, pulmonary, or neuro symptoms at this time.  Will obtain CBC, BMP, INR (1.3 today).  If within normal or baseline range will clear for surgery.        Relevant Orders   EKG 12-Lead (Completed)   CBC with Differential/Platelet   Basic Metabolic Panel (BMET)   INR/PT       Follow up plan: Return if symptoms worsen or fail to improve.

## 2018-11-24 LAB — CBC WITH DIFFERENTIAL/PLATELET
Basophils Absolute: 0 10*3/uL (ref 0.0–0.2)
Basos: 0 %
EOS (ABSOLUTE): 0.1 10*3/uL (ref 0.0–0.4)
EOS: 1 %
Hematocrit: 39.4 % (ref 37.5–51.0)
Hemoglobin: 13.7 g/dL (ref 13.0–17.7)
Immature Grans (Abs): 0 10*3/uL (ref 0.0–0.1)
Immature Granulocytes: 0 %
LYMPHS ABS: 1.6 10*3/uL (ref 0.7–3.1)
Lymphs: 16 %
MCH: 30.5 pg (ref 26.6–33.0)
MCHC: 34.8 g/dL (ref 31.5–35.7)
MCV: 88 fL (ref 79–97)
MONOS ABS: 0.4 10*3/uL (ref 0.1–0.9)
Monocytes: 4 %
Neutrophils Absolute: 8.2 10*3/uL — ABNORMAL HIGH (ref 1.4–7.0)
Neutrophils: 79 %
PLATELETS: 194 10*3/uL (ref 150–450)
RBC: 4.49 x10E6/uL (ref 4.14–5.80)
RDW: 12.8 % (ref 11.6–15.4)
WBC: 10.4 10*3/uL (ref 3.4–10.8)

## 2018-11-24 LAB — BASIC METABOLIC PANEL
BUN / CREAT RATIO: 10 (ref 10–24)
BUN: 13 mg/dL (ref 8–27)
CO2: 24 mmol/L (ref 20–29)
Calcium: 9.4 mg/dL (ref 8.6–10.2)
Chloride: 102 mmol/L (ref 96–106)
Creatinine, Ser: 1.33 mg/dL — ABNORMAL HIGH (ref 0.76–1.27)
GFR, EST AFRICAN AMERICAN: 56 mL/min/{1.73_m2} — AB (ref >59)
GFR, EST NON AFRICAN AMERICAN: 49 mL/min/{1.73_m2} — AB (ref >59)
Glucose: 103 mg/dL — ABNORMAL HIGH (ref 65–99)
Potassium: 4 mmol/L (ref 3.5–5.2)
Sodium: 140 mmol/L (ref 134–144)

## 2018-12-04 ENCOUNTER — Encounter: Payer: Self-pay | Admitting: Family Medicine

## 2018-12-08 ENCOUNTER — Ambulatory Visit: Payer: Medicare Other | Admitting: Urology

## 2019-01-22 ENCOUNTER — Telehealth: Payer: Self-pay | Admitting: Family Medicine

## 2019-01-22 NOTE — Telephone Encounter (Signed)
Patient's wife will call back tomorrow

## 2019-01-22 NOTE — Telephone Encounter (Signed)
Called to schedule Medicare Annual Wellness Visit with the Nurse Health Advisor. No answer.  Left message on machine to call Misty Stanley at [phone].  If patient returns call, please schedule AWV-I  with [facility] any date AVAILABLE.  Patient is due Now.  Thank you! For any questions please contact: Trixie Rude at [phone] or Skype [email or web address]

## 2019-01-23 NOTE — Telephone Encounter (Signed)
Forwarding FYI

## 2019-01-23 NOTE — Telephone Encounter (Signed)
Spoke with Demi at the [facility] center and she had patient on the line, but it wouldn't let her schedule.  I got the AWV scheduled for 01/31/2019 at 8:15 am.  Demi states she will notify patient of the date and time.  Trixie Rude, Care Guide.

## 2019-01-30 ENCOUNTER — Telehealth: Payer: Self-pay | Admitting: Family Medicine

## 2019-01-30 NOTE — Telephone Encounter (Signed)
Spoke with wife Carney Bern because she states [name] is out of town and will not be back until tonight.  She was aware of the appointment date and time.  I gave her the change in location and directions to the back building, told her to let him know that he will check in and have his appointment in the back building.  Also, I let wife know to please tell him if he is not feeling well, please do not come for wellness visit and to cal the office and schedule an appointment.  Trixie Rude, Care Guide.

## 2019-01-31 ENCOUNTER — Other Ambulatory Visit: Payer: Self-pay

## 2019-01-31 ENCOUNTER — Ambulatory Visit (INDEPENDENT_AMBULATORY_CARE_PROVIDER_SITE_OTHER): Payer: Medicare Other

## 2019-01-31 VITALS — BP 160/64 | HR 67 | Temp 98.6°F | Resp 16 | Ht 70.0 in | Wt 171.8 lb

## 2019-01-31 DIAGNOSIS — Z0000 Encounter for general adult medical examination without abnormal findings: Secondary | ICD-10-CM

## 2019-01-31 NOTE — Progress Notes (Signed)
Subjective:   Carlos Richard is a 85 y.o. male who presents for Medicare Annual/Subsequent preventive examination.  Review of Systems:   Cardiac Risk Factors include: advanced age (>61men, >53 women);hypertension;dyslipidemia;male gender     Objective:    Vitals: BP (!) 160/64 (BP Location: Left Arm, Patient Position: Sitting, Cuff Size: Normal)   Pulse 67   Temp 98.6 F (37 C) (Temporal)   Resp 16   Ht 5\' 10"  (1.778 m)   Wt 171 lb 12.8 oz (77.9 kg)   BMI 24.65 kg/m   Body mass index is 24.65 kg/m.  Advanced Directives 01/31/2019 07/03/2015 04/29/2015  Does Patient Have a Medical Advance Directive? No No No  Would patient like information on creating a medical advance directive? No - Patient declined No - patient declined information No - patient declined information    Tobacco Social History   Tobacco Use  Smoking Status Former Smoker  . Types: Cigarettes  . Last attempt to quit: 1980  . Years since quitting: 40.2  Smokeless Tobacco Never Used  Tobacco Comment   quit 40 years ago      Counseling given: Not Answered Comment: quit 40 years ago    Clinical Intake:  Pre-visit preparation completed: Yes  Pain : No/denies pain     Nutritional Status: BMI of 19-24  Normal Nutritional Risks: None Diabetes: No  How often do you need to have someone help you when you read instructions, pamphlets, or other written materials from your doctor or pharmacy?: 1 - Never What is the last grade level you completed in school?: associates degree   Interpreter Needed?: No  Information entered by :: Merridith Dershem,LPN   Past Medical History:  Diagnosis Date  . Atrial fibrillation (HCC)   . COPD (chronic obstructive pulmonary disease) (HCC)   . Hypertension   . Near syncope   . Tremors of nervous system    Past Surgical History:  Procedure Laterality Date  . BACK SURGERY    . BRAIN SURGERY     stimulator placement for tremors   Family History  Problem Relation  Age of Onset  . Tuberculosis Mother   . Tremor Paternal Grandmother   . Obesity Sister   . Prostate cancer Neg Hx   . Bladder Cancer Neg Hx   . Kidney cancer Neg Hx    Social History   Socioeconomic History  . Marital status: Married    Spouse name: Not on file  . Number of children: Not on file  . Years of education: Not on file  . Highest education level: Associate degree: academic program  Occupational History  . Not on file  Social Needs  . Financial resource strain: Not hard at all  . Food insecurity:    Worry: Never true    Inability: Never true  . Transportation needs:    Medical: No    Non-medical: No  Tobacco Use  . Smoking status: Former Smoker    Types: Cigarettes    Last attempt to quit: 1980    Years since quitting: 40.2  . Smokeless tobacco: Never Used  . Tobacco comment: quit 40 years ago   Substance and Sexual Activity  . Alcohol use: Yes    Comment: occasionally  . Drug use: No  . Sexual activity: Yes  Lifestyle  . Physical activity:    Days per week: 0 days    Minutes per session: 0 min  . Stress: Not at all  Relationships  . Social connections:  Talks on phone: More than three times a week    Gets together: More than three times a week    Attends religious service: Never    Active member of club or organization: No    Attends meetings of clubs or organizations: Never    Relationship status: Married  Other Topics Concern  . Not on file  Social History Narrative  . Not on file    Outpatient Encounter Medications as of 01/31/2019  Medication Sig  . finasteride (PROSCAR) 5 MG tablet Take 1 tablet (5 mg total) by mouth daily.  . Fluticasone-Salmeterol (ADVAIR DISKUS) 250-50 MCG/DOSE AEPB Inhale 1 puff into the lungs 2 (two) times daily.  . furosemide (LASIX) 20 MG tablet Take 20 mg by mouth daily.   Marland Kitchen losartan (COZAAR) 50 MG tablet Take 1 tablet (50 mg total) by mouth daily.  . magnesium oxide (MAG-OX) 400 MG tablet Take 400 mg by mouth  daily.  . Multiple Vitamins-Minerals (ZINC PO) Take 1 tablet by mouth daily.  . simvastatin (ZOCOR) 20 MG tablet Take 1 tablet (20 mg total) by mouth daily.  . TOPROL XL 25 MG 24 hr tablet Take 1 tablet (25 mg total) by mouth daily.  Marland Kitchen UNABLE TO FIND CuraMed  . vitamin E 200 UNIT capsule Take 200 Units by mouth daily.  [name] 15 MG TABS tablet Take 15 mg by mouth daily with supper.   . Zinc 50 MG CAPS Take by mouth.  . terazosin (HYTRIN) 5 MG capsule Take 1 capsule (5 mg total) by mouth at bedtime. (Patient not taking: Reported on 01/31/2019)   No facility-administered encounter medications on file as of 01/31/2019.     Activities of Daily Living In your present state of health, do you have any difficulty performing the following activities: 01/31/2019 08/03/2018  Hearing? N N  Vision? Y Y  Difficulty concentrating or making decisions? N N  Walking or climbing stairs? Y Y  Comment knee pain Balance  Dressing or bathing? N N  Doing errands, shopping? N N  Preparing Food and eating ? N -  Using the Toilet? N -  In the past six months, have you accidently leaked urine? Y -  Comment urine leakage  -  Do you have problems with loss of bowel control? N -  Managing your Medications? N -  Managing your Finances? N -  Housekeeping or managing your Housekeeping? N -  Some recent data might be hidden    Patient Care Team: Dorcas Carrow, DO as PCP - General (Family Medicine)   Assessment:   This is a routine wellness examination for [name].  Exercise Activities and Dietary recommendations Current Exercise Habits: The patient does not participate in regular exercise at present, Exercise limited by: None identified  Goals   None     Fall Risk: Fall Risk  01/31/2019 08/03/2018  Falls in the past year? 0 No    FALL RISK PREVENTION PERTAINING TO THE HOME:  Any stairs in or around the home? Yes  If so, are there any without handrails? No   Home free of loose throw rugs in  walkways, pet beds, electrical cords, etc? No  Adequate lighting in your home to reduce risk of falls? Yes   ASSISTIVE DEVICES UTILIZED TO PREVENT FALLS:  Life alert? No  Use of a cane, walker or w/c? No  Grab bars in the bathroom? No  Shower chair or bench in shower? No  Elevated toilet seat or a handicapped  toilet? No   TIMED UP AND GO:  Was the test performed? Yes .  Length of time to ambulate 10 feet: 11 sec.   GAIT:  Appearance of gait: Gait steady and fast without the use of an assistive device.  Education: Fall risk prevention has been discussed.  Intervention(s) required? No  DME/home health order needed?  No   Depression Screen PHQ 2/9 Scores 01/31/2019 08/03/2018  PHQ - 2 Score 0 0    Cognitive Function     6CIT Screen 01/31/2019  What Year? 0 points  What month? 0 points  What time? 0 points  Count back from 20 0 points  Months in reverse 0 points  Repeat phrase 0 points  Total Score 0    Immunization History  Administered Date(s) Administered  . Influenza-Unspecified 08/29/2018  . Td 04/29/2015  . Zoster Recombinat (Shingrix) 05/27/2018, 08/25/2018    Qualifies for Shingles Vaccine? Yes  completed shingrix series   Tdap: up to date   Flu Vaccine: up to date   Pneumococcal Vaccine: Due for Pneumococcal vaccine.Patient unsure of when he had pneumococcal vaccines, recommend a booster as he has not had one in the last 5 years. He will receive this at his physical on the 31st if Dr.Johnson is in agreement to this plan.   Screening Tests Health Maintenance  Topic Date Due  . PNA vac Low Risk Adult (1 of 2 - PCV13) 02/04/1999  . TETANUS/TDAP  04/28/2025  . INFLUENZA VACCINE  Completed   Cancer Screenings:  Colorectal Screening: no longer required   Lung Cancer Screening: (Low Dose CT Chest recommended if Age 80-80 years, 30 pack-year currently smoking OR have quit w/in 15years.) does not qualify.    Additional Screening:  Hepatitis C  Screening: does not qualify  Vision Screening: Recommended annual ophthalmology exams for early detection of glaucoma and other disorders of the eye. Is the patient up to date with their annual eye exam?  No  Who is the provider or what is the name of the office in which the pt attends annual eye exams? Provided with local eye drs.    Dental Screening: Recommended annual dental exams for proper oral hygiene  Community Resource Referral:  CRR required this visit?  No        Plan:  I have personally reviewed and addressed the Medicare Annual Wellness questionnaire and have noted the following in the patient's chart:  A. Medical and social history B. Use of alcohol, tobacco or illicit drugs  C. Current medications and supplements D. Functional ability and status E.  Nutritional status F.  Physical activity G. Advance directives H. List of other physicians I.  Hospitalizations, surgeries, and ER visits in previous 12 months J.  Vitals K. Screenings such as hearing and vision if needed, cognitive and depression L. Referrals and appointments   In addition, I have reviewed and discussed with patient certain preventive protocols, quality metrics, and best practice recommendations. A written personalized care plan for preventive services as well as general preventive health recommendations were provided to patient.   Signed,   Collene Schlichter, LPN  1/61/0960 Nurse Health Advisor   Nurse Notes: none

## 2019-01-31 NOTE — Patient Instructions (Signed)
[name] , Thank you for taking time to come for your Medicare Wellness Visit. I appreciate your ongoing commitment to your health goals. Please review the following plan we discussed and let me know if I can assist you in the future.   Screening recommendations/referrals: Colonoscopy:  No longer required  Recommended yearly ophthalmology/optometry visit for glaucoma screening and checkup Recommended yearly dental visit for hygiene and checkup  Vaccinations: Influenza vaccine: up to date Pneumococcal vaccine: recommend booster at appt on 31st with Dr.Johnson Tdap vaccine: up to date  Shingles vaccine: up to date    Advanced directives: Advance directive discussed with you today. Even though you declined this today please call our office should you change your mind and we can give you the proper paperwork for you to fill out.  Conditions/risks identified: COPD  Next appointment: Follow up in one year for your annual wellness exam.   Preventive Care 65 Years and Older, Male Preventive care refers to lifestyle choices and visits with your health care provider that can promote health and wellness. What does preventive care include?  A yearly physical exam. This is also called an annual well check.  Dental exams once or twice a year.  Routine eye exams. Ask your health care provider how often you should have your eyes checked.  Personal lifestyle choices, including:  Daily care of your teeth and gums.  Regular physical activity.  Eating a healthy diet.  Avoiding tobacco and drug use.  Limiting alcohol use.  Practicing safe sex.  Taking low doses of aspirin every day.  Taking vitamin and mineral supplements as recommended by your health care provider. What happens during an annual well check? The services and screenings done by your health care provider during your annual well check will depend on your age, overall health, lifestyle risk factors, and family history of  disease. Counseling  Your health care provider may ask you questions about your:  Alcohol use.  Tobacco use.  Drug use.  Emotional well-being.  Home and relationship well-being.  Sexual activity.  Eating habits.  History of falls.  Memory and ability to understand (cognition).  Work and work Astronomer. Screening  You may have the following tests or measurements:  Height, weight, and BMI.  Blood pressure.  Lipid and cholesterol levels. These may be checked every 5 years, or more frequently if you are over 26 years old.  Skin check.  Lung cancer screening. You may have this screening every year starting at age 52 if you have a 30-pack-year history of smoking and currently smoke or have quit within the past 15 years.  Fecal occult blood test (FOBT) of the stool. You may have this test every year starting at age 23.  Flexible sigmoidoscopy or colonoscopy. You may have a sigmoidoscopy every 5 years or a colonoscopy every 10 years starting at age 79.  Prostate cancer screening. Recommendations will vary depending on your family history and other risks.  Hepatitis C blood test.  Hepatitis B blood test.  Sexually transmitted disease (STD) testing.  Diabetes screening. This is done by checking your blood sugar (glucose) after you have not eaten for a while (fasting). You may have this done every 1-3 years.  Abdominal aortic aneurysm (AAA) screening. You may need this if you are a current or former smoker.  Osteoporosis. You may be screened starting at age 19 if you are at high risk. Talk with your health care provider about your test results, treatment options, and if necessary,  the need for more tests. Vaccines  Your health care provider may recommend certain vaccines, such as:  Influenza vaccine. This is recommended every year.  Tetanus, diphtheria, and acellular pertussis (Tdap, Td) vaccine. You may need a Td booster every 10 years.  Zoster vaccine. You may  need this after age 90.  Pneumococcal 13-valent conjugate (PCV13) vaccine. One dose is recommended after age 66.  Pneumococcal polysaccharide (PPSV23) vaccine. One dose is recommended after age 12. Talk to your health care provider about which screenings and vaccines you need and how often you need them. This information is not intended to replace advice given to you by your health care provider. Make sure you discuss any questions you have with your health care provider. Document Released: 11/28/2015 Document Revised: 07/21/2016 Document Reviewed: 09/02/2015 Elsevier Interactive Patient Education  2017 [place] Prevention in the Home Falls can cause injuries. They can happen to people of all ages. There are many things you can do to make your home safe and to help prevent falls. What can I do on the outside of my home?  Regularly fix the edges of walkways and driveways and fix any cracks.  Remove anything that might make you trip as you walk through a door, such as a raised step or threshold.  Trim any bushes or trees on the path to your home.  Use bright outdoor lighting.  Clear any walking paths of anything that might make someone trip, such as rocks or tools.  Regularly check to see if handrails are loose or broken. Make sure that both sides of any steps have handrails.  Any raised decks and porches should have guardrails on the edges.  Have any leaves, snow, or ice cleared regularly.  Use sand or salt on walking paths during winter.  Clean up any spills in your garage right away. This includes oil or grease spills. What can I do in the bathroom?  Use night lights.  Install grab bars by the toilet and in the tub and shower. Do not use towel bars as grab bars.  Use non-skid mats or decals in the tub or shower.  If you need to sit down in the shower, use a plastic, non-slip stool.  Keep the floor dry. Clean up any water that spills on the floor as soon as it  happens.  Remove soap buildup in the tub or shower regularly.  Attach bath mats securely with double-sided non-slip rug tape.  Do not have throw rugs and other things on the floor that can make you trip. What can I do in the bedroom?  Use night lights.  Make sure that you have a light by your bed that is easy to reach.  Do not use any sheets or blankets that are too big for your bed. They should not hang down onto the floor.  Have a firm chair that has side arms. You can use this for support while you get dressed.  Do not have throw rugs and other things on the floor that can make you trip. What can I do in the kitchen?  Clean up any spills right away.  Avoid walking on wet floors.  Keep items that you use a lot in easy-to-reach places.  If you need to reach something above you, use a strong step stool that has a grab bar.  Keep electrical cords out of the way.  Do not use floor polish or wax that makes floors slippery. If you must use  wax, use non-skid floor wax.  Do not have throw rugs and other things on the floor that can make you trip. What can I do with my stairs?  Do not leave any items on the stairs.  Make sure that there are handrails on both sides of the stairs and use them. Fix handrails that are broken or loose. Make sure that handrails are as long as the stairways.  Check any carpeting to make sure that it is firmly attached to the stairs. Fix any carpet that is loose or worn.  Avoid having throw rugs at the top or bottom of the stairs. If you do have throw rugs, attach them to the floor with carpet tape.  Make sure that you have a light switch at the top of the stairs and the bottom of the stairs. If you do not have them, ask someone to add them for you. What else can I do to help prevent falls?  Wear shoes that:  Do not have high heels.  Have rubber bottoms.  Are comfortable and fit you well.  Are closed at the toe. Do not wear sandals.  If you  use a stepladder:  Make sure that it is fully opened. Do not climb a closed stepladder.  Make sure that both sides of the stepladder are locked into place.  Ask someone to hold it for you, if possible.  Clearly mark and make sure that you can see:  Any grab bars or handrails.  First and last steps.  Where the edge of each step is.  Use tools that help you move around (mobility aids) if they are needed. These include:  Canes.  Walkers.  Scooters.  Crutches.  Turn on the lights when you go into a dark area. Replace any light bulbs as soon as they burn out.  Set up your furniture so you have a clear path. Avoid moving your furniture around.  If any of your floors are uneven, fix them.  If there are any pets around you, be aware of where they are.  Review your medicines with your doctor. Some medicines can make you feel dizzy. This can increase your chance of falling. Ask your doctor what other things that you can do to help prevent falls. This information is not intended to replace advice given to you by your health care provider. Make sure you discuss any questions you have with your health care provider. Document Released: 08/28/2009 Document Revised: 04/08/2016 Document Reviewed: 12/06/2014 Elsevier Interactive Patient Education  2017 [employer].

## 2019-02-05 ENCOUNTER — Encounter: Payer: Medicare Other | Admitting: Family Medicine

## 2019-02-13 ENCOUNTER — Ambulatory Visit (INDEPENDENT_AMBULATORY_CARE_PROVIDER_SITE_OTHER): Payer: Medicare Other | Admitting: Family Medicine

## 2019-02-13 ENCOUNTER — Encounter: Payer: Self-pay | Admitting: Family Medicine

## 2019-02-13 VITALS — BP 145/86 | HR 52 | Temp 97.2°F | Wt 165.0 lb

## 2019-02-13 DIAGNOSIS — N183 Chronic kidney disease, stage 3 unspecified: Secondary | ICD-10-CM

## 2019-02-13 DIAGNOSIS — I4891 Unspecified atrial fibrillation: Secondary | ICD-10-CM

## 2019-02-13 DIAGNOSIS — I129 Hypertensive chronic kidney disease with stage 1 through stage 4 chronic kidney disease, or unspecified chronic kidney disease: Secondary | ICD-10-CM | POA: Diagnosis not present

## 2019-02-13 DIAGNOSIS — N401 Enlarged prostate with lower urinary tract symptoms: Secondary | ICD-10-CM

## 2019-02-13 DIAGNOSIS — J439 Emphysema, unspecified: Secondary | ICD-10-CM | POA: Diagnosis not present

## 2019-02-13 DIAGNOSIS — M1A9XX1 Chronic gout, unspecified, with tophus (tophi): Secondary | ICD-10-CM

## 2019-02-13 DIAGNOSIS — E782 Mixed hyperlipidemia: Secondary | ICD-10-CM

## 2019-02-13 DIAGNOSIS — N138 Other obstructive and reflux uropathy: Secondary | ICD-10-CM

## 2019-02-13 MED ORDER — SIMVASTATIN 20 MG PO TABS: 20.0000 mg | ORAL_TABLET | Freq: Every day | ORAL | 1 refills | Status: DC

## 2019-02-13 MED ORDER — FLUTICASONE-SALMETEROL 250-50 MCG/DOSE IN AEPB: 1.0000 | INHALATION_SPRAY | Freq: Two times a day (BID) | RESPIRATORY_TRACT | 3 refills | Status: DC

## 2019-02-13 MED ORDER — FINASTERIDE 5 MG PO TABS: 5.0000 mg | ORAL_TABLET | Freq: Every day | ORAL | 0 refills | Status: DC

## 2019-02-13 MED ORDER — LOSARTAN POTASSIUM 50 MG PO TABS: 50.0000 mg | ORAL_TABLET | Freq: Every day | ORAL | 1 refills | Status: DC

## 2019-02-13 MED ORDER — TERAZOSIN HCL 5 MG PO CAPS: 5.0000 mg | ORAL_CAPSULE | Freq: Every day | ORAL | 0 refills | Status: DC

## 2019-02-13 MED ORDER — TOPROL XL 25 MG PO TB24: 25.0000 mg | ORAL_TABLET | Freq: Every day | ORAL | 1 refills | Status: DC

## 2019-02-13 NOTE — Assessment & Plan Note (Signed)
Feeling stable. Follows with urology. Continue to follow with them. Call with any concerns. Will give 1 90 supply of his medicine until he can follow up with urology.

## 2019-02-13 NOTE — Assessment & Plan Note (Signed)
Stable on current regimen. Continue current regimen. Continue to monitor. Call with any concerns. Refills given today.

## 2019-02-13 NOTE — Assessment & Plan Note (Signed)
Will get labs rechecked. Call with any concerns.

## 2019-02-13 NOTE — Assessment & Plan Note (Signed)
Under good control on current regimen. Continue current regimen. Continue to monitor. Call with any concerns. Refills given. Labs to be drawn.  

## 2019-02-13 NOTE — Progress Notes (Signed)
BP (!) 145/86   Pulse (!) 52   Temp (!) 97.2 F (36.2 C) (Oral)   Wt 165 lb (74.8 kg)   BMI 23.68 kg/m    Subjective:    Patient ID: Carlos Richard, male    DOB: October 04, 1932, 85 y.o.   MRN: [id number]  HPI: Carlos Richard is a 85 y.o. male  Chief Complaint  Patient presents with  . Follow-up  . Hypertension  . Weak knee  . TELEMEDICINE VISIT   HYPERTENSION / HYPERLIPIDEMIA Satisfied with current treatment? yes Duration of hypertension: chronic BP monitoring frequency: a few times a week BP medication side effects: no Past BP meds: losartan, metoprolol Duration of hyperlipidemia: chronic Cholesterol medication side effects: no Cholesterol supplements: none Past cholesterol medications: simvast Medication compliance: excellent compliance Aspirin: no Recent stressors: no Recurrent headaches: no Visual changes: no Palpitations: no Dyspnea: no Chest pain: no Lower extremity edema: no Dizzy/lightheaded: no  Has not had any gout flares. Has been feeling well.   He does note that his knees are a bit stiff- has trouble getting up from being on the ground without assistance. No issues moving around- only with getting up from the ground.   He has not followed up with Dr. Virl Diamond- needs refills on his BPH medicine, but now can't get in with them. No other concerns or complaints at this time.   Relevant past medical, surgical, family and social history reviewed and updated as indicated. Interim medical history since our last visit reviewed. Allergies and medications reviewed and updated.  Review of Systems  Constitutional: Negative.   Respiratory: Negative.   Cardiovascular: Negative.   Musculoskeletal: Positive for arthralgias. Negative for back pain, gait problem, joint swelling, myalgias, neck pain and neck stiffness.  Skin: Negative.   Neurological: Negative.   Psychiatric/Behavioral: Negative.     Per HPI unless specifically indicated above     Objective:     BP (!) 145/86   Pulse (!) 52   Temp (!) 97.2 F (36.2 C) (Oral)   Wt 165 lb (74.8 kg)   BMI 23.68 kg/m   Wt Readings from Last 3 Encounters:  02/13/19 165 lb (74.8 kg)  01/31/19 171 lb 12.8 oz (77.9 kg)  11/23/18 173 lb (78.5 kg)    Physical Exam Vitals signs and nursing note reviewed.  Pulmonary:     Effort: Pulmonary effort is normal. No respiratory distress.     Comments: Speaking in full sentences Neurological:     Mental Status: He is alert.  Psychiatric:        Mood and Affect: Mood normal.        Behavior: Behavior normal.        Thought Content: Thought content normal.        Judgment: Judgment normal.     Results for orders placed or performed in visit on 11/23/18  CBC with Differential/Platelet  Result Value Ref Range   WBC 10.4 3.4 - 10.8 x10E3/uL   RBC 4.49 4.14 - 5.80 x10E6/uL   Hemoglobin 13.7 13.0 - 17.7 g/dL   Hematocrit 43.2 76.1 - 51.0 %   MCV 88 79 - 97 fL   MCH 30.5 26.6 - 33.0 pg   MCHC 34.8 31.5 - 35.7 g/dL   RDW 47.0 92.9 - 57.4 %   Platelets 194 150 - 450 x10E3/uL   Neutrophils 79 Not Estab. %   Lymphs 16 Not Estab. %   Monocytes 4 Not Estab. %   Eos 1 Not Estab. %  Basos 0 Not Estab. %   Neutrophils Absolute 8.2 (H) 1.4 - 7.0 x10E3/uL   Lymphocytes Absolute 1.6 0.7 - 3.1 x10E3/uL   Monocytes Absolute 0.4 0.1 - 0.9 x10E3/uL   EOS (ABSOLUTE) 0.1 0.0 - 0.4 x10E3/uL   Basophils Absolute 0.0 0.0 - 0.2 x10E3/uL   Immature Granulocytes 0 Not Estab. %   Immature Grans (Abs) 0.0 0.0 - 0.1 x10E3/uL  Basic Metabolic Panel (BMET)  Result Value Ref Range   Glucose 103 (H) 65 - 99 mg/dL   BUN 13 8 - 27 mg/dL   Creatinine, Ser 4.131.33 (H) 0.76 - 1.27 mg/dL   GFR calc non Af Amer 49 (L) >59 mL/min/1.73   GFR calc Af Amer 56 (L) >59 mL/min/1.73   BUN/Creatinine Ratio 10 10 - 24   Sodium 140 134 - 144 mmol/L   Potassium 4.0 3.5 - 5.2 mmol/L   Chloride 102 96 - 106 mmol/L   CO2 24 20 - 29 mmol/L   Calcium 9.4 8.6 - 10.2 mg/dL  INR/PT  Result  Value Ref Range   INR 1.3 (H) 0.8 - 1.2   Prothrombin Time 16.2 (H) 9.1 - 12.0 sec      Assessment & Plan:   Problem List Items Addressed This Visit      Cardiovascular and Mediastinum   Atrial fibrillation (HCC) - Primary    Feeling stable. Follows with cardiology. Continue to follow with them. Call with any concerns.       Relevant Medications   losartan (COZAAR) 50 MG tablet   simvastatin (ZOCOR) 20 MG tablet   TOPROL XL 25 MG 24 hr tablet   terazosin (HYTRIN) 5 MG capsule   Other Relevant Orders   CBC with Differential/Platelet   Comprehensive metabolic panel   TSH     Respiratory   COPD (chronic obstructive pulmonary disease) (HCC)    Stable on current regimen. Continue current regimen. Continue to monitor. Call with any concerns. Refills given today.      Relevant Medications   Fluticasone-Salmeterol (ADVAIR DISKUS) 250-50 MCG/DOSE AEPB   Other Relevant Orders   CBC with Differential/Platelet   Comprehensive metabolic panel   TSH     Genitourinary   BPH with obstruction/lower urinary tract symptoms    Feeling stable. Follows with urology. Continue to follow with them. Call with any concerns. Will give 1 90 supply of his medicine until he can follow up with urology.      Relevant Medications   finasteride (PROSCAR) 5 MG tablet   Other Relevant Orders   CBC with Differential/Platelet   Comprehensive metabolic panel   TSH   UA/M w/rflx Culture, Routine   Benign hypertensive renal disease    Under good control on current regimen. Continue current regimen. Continue to monitor. Call with any concerns. Refills given. Labs to be drawn.        Relevant Orders   CBC with Differential/Platelet   Comprehensive metabolic panel   Microalbumin, Urine Waived   TSH   CKD (chronic kidney disease) stage 3, GFR 30-59 ml/min (HCC)    Will get labs rechecked. Call with any concerns.       Relevant Orders   CBC with Differential/Platelet   Comprehensive metabolic panel    TSH     Other   Gout    Under good control on current regimen. Continue current regimen. Continue to monitor. Call with any concerns. Refills given. Labs to be drawn.        Relevant Orders  CBC with Differential/Platelet   Comprehensive metabolic panel   TSH   Uric acid   Mixed hyperlipidemia    Under good control on current regimen. Continue current regimen. Continue to monitor. Call with any concerns. Refills given. Labs to be drawn.        Relevant Medications   losartan (COZAAR) 50 MG tablet   simvastatin (ZOCOR) 20 MG tablet   TOPROL XL 25 MG 24 hr tablet   terazosin (HYTRIN) 5 MG capsule   Other Relevant Orders   CBC with Differential/Platelet   Comprehensive metabolic panel   Lipid Panel w/o Chol/HDL Ratio   TSH       Follow up plan: Return in about 6 months (around 08/15/2019) for Physical.   . This visit was completed via telephone due to the restrictions of the COVID-19 pandemic. All issues as above were discussed and addressed but no physical exam was performed. If it was felt that the patient should be evaluated in the office, they were directed there. The patient verbally consented to this visit. Patient was unable to complete an audio/visual visit due to Lack of equipment. Due to the catastrophic nature of the COVID-19 pandemic, this visit was done through audio contact only. . Location of the patient: home . Location of the provider: work . Those involved with this call:  . Provider: Olevia Perches, DO . CMA: Sheilah Mins, CMA . Front Desk/Registration: Adela Ports  . Time spent on call: 25 minutes on the phone discussing health concerns

## 2019-02-13 NOTE — Assessment & Plan Note (Signed)
Feeling stable. Follows with cardiology. Continue to follow with them. Call with any concerns.

## 2019-02-16 ENCOUNTER — Telehealth: Payer: Self-pay | Admitting: Family Medicine

## 2019-02-16 NOTE — Telephone Encounter (Signed)
Copied from CRM [id number]. Topic: Quick Communication - See Telephone Encounter >> Feb 16, 2019  4:54 PM Terisa Starr wrote: CRM for notification. See Telephone encounter for: 02/16/19 Octavio Graves with [facility] called and said his insurance plan would like him to try Metoprolol [place] instead of TOPROL XL 25 MG 24 hr tablet. Please contact at her @ [phone].

## 2019-02-17 NOTE — Telephone Encounter (Signed)
Please make sure he knows that that is a 2x a day medicine rather than a 1x a day medicine. If he's OK with that, I'll send it through for him

## 2019-02-19 NOTE — Telephone Encounter (Signed)
Called and left a message for patient to return my call to let me know if he is ok to take 2 tablets a day instead of one.

## 2019-02-19 NOTE — Telephone Encounter (Signed)
Spoke with patient, this medication was written by Dr.Kahn, so they will contact him to get his opinion, and let us know what they are going to do.

## 2019-02-28 ENCOUNTER — Ambulatory Visit: Payer: Medicare Other

## 2019-05-23 ENCOUNTER — Telehealth: Payer: Self-pay | Admitting: Family Medicine

## 2019-05-23 MED ORDER — TERAZOSIN HCL 5 MG PO CAPS: 5.0000 mg | ORAL_CAPSULE | Freq: Every day | ORAL | 1 refills | Status: DC

## 2019-05-23 NOTE — Telephone Encounter (Signed)
Medication Refill - Medication: terazosin (HYTRIN) 5 MG capsule  Has the patient contacted their pharmacy? Yes.   (Agent: If no, request that the patient contact the pharmacy for the refill.) (Agent: If yes, when and what did the pharmacy advise?)  Preferred Pharmacy (with phone number or street name):  [facility] [address] [phone] (Phone) [phone] (Fax)     Agent: Please be advised that RX refills may take up to 3 business days. We ask that you follow-up with your pharmacy.

## 2019-05-28 ENCOUNTER — Telehealth: Payer: Self-pay | Admitting: Family Medicine

## 2019-05-28 MED ORDER — FINASTERIDE 5 MG PO TABS: 5.0000 mg | ORAL_TABLET | Freq: Every day | ORAL | 1 refills | Status: DC

## 2019-05-28 NOTE — Telephone Encounter (Signed)
Medication: simvastatin (ZOCOR) 20 MG tablet [[id number]] , finasteride (PROSCAR) 5 MG tablet [[id number]]   Has the patient contacted their pharmacy? Yes  (Agent: If no, request that the patient contact the pharmacy for the refill.) (Agent: If yes, when and what did the pharmacy advise?)  Preferred Pharmacy (with phone number or street name): [facility], [address] [place] [phone] (Phone) [phone] (Fax)    Agent: Please be advised that RX refills may take up to 3 business days. We ask that you follow-up with your pharmacy.

## 2019-05-28 NOTE — Telephone Encounter (Signed)
Finasteride was only a 3 month supply I think.

## 2019-05-28 NOTE — Telephone Encounter (Signed)
6 month supply sent in in March- not due for refill until September.

## 2019-08-14 ENCOUNTER — Other Ambulatory Visit: Payer: Self-pay

## 2019-08-14 ENCOUNTER — Ambulatory Visit (INDEPENDENT_AMBULATORY_CARE_PROVIDER_SITE_OTHER): Payer: Medicare Other

## 2019-08-14 DIAGNOSIS — Z23 Encounter for immunization: Secondary | ICD-10-CM | POA: Diagnosis not present

## 2019-08-15 NOTE — Telephone Encounter (Signed)
6 month supply sent in in July- should not be due

## 2019-08-15 NOTE — Telephone Encounter (Signed)
Called and left a message for patient's wife letting her know that it was sent to [facility].

## 2019-10-22 ENCOUNTER — Other Ambulatory Visit: Payer: Self-pay | Admitting: Family Medicine

## 2019-10-22 ENCOUNTER — Encounter: Payer: Self-pay | Admitting: Family Medicine

## 2019-10-22 MED ORDER — TOPROL XL 25 MG PO TB24: 25.0000 mg | ORAL_TABLET | Freq: Every day | ORAL | 1 refills | Status: DC

## 2019-10-22 NOTE — Telephone Encounter (Signed)
Pt. is here in office stating he needs RX of metoprolol  Tabs 25mg  for a month worth sent to medication to [facility].

## 2019-10-25 NOTE — Telephone Encounter (Signed)
Patient and wife called in stating their insurance will not cover the normal toprol that PCP had called in and they are needing the previous metoprolol tartrate (LOPRESSOR) 25 MG tablet filled instead. Please advise.

## 2019-10-26 NOTE — Telephone Encounter (Signed)
[facility]- where do [facility] medicine?

## 2019-10-26 NOTE — Addendum Note (Signed)
Addended by: Valerie Roys on: 10/26/2019 10:55 AM   Modules accepted: Orders

## 2019-10-26 NOTE — Telephone Encounter (Signed)
Routing to provider  

## 2019-10-26 NOTE — Telephone Encounter (Signed)
Called and spoke to patient's wife. Requesting medication be sent to [facility]. Will not use [facility] until next year.

## 2019-10-26 NOTE — Telephone Encounter (Signed)
[facility]. Where does he need the medicine sent?

## 2019-10-31 ENCOUNTER — Other Ambulatory Visit: Payer: Self-pay

## 2019-10-31 ENCOUNTER — Observation Stay
Admission: EM | Admit: 2019-10-31 | Discharge: 2019-11-01 | Disposition: A | Discharge status: Home / Self Care | Payer: Medicare Other | Attending: Emergency Medicine | Admitting: Emergency Medicine

## 2019-10-31 ENCOUNTER — Encounter: Payer: Self-pay | Admitting: Intensive Care

## 2019-10-31 ENCOUNTER — Observation Stay: Payer: Medicare Other

## 2019-10-31 DIAGNOSIS — J439 Emphysema, unspecified: Secondary | ICD-10-CM | POA: Diagnosis not present

## 2019-10-31 DIAGNOSIS — N138 Other obstructive and reflux uropathy: Secondary | ICD-10-CM | POA: Diagnosis not present

## 2019-10-31 DIAGNOSIS — N401 Enlarged prostate with lower urinary tract symptoms: Secondary | ICD-10-CM | POA: Insufficient documentation

## 2019-10-31 DIAGNOSIS — I4891 Unspecified atrial fibrillation: Secondary | ICD-10-CM | POA: Insufficient documentation

## 2019-10-31 DIAGNOSIS — J449 Chronic obstructive pulmonary disease, unspecified: Secondary | ICD-10-CM | POA: Diagnosis not present

## 2019-10-31 DIAGNOSIS — Z7951 Long term (current) use of inhaled steroids: Secondary | ICD-10-CM | POA: Insufficient documentation

## 2019-10-31 DIAGNOSIS — I10 Essential (primary) hypertension: Secondary | ICD-10-CM | POA: Diagnosis present

## 2019-10-31 DIAGNOSIS — I129 Hypertensive chronic kidney disease with stage 1 through stage 4 chronic kidney disease, or unspecified chronic kidney disease: Secondary | ICD-10-CM | POA: Insufficient documentation

## 2019-10-31 DIAGNOSIS — Z87891 Personal history of nicotine dependence: Secondary | ICD-10-CM | POA: Diagnosis not present

## 2019-10-31 DIAGNOSIS — R001 Bradycardia, unspecified: Secondary | ICD-10-CM | POA: Diagnosis not present

## 2019-10-31 DIAGNOSIS — Z79899 Other long term (current) drug therapy: Secondary | ICD-10-CM | POA: Insufficient documentation

## 2019-10-31 DIAGNOSIS — Z7901 Long term (current) use of anticoagulants: Secondary | ICD-10-CM | POA: Insufficient documentation

## 2019-10-31 DIAGNOSIS — Z20828 Contact with and (suspected) exposure to other viral communicable diseases: Secondary | ICD-10-CM | POA: Insufficient documentation

## 2019-10-31 DIAGNOSIS — N183 Chronic kidney disease, stage 3 unspecified: Secondary | ICD-10-CM | POA: Diagnosis present

## 2019-10-31 DIAGNOSIS — R55 Syncope and collapse: Principal | ICD-10-CM | POA: Insufficient documentation

## 2019-10-31 DIAGNOSIS — E782 Mixed hyperlipidemia: Secondary | ICD-10-CM | POA: Diagnosis not present

## 2019-10-31 DIAGNOSIS — N1831 Chronic kidney disease, stage 3a: Secondary | ICD-10-CM | POA: Insufficient documentation

## 2019-10-31 DIAGNOSIS — G250 Essential tremor: Secondary | ICD-10-CM | POA: Insufficient documentation

## 2019-10-31 LAB — COMPREHENSIVE METABOLIC PANEL
ALT: 20 U/L (ref 0–44)
AST: 25 U/L (ref 15–41)
Albumin: 3.7 g/dL (ref 3.5–5.0)
Alkaline Phosphatase: 44 U/L (ref 38–126)
Anion gap: 7 (ref 5–15)
BUN: 14 mg/dL (ref 8–23)
CO2: 26 mmol/L (ref 22–32)
Calcium: 8.7 mg/dL — ABNORMAL LOW (ref 8.9–10.3)
Chloride: 108 mmol/L (ref 98–111)
Creatinine, Ser: 1.14 mg/dL (ref 0.61–1.24)
GFR calc Af Amer: >60 mL/min (ref >60)
GFR calc non Af Amer: 58 mL/min — ABNORMAL LOW (ref >60)
Glucose, Bld: 137 mg/dL — ABNORMAL HIGH (ref 70–99)
Potassium: 4.3 mmol/L (ref 3.5–5.1)
Sodium: 141 mmol/L (ref 135–145)
Total Bilirubin: 0.8 mg/dL (ref 0.3–1.2)
Total Protein: 6.3 g/dL — ABNORMAL LOW (ref 6.5–8.1)

## 2019-10-31 LAB — URINALYSIS, COMPLETE (UACMP) WITH MICROSCOPIC
Bacteria, UA: NONE SEEN
Bilirubin Urine: NEGATIVE
Glucose, UA: NEGATIVE mg/dL
Hgb urine dipstick: NEGATIVE
Ketones, ur: NEGATIVE mg/dL
Leukocytes,Ua: NEGATIVE
Nitrite: NEGATIVE
Protein, ur: NEGATIVE mg/dL
Specific Gravity, Urine: 1.011 (ref 1.005–1.030)
Squamous Epithelial / HPF: NONE SEEN (ref 0–5)
pH: 7 (ref 5.0–8.0)

## 2019-10-31 LAB — CBC WITH DIFFERENTIAL/PLATELET
Abs Immature Granulocytes: 0.01 10*3/uL (ref 0.00–0.07)
Basophils Absolute: 0.1 10*3/uL (ref 0.0–0.1)
Basophils Relative: 1 %
Eosinophils Absolute: 0.3 10*3/uL (ref 0.0–0.5)
Eosinophils Relative: 3 %
HCT: 39.9 % (ref 39.0–52.0)
Hemoglobin: 13.3 g/dL (ref 13.0–17.0)
Immature Granulocytes: 0 %
Lymphocytes Relative: 40 %
Lymphs Abs: 3.3 10*3/uL (ref 0.7–4.0)
MCH: 30.6 pg (ref 26.0–34.0)
MCHC: 33.3 g/dL (ref 30.0–36.0)
MCV: 91.7 fL (ref 80.0–100.0)
Monocytes Absolute: 0.6 10*3/uL (ref 0.1–1.0)
Monocytes Relative: 7 %
Neutro Abs: 4 10*3/uL (ref 1.7–7.7)
Neutrophils Relative %: 49 %
Platelets: 167 10*3/uL (ref 150–400)
RBC: 4.35 MIL/uL (ref 4.22–5.81)
RDW: 12.9 % (ref 11.5–15.5)
WBC: 8.2 10*3/uL (ref 4.0–10.5)
nRBC: 0 % (ref 0.0–0.2)

## 2019-10-31 LAB — SARS CORONAVIRUS 2 (TAT 6-24 HRS): SARS Coronavirus 2: NEGATIVE

## 2019-10-31 LAB — TROPONIN I (HIGH SENSITIVITY)
Troponin I (High Sensitivity): 6 ng/L (ref <18)
Troponin I (High Sensitivity): 6 ng/L (ref <18)

## 2019-10-31 LAB — BRAIN NATRIURETIC PEPTIDE: B Natriuretic Peptide: 78 pg/mL (ref 0.0–100.0)

## 2019-10-31 LAB — LIPASE, BLOOD: Lipase: 39 U/L (ref 11–51)

## 2019-10-31 MED ORDER — MAGNESIUM OXIDE 400 (241.3 MG) MG PO TABS
400.0000 mg | ORAL_TABLET | Freq: Every day | ORAL | Status: DC
  Administered 2019-10-31: 400 mg via ORAL
  Filled 2019-10-31 (×3): qty 1

## 2019-10-31 MED ORDER — ACETAMINOPHEN 325 MG PO TABS: 650.0000 mg | ORAL_TABLET | Freq: Four times a day (QID) | ORAL | Status: DC | PRN

## 2019-10-31 MED ORDER — SODIUM CHLORIDE 0.9 % IV SOLN: Freq: Once | INTRAVENOUS | Status: AC

## 2019-10-31 MED ORDER — ONDANSETRON HCL 4 MG/2ML IJ SOLN
4.0000 mg | Freq: Once | INTRAMUSCULAR | Status: AC
  Administered 2019-10-31: 4 mg via INTRAVENOUS
  Filled 2019-10-31: qty 2

## 2019-10-31 MED ORDER — LOSARTAN POTASSIUM 50 MG PO TABS
50.0000 mg | ORAL_TABLET | Freq: Every day | ORAL | Status: DC
  Administered 2019-10-31: 50 mg via ORAL
  Filled 2019-10-31 (×2): qty 1

## 2019-10-31 MED ORDER — ACETAMINOPHEN 650 MG RE SUPP: 650.0000 mg | Freq: Four times a day (QID) | RECTAL | Status: DC | PRN

## 2019-10-31 MED ORDER — ZINC SULFATE 220 (50 ZN) MG PO CAPS
220.0000 mg | ORAL_CAPSULE | Freq: Every day | ORAL | Status: DC
  Administered 2019-10-31: 220 mg via ORAL
  Filled 2019-10-31 (×2): qty 1

## 2019-10-31 MED ORDER — RIVAROXABAN 15 MG PO TABS
15.0000 mg | ORAL_TABLET | Freq: Every day | ORAL | Status: DC
  Administered 2019-10-31: 15 mg via ORAL
  Filled 2019-10-31: qty 1

## 2019-10-31 MED ORDER — VITAMIN E 45 MG (100 UNIT) PO CAPS
200.0000 [IU] | ORAL_CAPSULE | Freq: Every day | ORAL | Status: DC
  Administered 2019-10-31: 200 [IU] via ORAL
  Filled 2019-10-31 (×2): qty 2

## 2019-10-31 MED ORDER — SIMVASTATIN 10 MG PO TABS
20.0000 mg | ORAL_TABLET | Freq: Every day | ORAL | Status: DC
  Administered 2019-10-31: 20 mg via ORAL
  Filled 2019-10-31 (×2): qty 2

## 2019-10-31 MED ORDER — HYDRALAZINE HCL 50 MG PO TABS: 25.0000 mg | ORAL_TABLET | Freq: Three times a day (TID) | ORAL | Status: DC | PRN

## 2019-10-31 MED ORDER — SODIUM CHLORIDE 0.9% FLUSH
3.0000 mL | Freq: Two times a day (BID) | INTRAVENOUS | Status: DC
  Administered 2019-10-31: 3 mL via INTRAVENOUS

## 2019-10-31 MED ORDER — ONDANSETRON HCL 4 MG PO TABS: 4.0000 mg | ORAL_TABLET | Freq: Four times a day (QID) | ORAL | Status: DC | PRN

## 2019-10-31 MED ORDER — FINASTERIDE 5 MG PO TABS
5.0000 mg | ORAL_TABLET | Freq: Every day | ORAL | Status: DC
  Administered 2019-10-31: 5 mg via ORAL
  Filled 2019-10-31 (×2): qty 1

## 2019-10-31 MED ORDER — ALLOPURINOL 100 MG PO TABS
100.0000 mg | ORAL_TABLET | ORAL | Status: DC
  Administered 2019-10-31: 18:00:00 100 mg via ORAL
  Filled 2019-10-31: qty 1

## 2019-10-31 MED ORDER — SODIUM CHLORIDE 0.9 % IV SOLN: INTRAVENOUS | Status: DC

## 2019-10-31 MED ORDER — ZINC 25 MG PO TABS: 1.0000 | ORAL_TABLET | Freq: Every day | ORAL | Status: DC

## 2019-10-31 MED ORDER — TERAZOSIN HCL 5 MG PO CAPS
5.0000 mg | ORAL_CAPSULE | Freq: Every day | ORAL | Status: DC
  Administered 2019-10-31: 5 mg via ORAL
  Filled 2019-10-31 (×2): qty 1

## 2019-10-31 MED ORDER — ONDANSETRON HCL 4 MG/2ML IJ SOLN: 4.0000 mg | Freq: Four times a day (QID) | INTRAMUSCULAR | Status: DC | PRN

## 2019-10-31 MED ORDER — ALBUTEROL SULFATE (2.5 MG/3ML) 0.083% IN NEBU: 2.5000 mg | INHALATION_SOLUTION | RESPIRATORY_TRACT | Status: DC | PRN

## 2019-10-31 NOTE — ED Notes (Signed)
Pt provided phone to talk to wife.

## 2019-10-31 NOTE — ED Notes (Signed)
Pt ambulatory to toilet to urinate. Steady gait noted.  

## 2019-10-31 NOTE — ED Notes (Signed)
Pt sitting on the side of the bed to eat supper.

## 2019-10-31 NOTE — ED Notes (Signed)
Pt taken to CT.

## 2019-10-31 NOTE — H&P (Signed)
History and Physical    Carlos Richard ZOX:[id number] DOB: December 18, 1931 DOA: 10/31/2019  Referring MD/NP/PA:   PCP: Dorcas Carrow, DO   Patient coming from:  The patient is coming from home.  At baseline, pt is independent for most of ADL.        Chief Complaint: near syncope  HPI: Carlos Richard is a 85 y.o. 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  Per report, pt has had three episodes of near syncope. He has generalized weakness and dizziness.  No unilateral numbness or tingling in his extremities. No facial droop or slurred speech. Patient states he did not actually pass out but he felt like he was going to several times. Per report, pt was pale and bradycardiac when EMS arrived with HR 40-50s.  Patient denies any chest pain, cough, shortness of breath, fever, chills.  He has nausea and vomited at least twice with nonbilious nonbloody vomitus.  Currently no nausea, vomiting, diarrhea or abdominal pain.  No symptoms of UTI.  ED Course: pt was found to have WBC 8.2, troponin 6, pending COVID-19 test (EDP ordered new respiratory virus panel test), stable renal function, temperature normal, blood pressure 128/62, heart rate 50-80s in ED, RR 22, O2 93% on RA currently.  Review of Systems:   General: no fevers, chills, no body weight gain, has fatigue HEENT: no blurry vision, hearing changes or sore throat Respiratory: no dyspnea, coughing, wheezing CV: no chest pain, no palpitations GI: no nausea, vomiting, abdominal pain, diarrhea, constipation GU: no dysuria, burning on urination, increased urinary frequency, hematuria  Ext: no leg edema Neuro: no unilateral weakness, numbness, or tingling, no vision change or hearing loss. Has near syncope and dizziness. Has tremor. Skin: no rash, no skin tear. MSK: No  muscle spasm, no deformity, no limitation of range of movement in spin Heme: No easy bruising.  Travel history: No recent long distant travel.  Allergy: No Known Allergies  Past Medical History:  Diagnosis Date  . Atrial fibrillation (HCC)   . COPD (chronic obstructive pulmonary disease) (HCC)   . Hypertension   . Near syncope   . Tremors of nervous system     Past Surgical History:  Procedure Laterality Date  . BACK SURGERY    . BRAIN SURGERY     stimulator placement for tremors    Social History:  reports that he quit smoking about 40 years ago. His smoking use included cigarettes. He has never used smokeless tobacco. He reports current alcohol use. He reports that he does not use drugs.  Family History:  Family History  Problem Relation Age of Onset  . Tuberculosis Mother   . Tremor Paternal Grandmother   . Obesity Sister   . Prostate cancer Neg Hx   . Bladder Cancer Neg Hx   . Kidney cancer Neg Hx      Prior to Admission medications   Medication Sig Start Date End Date Taking? Authorizing Provider  finasteride (PROSCAR) 5 MG tablet Take 1 tablet (5 mg total) by mouth daily. 05/28/19   Johnson, Megan P, DO  Fluticasone-Salmeterol (ADVAIR DISKUS) 250-50 MCG/DOSE AEPB Inhale 1 puff into the lungs 2 (two) times daily. 02/13/19   Johnson, Megan P, DO  furosemide (LASIX) 20 MG tablet Take 20 mg by mouth daily.  10/18/17   [provider]  losartan (COZAAR) 50 MG tablet  Take 1 tablet (50 mg total) by mouth daily. 02/13/19   Johnson, Megan P, DO  magnesium oxide (MAG-OX) 400 MG tablet Take 400 mg by mouth daily.    [provider]  Multiple Vitamins-Minerals (ZINC PO) Take 1 tablet by mouth daily.    [provider]  simvastatin (ZOCOR) 20 MG tablet Take 1 tablet (20 mg total) by mouth daily. 02/13/19   Johnson, Megan P, DO  terazosin (HYTRIN) 5 MG capsule Take 1 capsule (5 mg total) by mouth at bedtime. 05/23/19   Valerie Roys, DO  UNABLE TO FIND  [facility]    [provider]  vitamin E 200 UNIT capsule Take 200 Units by mouth daily.    [provider]  XARELTO 15 MG TABS tablet Take 15 mg by mouth daily with supper.  07/28/17   [provider]  Zinc 50 MG CAPS Take by mouth.    [provider]    Physical Exam: Vitals:   10/31/19 1200 10/31/19 1230 10/31/19 1300 10/31/19 1415  BP: 138/68 (!) 142/76 132/75   Pulse: (!) 44 61 66 72  Resp: 12 19 18 18   Temp:      TempSrc:      SpO2: 94% 93% 90% 94%  Weight:      Height:       General: Not in acute distress HEENT:       Eyes: PERRL, EOMI, no scleral icterus.       ENT: No discharge from the ears and nose, no pharynx injection, no tonsillar enlargement.        Neck: No JVD, no bruit, no mass felt. Heme: No neck lymph node enlargement. Cardiac: S1/S2, RRR, No murmurs, No gallops or rubs. Respiratory: No rales, wheezing, rhonchi or rubs. GI: Soft, nondistended, nontender, no rebound pain, no organomegaly, BS present. GU: No hematuria Ext: No pitting leg edema bilaterally. 2+DP/PT pulse bilaterally. Musculoskeletal: No joint deformities, No joint redness or warmth, no limitation of ROM in spin. Skin: No rashes.  Neuro: Alert, oriented X3, cranial nerves II-XII grossly intact, moves all extremities normally. Has essential tremor. Psych: Patient is not psychotic, no suicidal or hemocidal ideation.  Labs on Admission: I have personally reviewed following labs and imaging studies  CBC: Recent Labs  Lab 10/31/19 1007  WBC 8.2  NEUTROABS 4.0  HGB 13.3  HCT 39.9  MCV 91.7  PLT 161   Basic Metabolic Panel: Recent Labs  Lab 10/31/19 1007  NA 141  K 4.3  CL 108  CO2 26  GLUCOSE 137*  BUN 14  CREATININE 1.14  CALCIUM 8.7*   GFR: Estimated Creatinine Clearance: 47.4 mL/min (by C-G formula based on SCr of 1.14 mg/dL). Liver Function Tests: Recent Labs  Lab 10/31/19 1007  AST 25  ALT 20  ALKPHOS 44  BILITOT 0.8  PROT 6.3*   ALBUMIN 3.7   No results for input(s): LIPASE, AMYLASE in the last 168 hours. No results for input(s): AMMONIA in the last 168 hours. Coagulation Profile: No results for input(s): INR, PROTIME in the last 168 hours. Cardiac Enzymes: No results for input(s): CKTOTAL, CKMB, CKMBINDEX, TROPONINI in the last 168 hours. BNP (last 3 results) No results for input(s): PROBNP in the last 8760 hours. HbA1C: No results for input(s): HGBA1C in the last 72 hours. CBG: No results for input(s): GLUCAP in the last 168 hours. Lipid Profile: No results for input(s): CHOL, HDL, LDLCALC, TRIG, CHOLHDL, LDLDIRECT in the last 72 hours. Thyroid Function Tests: No  results for input(s): TSH, T4TOTAL, FREET4, T3FREE, THYROIDAB in the last 72 hours. Anemia Panel: No results for input(s): VITAMINB12, FOLATE, FERRITIN, TIBC, IRON, RETICCTPCT in the last 72 hours. Urine analysis:    Component Value Date/Time   COLORURINE YELLOW (A) 10/31/2019 1007   APPEARANCEUR HAZY (A) 10/31/2019 1007   APPEARANCEUR Clear 08/03/2018 1005   LABSPEC 1.011 10/31/2019 1007   LABSPEC 1.027 11/13/2013 1832   PHURINE 7.0 10/31/2019 1007   GLUCOSEU NEGATIVE 10/31/2019 1007   GLUCOSEU Negative 11/13/2013 1832   HGBUR NEGATIVE 10/31/2019 1007   BILIRUBINUR NEGATIVE 10/31/2019 1007   BILIRUBINUR Negative 08/03/2018 1005   BILIRUBINUR Negative 11/13/2013 1832   KETONESUR NEGATIVE 10/31/2019 1007   PROTEINUR NEGATIVE 10/31/2019 1007   NITRITE NEGATIVE 10/31/2019 1007   LEUKOCYTESUR NEGATIVE 10/31/2019 1007   LEUKOCYTESUR Negative 11/13/2013 1832   Sepsis Labs: @LABRCNTIP (procalcitonin:4,lacticidven:4) )No results found for this or any previous visit (from the past 240 hour(s)).   Radiological Exams on Admission: CT HEAD WO CONTRAST  Result Date: 10/31/2019 CLINICAL DATA:  Recurrent syncope. EXAM: CT HEAD WITHOUT CONTRAST TECHNIQUE: Contiguous axial images were obtained from the base of the skull through the vertex without  intravenous contrast. COMPARISON:  July 03, 2015 FINDINGS: Brain: A deep brain stimulator is again present from a left frontal approach, stable in position. No subdural, epidural, or subarachnoid hemorrhage. Cerebellum, brainstem, and basal cisterns are normal. Ventricles and sulci are stable. White matter changes are similar in the interval. No acute cortical ischemia or infarct. Vascular: No hyperdense vessel or unexpected calcification. Skull: Normal. Negative for fracture or focal lesion. Sinuses/Orbits: No acute finding. Other: None. IMPRESSION: Chronic changes in the brain. Stable deep brain stimulator. No acute intracranial abnormalities identified to explain the patient's syncope. Electronically Signed   By: Gerome Samavid  Williams III M.D   On: 10/31/2019 13:51   US Carotid Bilateral  Result Date: 10/31/2019 CLINICAL DATA:  85 year old male with syncope EXAM: BILATERAL CAROTID DUPLEX ULTRASOUND TECHNIQUE: Wallace CullensGray scale imaging, color Doppler and duplex ultrasound were performed of bilateral carotid and vertebral arteries in the neck. COMPARISON:  None. FINDINGS: Criteria: Quantification of carotid stenosis is based on velocity parameters that correlate the residual internal carotid diameter with NASCET-based stenosis levels, using the diameter of the distal internal carotid lumen as the denominator for stenosis measurement. The following velocity measurements were obtained: RIGHT ICA:  Systolic 100 cm/sec, Diastolic 26 cm/sec CCA:  91 cm/sec SYSTOLIC ICA/CCA RATIO:  1.1 ECA:  136 cm/sec LEFT ICA:  Systolic 83 cm/sec, Diastolic 26 cm/sec CCA:  93 cm/sec SYSTOLIC ICA/CCA RATIO:  0.9 ECA:  109 cm/sec Right Brachial SBP: Not acquired Left Brachial SBP: Not acquired RIGHT CAROTID ARTERY: No significant calcifications of the right common carotid artery. Intermediate waveform maintained. Moderate heterogeneous and partially calcified plaque at the right carotid bifurcation. No significant lumen shadowing. Low  resistance waveform of the right ICA. No significant tortuosity. RIGHT VERTEBRAL ARTERY: Antegrade flow with low resistance waveform. LEFT CAROTID ARTERY: No significant calcifications of the left common carotid artery. Intermediate waveform maintained. Moderate heterogeneous and partially calcified plaque at the left carotid bifurcation. No significant lumen shadowing. Low resistance waveform of the left ICA. No significant tortuosity. LEFT VERTEBRAL ARTERY:  Antegrade flow with low resistance waveform. IMPRESSION: Color duplex indicates moderate heterogeneous and calcified plaque, with no hemodynamically significant stenosis by duplex criteria in the extracranial cerebrovascular circulation. Signed, Yvone NeuJaime S. Reyne DumasWagner, DO, RPVI [facility] Electronically Signed   By: Gilmer MorJaime  Wagner D.O.  On: 10/31/2019 14:50     EKG: Independently reviewed.  Sinus rhythm, QTC 447, heart rate 50, LAD, nonspecific T wave change   Assessment/Plan Principal Problem:   Near syncope Active Problems:   Essential tremor   BPH with obstruction/lower urinary tract symptoms   COPD (chronic obstructive pulmonary disease) (HCC)   Atrial fibrillation (HCC)   Mixed hyperlipidemia   CKD (chronic kidney disease) stage 3, GFR 30-59 ml/min   HTN (hypertension)   Near Syncope: Etiology is not clear. The differential diagnosis is broad, including vasovagal syncope, TIA, bradycardia, orthostatic status, carotid artery stenosis. Pt has deep brain stimulator and cannot do MRI-brain. Pt had bradycardia with HR 40-50s per report. Card was consulted, message was sent to Dr. Juliann Pares.  - Place on tele bed for obs - tele monitoring - Hold metoprolol now - Orthostatic vital signs  - Carotid doppler - 2d echo - Neuro checks  - IVF: 1L of  NS, then NS 75 cc/h - hold lasix - PT/OT eval and treat  BPH: stable - Continue Hytrin and Proscar  COPD (chronic obstructive  pulmonary disease) (HCC): stable -prn albuterol   Atrial fibrillation (HCC): -continue Xareltor -hold metoprolol due to bradycardia  Mixed hyperlipidemia: -zocor  CKD (chronic kidney disease) stage 3a, GFR 30-59 ml/min: Cre 1.14 abnd BUN 14. Stable -f/u by BMP  HTN (hypertension): Bp 128/62 -hold lasix and metoprolol -prn hydralazine orally    DVT ppx: on Xarelto Code Status: Full code Family Communication: None at bed side.  Disposition Plan:  Anticipate discharge back to previous home environment Consults called:  none Admission status: Obs / tele    Date of Service 10/31/2019    Carlos Richard [facility]   If 7PM-7AM, please contact night-coverage [email or web address] Password [email or web address] 10/31/2019, 2:54 PM

## 2019-10-31 NOTE — ED Notes (Addendum)
Pt called wife and spoke to her.  Pt provided pillow for comfort.

## 2019-10-31 NOTE — ED Provider Notes (Signed)
[facility] Emergency Department Provider Note       Time seen: ----------------------------------------- 10:02 AM on 10/31/2019 -----------------------------------------   I have reviewed the triage vital signs and the nursing notes.  HISTORY   Chief Complaint Loss of Consciousness   HPI Carlos Richard is a 85 y.o. male with a history of atrial fibrillation, COPD, hypertension, near syncope, chronic kidney disease, hyperlipidemia who presents to the ED for multiple syncopal or near syncopal events.  Patient states he did not actually pass out but he felt like he was going to several times.  Wife had reported up to 3 of these events.  He felt weak today, was noted to be pale and bradycardic when EMS arrived.  Blood sugar was normal.  He has had some vomiting since EMS arrival.  Past Medical History:  Diagnosis Date  . Atrial fibrillation (HCC)   . COPD (chronic obstructive pulmonary disease) (HCC)   . Hypertension   . Near syncope   . Tremors of nervous system     Patient Active Problem List   Diagnosis Date Noted  . CKD (chronic kidney disease) stage 3, GFR 30-59 ml/min 08/07/2018  . Gout 08/03/2018  . Mixed hyperlipidemia 08/03/2018  . Benign hypertensive renal disease   . COPD (chronic obstructive pulmonary disease) (HCC)   . Atrial fibrillation (HCC)   . 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 Essential tremor 01/19/2011    Past Surgical History:  Procedure Laterality Date  . BACK SURGERY    . BRAIN SURGERY     stimulator placement for tremors    Allergies Patient has no known allergies.  Social History Social History   Tobacco Use  . Smoking status: Former Smoker    Types: Cigarettes    Quit date: 1980    Years since quitting: 40.9  . Smokeless tobacco: Never Used  . Tobacco comment: quit 40 years ago    Substance Use Topics  . Alcohol use: Yes    Comment: occasionally  . Drug use: No   Review of Systems Constitutional: Negative for fever. Cardiovascular: Negative for chest pain. Respiratory: Negative for shortness of breath. Gastrointestinal: Negative for abdominal pain, positive for vomiting Musculoskeletal: Negative for back pain. Skin: Negative for rash. Neurological: Negative for headaches, positive for weakness  All systems negative/normal/unremarkable except as stated in the HPI  ____________________________________________   PHYSICAL EXAM:  VITAL SIGNS: ED Triage Vitals [10/31/19 1001]  Enc Vitals Group     BP      Pulse      Resp      Temp      Temp src      SpO2      Weight 170 lb (77.1 kg)     Height 5\' 9"  (1.753 m)     Head Circumference      Peak Flow      Pain Score 0     Pain Loc      Pain Edu?      Excl. in GC?    Constitutional: Alert and oriented.  No acute distress Eyes: Conjunctivae are normal. Normal extraocular movements. ENT      Head: Normocephalic and atraumatic.      Nose: No congestion/rhinnorhea.      Mouth/Throat: Mucous membranes are moist.      Neck: No stridor. Cardiovascular: Normal rate, regular rhythm. No murmurs, rubs, or gallops. Respiratory: Normal  respiratory effort without tachypnea nor retractions. Breath sounds are clear and equal bilaterally. No wheezes/rales/rhonchi. Gastrointestinal: Soft and nontender. Normal bowel sounds Musculoskeletal: Nontender with normal range of motion in extremities. No lower extremity tenderness nor edema. Neurologic:  Normal speech and language. No gross focal neurologic deficits are appreciated.  Skin:  Skin is warm, dry and intact.  Psychiatric: Mood and affect are normal. Speech and behavior are normal.  ____________________________________________  EKG: Interpreted by me.  Sinus rhythm with rate of 50 bpm, left anterior fascicular block, normal  QT  ____________________________________________  ED COURSE:  As part of my medical decision making, I reviewed the following data within the [place] History obtained from family if available, nursing notes, old chart and ekg, as well as notes from prior ED visits. Patient presented for syncope and now vomiting, we will assess with labs and imaging as indicated at this time.   Procedures  Carlos Richard was evaluated in Emergency Department on 10/31/2019 for the symptoms described in the history of present illness. He was evaluated in the context of the global COVID-19 pandemic, which necessitated consideration that the patient might be at risk for infection with the SARS-CoV-2 virus that causes COVID-19. Institutional protocols and algorithms that pertain to the evaluation of patients at risk for COVID-19 are in a state of rapid change based on information released by regulatory bodies including the CDC and federal and state organizations. These policies and algorithms were followed during the patient's care in the ED.  ____________________________________________   LABS (pertinent positives/negatives)  Labs Reviewed  COMPREHENSIVE METABOLIC PANEL - Abnormal; Notable for the following components:      Result Value   Glucose, Bld 137 (*)    Calcium 8.7 (*)    Total Protein 6.3 (*)    GFR calc non Af Amer 58 (*)    All other components within normal limits  SARS CORONAVIRUS 2 (TAT 6-24 HRS)  CBC WITH DIFFERENTIAL/PLATELET  URINALYSIS, COMPLETE (UACMP) WITH MICROSCOPIC  CBG MONITORING, ED  TROPONIN I (HIGH SENSITIVITY)  TROPONIN I (HIGH SENSITIVITY)   ____________________________________________   DIFFERENTIAL DIAGNOSIS   Dehydration, electrolyte abnormality, renal failure, arrhythmia, MI  FINAL ASSESSMENT AND PLAN  Syncope, bradycardia   Plan: The patient had presented for multiple syncopal or near syncopal events. Patient's labs have not revealed any  acute process.  Patient overall had a period of prolonged weakness with multiple near syncopal or syncopal events.  He has been bradycardic here heart rate remained in the 40s for a long period of time.  I feel be best to admit him for observation on telemetry.  Patient and family agree with plan.   Laurence Aly, MD    Note: This note was generated in part or whole with voice recognition software. Voice recognition is usually quite accurate but there are transcription errors that can and very often do occur. I apologize for any typographical errors that were not detected and corrected.     Earleen Newport, MD 10/31/19 [phone]

## 2019-10-31 NOTE — ED Triage Notes (Signed)
Arrived by EMS from home for syncopal episodes X3. Feeling weak today. Pale and bradycardiac when EMS arrived. Blood sugar 176.  EMS gave 4mg  zofran. A&O x4 upon arrival.

## 2019-10-31 NOTE — ED Notes (Signed)
Called wife Romie Minus and informed of admission.

## 2019-10-31 NOTE — ED Notes (Signed)
Wife had brought pt eye drops earlier today. Pt takes once in AM and once at night. This RN administered both drops to R eye (had cataract surgery recently). Pt tolerated well.

## 2019-10-31 NOTE — ED Notes (Signed)
Pt assisted to toilet to urinate. Ambulated with 1 assist. Urine collected and sent to lab.   Admitting MD at bedside.

## 2019-10-31 NOTE — ED Notes (Signed)
Pt ambulatory to toilet with steady gait.  

## 2019-10-31 NOTE — TOC Initial Note (Signed)
Transition of Care [facility]) - Initial/Assessment Note    Patient Details  Name: Carlos Richard MRN: [id number] Date of Birth: 1932-10-18  [facility] of [facility]) CM/SW Contact:    Anselm Pancoast, RN Phone Number: 10/31/2019, 10:14 AM  Clinical Narrative:                 85 year old male admitted with syncopal episode. RN CM spoke with wife on the phone and confirmed patient is independent with all ADL's and has no anticipated needs. Patient had cataract surgery  Week ago and has follow up appointment at 2pm today however wife will call and cancel. RN CM discussed visitor restrictions and spouse requested CM to notify patient that she is not allowed to come to ED. RN CM agreed to do so. Confirmed CM RN contact info for follow up.   Expected Discharge Plan: Home/Self Care     Patient Goals and CMS Choice Patient states their goals for this hospitalization and ongoing recovery are:: Get back home      Expected Discharge Plan and Services Expected Discharge Plan: Home/Self Care       Living arrangements for the past 2 months: Single Family Home                                      Prior Living Arrangements/Services Living arrangements for the past 2 months: Single Family Home Lives with:: Spouse Patient language and need for interpreter reviewed:: Yes Do you feel safe going back to the place where you live?: Yes      Need for Family Participation in Patient Care: No (Comment) Care giver support system in place?: Yes (comment)   Criminal Activity/Legal Involvement Pertinent to Current Situation/Hospitalization: No - Comment as needed  Activities of Daily Living      Permission Sought/Granted                  Emotional Assessment Appearance:: Appears stated age Attitude/Demeanor/Rapport: Engaged Affect (typically observed): Accepting Orientation: : Oriented to Self, Oriented to Place, Oriented to  Time, Oriented to Situation Alcohol / Substance Use: Never  Used Psych Involvement: No (comment)  Admission diagnosis:  syncope Patient Active Problem List   Diagnosis Date Noted  . CKD (chronic kidney disease) stage 3, GFR 30-59 ml/min 08/07/2018  . Gout 08/03/2018  . Mixed hyperlipidemia 08/03/2018  . Benign hypertensive renal disease   . COPD (chronic obstructive pulmonary disease) ([place])   . Atrial fibrillation ([place])   . 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 Essential tremor 01/19/2011   PCP:  Valerie Roys, DO Pharmacy:   [place], [place] - [address] [place] [address] Phone: [phone] Fax: [phone]  [facility] [place] [place] [address] [place] [address] Phone: [phone] Fax: [phone]     Social Determinants of Health (SDOH) Interventions    Readmission Risk Interventions No flowsheet data found.

## 2019-11-01 ENCOUNTER — Observation Stay
Admit: 2019-11-01 | Discharge: 2019-11-01 | Disposition: A | Discharge status: Home / Self Care | Payer: Medicare Other | Attending: Internal Medicine | Admitting: Internal Medicine

## 2019-11-01 DIAGNOSIS — R55 Syncope and collapse: Secondary | ICD-10-CM | POA: Diagnosis not present

## 2019-11-01 LAB — CBC
HCT: 35 % — ABNORMAL LOW (ref 39.0–52.0)
Hemoglobin: 12.1 g/dL — ABNORMAL LOW (ref 13.0–17.0)
MCH: 30.5 pg (ref 26.0–34.0)
MCHC: 34.6 g/dL (ref 30.0–36.0)
MCV: 88.2 fL (ref 80.0–100.0)
Platelets: 153 10*3/uL (ref 150–400)
RBC: 3.97 MIL/uL — ABNORMAL LOW (ref 4.22–5.81)
RDW: 12.8 % (ref 11.5–15.5)
WBC: 8.4 10*3/uL (ref 4.0–10.5)
nRBC: 0 % (ref 0.0–0.2)

## 2019-11-01 LAB — BASIC METABOLIC PANEL
Anion gap: 7 (ref 5–15)
BUN: 12 mg/dL (ref 8–23)
CO2: 24 mmol/L (ref 22–32)
Calcium: 8.5 mg/dL — ABNORMAL LOW (ref 8.9–10.3)
Chloride: 111 mmol/L (ref 98–111)
Creatinine, Ser: 1.22 mg/dL (ref 0.61–1.24)
GFR calc Af Amer: >60 mL/min (ref >60)
GFR calc non Af Amer: 54 mL/min — ABNORMAL LOW (ref >60)
Glucose, Bld: 95 mg/dL (ref 70–99)
Potassium: 3.9 mmol/L (ref 3.5–5.1)
Sodium: 142 mmol/L (ref 135–145)

## 2019-11-01 LAB — ECHOCARDIOGRAM COMPLETE
Height: 69 in
Weight: 2720 oz

## 2019-11-01 MED ORDER — SODIUM CHLORIDE 0.9 % IV BOLUS
250.0000 mL | Freq: Once | INTRAVENOUS | Status: AC
  Administered 2019-11-01: 250 mL via INTRAVENOUS

## 2019-11-01 NOTE — Care Management (Addendum)
ED RN CM: incoming call from wife, Romie Minus, requesting update on patient.   RN CM: Contacted wife, jean to update that patient would be discharged today and would need follow up at Dr. Humphrey Rolls office tomorrow morning at 9AM.Wife states appointment will not be a problem and she can drive patient. No other needs or concerns and she will wait for call to pick patient up at discharge.

## 2019-11-01 NOTE — ED Notes (Signed)
Pt given breakfast tray with decaf coffee

## 2019-11-01 NOTE — ED Notes (Signed)
OT at bedside- pt has no needs at this time

## 2019-11-01 NOTE — ED Notes (Addendum)
Per Dr. Humphrey Rolls pt to be discharged home and to follow up in his office at 9 AM- Per Dr. Humphrey Rolls, Dr Blaine Hamper to place discharge order- pt discharged home with follow up instructions- Dr Eppie Gibson sent this RN a message and stated she was unaware of pt being discharged- explained to Dr Jimmye Norman that Dr Humphrey Rolls cleared pt to leave- Dr Jimmye Norman states she will place d/c summary

## 2019-11-01 NOTE — ED Notes (Signed)
Pt assisted to the bathroom with no issue. Pt placed back in bed.

## 2019-11-01 NOTE — Care Management (Signed)
RN CM: confirmed with nurse patient discharging home today. Call to wife, Romie Minus with update to pick patient up at emergency department. No other needs or concerns at this time.

## 2019-11-01 NOTE — Evaluation (Signed)
Occupational Therapy Evaluation Patient Details Name: Carlos Richard MRN: [id number] DOB: [birth year] Today's Date: 11/01/2019    History of Present Illness Carlos Richard is a 85 y.o. 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   Clinical Impression   [name] was seen for OT evaluation this date. Prior to hospital admission, pt was active and independent in ADL tasks. Pt lives with his wife in a 1-level home with 5 steps to enter and bilateral handrails.  Per pt, he and his wife spend most of their time caring for their home, doing yard work, Catering manager. He denies a falls history in the past year, but does report that he is "losing my balance" and "can't walk straight". Pt does not use an adapted device for functional mobility at baseline. Currently pt demonstrates impairments in balance & safety awareness with decreased knowledge of safe use of AE which functionally limit his ability to safely and independently perform self-care tasks. Pt requires supervision to min assist for LB ADL tasks and functional mobility.  Pt would benefit from skilled OT to address noted impairments and functional limitations (see below for any additional details) in order to maximize safety and independence while minimizing falls risk and caregiver burden.  Upon hospital discharge, recommend HHOT to maximize pt safety and functional independence during meaningful occupations of daily life.      Follow Up Recommendations  Home health OT    Equipment Recommendations  Tub/shower seat;Other (comment)(Sock Aid/Long Forensic psychologist)    Recommendations for Other Services PT consult     Precautions / Restrictions Precautions Precautions: Fall Precaution Comments: low fall Restrictions Weight Bearing Restrictions: No      Mobility Bed  Mobility Overal bed mobility: Independent             General bed mobility comments: Pt comes to sitting on stretcher bed w/o assist this date. HOB lowered.  Transfers Overall transfer level: Needs assistance Equipment used: Rolling walker (2 wheeled) Transfers: Sit to/from Stand           General transfer comment: Pt requires cueing for safe use of RW this date.    Balance Overall balance assessment: Needs assistance Sitting-balance support: Feet supported Sitting balance-Leahy Scale: Good     Standing balance support: During functional activity;Bilateral upper extremity supported;Single extremity supported Standing balance-Leahy Scale: Fair Standing balance comment: Pt reports ongoing difficulties with balance. Believes they may be related to his syncopal "episodes"                           ADL either performed or assessed with clinical judgement   ADL Overall ADL's : Needs assistance/impaired                                       General ADL Comments: Pt supervision to min assist for LB ADL tasks. Pt noted with limited safety awareness this date and mild impulsivity with movement this date. Would benefit from further education in AE and falls prevention strategies to support safety and functional independence during ADL mgt upon hospital DC.     Vision Baseline Vision/History: Cataracts;Wears glasses(Pt had bilat cataract sx 1 week PTA) Wears Glasses: Reading only Patient Visual Report: No change from baseline Vision Assessment?: No  apparent visual deficits     Perception     Praxis      Pertinent Vitals/Pain Pain Assessment: No/denies pain     Hand Dominance Right   Extremity/Trunk Assessment Upper Extremity Assessment Upper Extremity Assessment: Overall WFL for tasks assessed(Pt BUE WFL grossly 4/5 t/o. ROM and FMC WNL. Pt with hx of essential tremor with deep brain stimulator placement.)   Lower Extremity Assessment Lower  Extremity Assessment: Overall WFL for tasks assessed   Cervical / Trunk Assessment Cervical / Trunk Assessment: Normal   Communication Communication Communication: No difficulties   Cognition Arousal/Alertness: Awake/alert Behavior During Therapy: WFL for tasks assessed/performed Overall Cognitive Status: Within Functional Limits for tasks assessed                                     General Comments       Exercises Other Exercises Other Exercises: Pt educated in falls prevention strategies and safe use of AE/DME for ADL mgt. Would benefit from reinforcment.   Shoulder Instructions      Home Living Family/patient expects to be discharged to:: Private residence Living Arrangements: Spouse/significant other Available Help at Discharge: Family;Available 24 hours/day(Per pt wife is home 24/7) Type of Home: House Home Access: Stairs to enter [employer] of Steps: 5 Entrance Stairs-Rails: Right;Left;Can reach both Home Layout: One level     Bathroom Shower/Tub: Tub/shower unit;Curtain         Home Equipment: None          Prior Functioning/Environment Level of Independence: Independent        Comments: Pt reports being independent at baseline for ADL/IADL mgt. He reports wife does housework and the pair work together in the yard.        OT Problem List: Decreased safety awareness;Decreased knowledge of use of DME or AE;Impaired balance (sitting and/or standing)      OT Treatment/Interventions: Self-care/ADL training;Therapeutic exercise;Therapeutic activities;DME and/or AE instruction;Patient/family education;Balance training    OT Goals(Current goals can be found in the care plan section) Acute Rehab OT Goals Patient Stated Goal: To go home safely OT Goal Formulation: With patient Time For Goal Achievement: 11/15/19 Potential to Achieve Goals: Good ADL Goals Pt Will Perform Lower Body Dressing: with set-up;with supervision;with  adaptive equipment(With LRAD PRN for improved safety and functional independence.) Additional ADL Goal #1: Pt will independently verbalize a plan to implement at least 3 learned falls prevention strategies into his daily routines and/or home environment for improved safety and functional independence upon hospital DC.  OT Frequency: Min 1X/week   Barriers to D/C:            Co-evaluation              AM-PAC OT "6 Clicks" Daily Activity     Outcome Measure Help from another person eating meals?: None Help from another person taking care of personal grooming?: None Help from another person toileting, which includes using toliet, bedpan, or urinal?: A Little Help from another person bathing (including washing, rinsing, drying)?: A Little Help from another person to put on and taking off regular upper body clothing?: None Help from another person to put on and taking off regular lower body clothing?: A Little 6 Click Score: 21   End of Session Equipment Utilized During Treatment: Rolling walker;Gait belt  Activity Tolerance: Patient tolerated treatment well Patient left: in bed;with call bell/phone within reach(Pt in gurney bed.)  OT Visit Diagnosis: Other abnormalities of gait and mobility (R26.89)                Time: [id number] OT Time Calculation (min): 33 min Charges:  OT General Charges $OT Visit: 1 Visit OT Evaluation $OT Eval Moderate Complexity: 1 Mod OT Treatments $Self Care/Home Management : 8-22 mins  Rockney GheeSerenity Lennyx Verdell, M.S., OTR/L Ascom: [phone] 11/01/19, 11:00 AM

## 2019-11-01 NOTE — Consult Note (Signed)
Carlos Richard is a 85 y.o. male  [id number]  Primary Cardiologist: Adrian Blackwater Reason for Consultation: Syncope  HPI: This 85 year old white male with a past medical history of hypertension near syncope atrial fibrillation presented to the hospital with episode where he passed out.  Right now he feels fine.   Review of Systems: No chest pain no shortness of breath   Past Medical History:  Diagnosis Date  . Atrial fibrillation (HCC)   . COPD (chronic obstructive pulmonary disease) (HCC)   . Hypertension   . Near syncope   . Tremors of nervous system     (Not in a hospital admission)    . allopurinol  100 mg Oral Once per day on Mon Wed Fri  . finasteride  5 mg Oral Daily  . losartan  50 mg Oral Daily  . magnesium oxide  400 mg Oral Daily  . Rivaroxaban  15 mg Oral Q supper  . simvastatin  20 mg Oral Daily  . sodium chloride flush  3 mL Intravenous Q12H  . terazosin  5 mg Oral QHS  . vitamin E  200 Units Oral Daily  . zinc sulfate  220 mg Oral Daily    Infusions: . sodium chloride 75 mL/hr at 11/01/19 0407    No Known Allergies  Social History   Socioeconomic History  . Marital status: Married    Spouse name: Not on file  . Number of children: Not on file  . Years of education: Not on file  . Highest education level: Associate degree: academic program  Occupational History  . Not on file  Tobacco Use  . Smoking status: Former Smoker    Types: Cigarettes    Quit date: 1980    Years since quitting: 40.9  . Smokeless tobacco: Never Used  . Tobacco comment: quit 40 years ago   Substance and Sexual Activity  . Alcohol use: Yes    Comment: occasionally  . Drug use: No  . Sexual activity: Yes  Other Topics Concern  . Not on file  Social History Narrative  . Not on file   Social Determinants of Health   Financial Resource Strain: Low Risk   . Difficulty of Paying Living Expenses: Not hard at all  Food Insecurity: No Food Insecurity  . Worried  About Programme researcher, broadcasting/film/video in the Last Year: Never true  . Ran Out of Food in the Last Year: Never true  Transportation Needs: No Transportation Needs  . Lack of Transportation (Medical): No  . Lack of Transportation (Non-Medical): No  Physical Activity: Inactive  . Days of Exercise per Week: 0 days  . Minutes of Exercise per Session: 0 min  Stress: No Stress Concern Present  . Feeling of Stress : Not at all  Social Connections: Somewhat Isolated  . Frequency of Communication with Friends and Family: More than three times a week  . Frequency of Social Gatherings with Friends and Family: More than three times a week  . Attends Religious Services: Never  . Active Member of Clubs or Organizations: No  . Attends Banker Meetings: Never  . Marital Status: Married  Catering manager Violence: Not At Risk  . Fear of Current or Ex-Partner: No  . Emotionally Abused: No  . Physically Abused: No  . Sexually Abused: No    Family History  Problem Relation Age of Onset  . Tuberculosis Mother   . Tremor Paternal Grandmother   . Obesity Sister   .  Prostate cancer Neg Hx   . Bladder Cancer Neg Hx   . Kidney cancer Neg Hx     PHYSICAL EXAM: Vitals:   11/01/19 0700 11/01/19 0800  BP:    Pulse: 65 67  Resp: (!) 21 18  Temp:    SpO2: 91% 92%     Intake/Output Summary (Last 24 hours) at 11/01/2019 1005 Last data filed at 10/31/2019 1825 Gross per 24 hour  Intake 1281.14 ml  Output --  Net 1281.14 ml    General:  Well appearing. No respiratory difficulty HEENT: normal Neck: supple. no JVD. Carotids 2+ bilat; no bruits. No lymphadenopathy or thryomegaly appreciated. Cor: PMI nondisplaced. Regular rate & rhythm. No rubs, gallops or murmurs. Lungs: clear Abdomen: soft, nontender, nondistended. No hepatosplenomegaly. No bruits or masses. Good bowel sounds. Extremities: no cyanosis, clubbing, rash, edema Neuro: alert & oriented x 3, cranial nerves grossly intact. moves all  4 extremities w/o difficulty. Affect pleasant.  ECG: Sinus bradycardia 50 bpm  Results for orders placed or performed during the hospital encounter of 10/31/19 (from the past 24 hour(s))  BNP     Status: None   Collection Time: 10/31/19 10:06 AM  Result Value Ref Range   B Natriuretic Peptide 78.0 0.0 - 100.0 pg/mL  CBC with Differential     Status: None   Collection Time: 10/31/19 10:07 AM  Result Value Ref Range   WBC 8.2 4.0 - 10.5 K/uL   RBC 4.35 4.22 - 5.81 MIL/uL   Hemoglobin 13.3 13.0 - 17.0 g/dL   HCT 45.439.9 09.839.0 - 11.952.0 %   MCV 91.7 80.0 - 100.0 fL   MCH 30.6 26.0 - 34.0 pg   MCHC 33.3 30.0 - 36.0 g/dL   RDW 14.712.9 82.911.5 - 56.215.5 %   Platelets 167 150 - 400 K/uL   nRBC 0.0 0.0 - 0.2 %   Neutrophils Relative % 49 %   Neutro Abs 4.0 1.7 - 7.7 K/uL   Lymphocytes Relative 40 %   Lymphs Abs 3.3 0.7 - 4.0 K/uL   Monocytes Relative 7 %   Monocytes Absolute 0.6 0.1 - 1.0 K/uL   Eosinophils Relative 3 %   Eosinophils Absolute 0.3 0.0 - 0.5 K/uL   Basophils Relative 1 %   Basophils Absolute 0.1 0.0 - 0.1 K/uL   Immature Granulocytes 0 %   Abs Immature Granulocytes 0.01 0.00 - 0.07 K/uL  Comprehensive metabolic panel     Status: Abnormal   Collection Time: 10/31/19 10:07 AM  Result Value Ref Range   Sodium 141 135 - 145 mmol/L   Potassium 4.3 3.5 - 5.1 mmol/L   Chloride 108 98 - 111 mmol/L   CO2 26 22 - 32 mmol/L   Glucose, Bld 137 (H) 70 - 99 mg/dL   BUN 14 8 - 23 mg/dL   Creatinine, Ser 1.301.14 0.61 - 1.24 mg/dL   Calcium 8.7 (L) 8.9 - 10.3 mg/dL   Total Protein 6.3 (L) 6.5 - 8.1 g/dL   Albumin 3.7 3.5 - 5.0 g/dL   AST 25 15 - 41 U/L   ALT 20 0 - 44 U/L   Alkaline Phosphatase 44 38 - 126 U/L   Total Bilirubin 0.8 0.3 - 1.2 mg/dL   GFR calc non Af Amer 58 (L) >60 mL/min   GFR calc Af Amer >60 >60 mL/min   Anion gap 7 5 - 15  Troponin I (High Sensitivity)     Status: None   Collection Time: 10/31/19 10:07 AM  Result Value Ref Range   Troponin I (High Sensitivity) 6 <18  ng/L  Urinalysis, Complete w Microscopic     Status: Abnormal   Collection Time: 10/31/19 10:07 AM  Result Value Ref Range   Color, Urine YELLOW (A) YELLOW   APPearance HAZY (A) CLEAR   Specific Gravity, Urine 1.011 1.005 - 1.030   pH 7.0 5.0 - 8.0   Glucose, UA NEGATIVE NEGATIVE mg/dL   Hgb urine dipstick NEGATIVE NEGATIVE   Bilirubin Urine NEGATIVE NEGATIVE   Ketones, ur NEGATIVE NEGATIVE mg/dL   Protein, ur NEGATIVE NEGATIVE mg/dL   Nitrite NEGATIVE NEGATIVE   Leukocytes,Ua NEGATIVE NEGATIVE   RBC / HPF 6-10 0 - 5 RBC/hpf   WBC, UA 0-5 0 - 5 WBC/hpf   Bacteria, UA NONE SEEN NONE SEEN   Squamous Epithelial / LPF NONE SEEN 0 - 5   Mucus PRESENT   SARS CORONAVIRUS 2 (TAT 6-24 HRS) Nasopharyngeal Nasopharyngeal Swab     Status: None   Collection Time: 10/31/19 10:07 AM   Specimen: Nasopharyngeal Swab  Result Value Ref Range   SARS Coronavirus 2 NEGATIVE NEGATIVE  Troponin I (High Sensitivity)     Status: None   Collection Time: 10/31/19 12:15 PM  Result Value Ref Range   Troponin I (High Sensitivity) 6 <18 ng/L  Lipase, blood     Status: None   Collection Time: 10/31/19 12:15 PM  Result Value Ref Range   Lipase 39 11 - 51 U/L  Basic metabolic panel     Status: Abnormal   Collection Time: 11/01/19  4:06 AM  Result Value Ref Range   Sodium 142 135 - 145 mmol/L   Potassium 3.9 3.5 - 5.1 mmol/L   Chloride 111 98 - 111 mmol/L   CO2 24 22 - 32 mmol/L   Glucose, Bld 95 70 - 99 mg/dL   BUN 12 8 - 23 mg/dL   Creatinine, Ser 1.22 0.61 - 1.24 mg/dL   Calcium 8.5 (L) 8.9 - 10.3 mg/dL   GFR calc non Af Amer 54 (L) >60 mL/min   GFR calc Af Amer >60 >60 mL/min   Anion gap 7 5 - 15  CBC     Status: Abnormal   Collection Time: 11/01/19  4:06 AM  Result Value Ref Range   WBC 8.4 4.0 - 10.5 K/uL   RBC 3.97 (L) 4.22 - 5.81 MIL/uL   Hemoglobin 12.1 (L) 13.0 - 17.0 g/dL   HCT 35.0 (L) 39.0 - 52.0 %   MCV 88.2 80.0 - 100.0 fL   MCH 30.5 26.0 - 34.0 pg   MCHC 34.6 30.0 - 36.0 g/dL    RDW 12.8 11.5 - 15.5 %   Platelets 153 150 - 400 K/uL   nRBC 0.0 0.0 - 0.2 %   CT HEAD WO CONTRAST  Result Date: 10/31/2019 CLINICAL DATA:  Recurrent syncope. EXAM: CT HEAD WITHOUT CONTRAST TECHNIQUE: Contiguous axial images were obtained from the base of the skull through the vertex without intravenous contrast. COMPARISON:  July 03, 2015 FINDINGS: Brain: A deep brain stimulator is again present from a left frontal approach, stable in position. No subdural, epidural, or subarachnoid hemorrhage. Cerebellum, brainstem, and basal cisterns are normal. Ventricles and sulci are stable. White matter changes are similar in the interval. No acute cortical ischemia or infarct. Vascular: No hyperdense vessel or unexpected calcification. Skull: Normal. Negative for fracture or focal lesion. Sinuses/Orbits: No acute finding. Other: None. IMPRESSION: Chronic changes in the brain. Stable deep brain stimulator.  No acute intracranial abnormalities identified to explain the patient's syncope. Electronically Signed   By: Gerome Sam III M.D   On: 10/31/2019 13:51   US Carotid Bilateral  Result Date: 10/31/2019 CLINICAL DATA:  85 year old male with syncope EXAM: BILATERAL CAROTID DUPLEX ULTRASOUND TECHNIQUE: Wallace Cullens scale imaging, color Doppler and duplex ultrasound were performed of bilateral carotid and vertebral arteries in the neck. COMPARISON:  None. FINDINGS: Criteria: Quantification of carotid stenosis is based on velocity parameters that correlate the residual internal carotid diameter with NASCET-based stenosis levels, using the diameter of the distal internal carotid lumen as the denominator for stenosis measurement. The following velocity measurements were obtained: RIGHT ICA:  Systolic 100 cm/sec, Diastolic 26 cm/sec CCA:  91 cm/sec SYSTOLIC ICA/CCA RATIO:  1.1 ECA:  136 cm/sec LEFT ICA:  Systolic 83 cm/sec, Diastolic 26 cm/sec CCA:  93 cm/sec SYSTOLIC ICA/CCA RATIO:  0.9 ECA:  109 cm/sec Right Brachial SBP:  Not acquired Left Brachial SBP: Not acquired RIGHT CAROTID ARTERY: No significant calcifications of the right common carotid artery. Intermediate waveform maintained. Moderate heterogeneous and partially calcified plaque at the right carotid bifurcation. No significant lumen shadowing. Low resistance waveform of the right ICA. No significant tortuosity. RIGHT VERTEBRAL ARTERY: Antegrade flow with low resistance waveform. LEFT CAROTID ARTERY: No significant calcifications of the left common carotid artery. Intermediate waveform maintained. Moderate heterogeneous and partially calcified plaque at the left carotid bifurcation. No significant lumen shadowing. Low resistance waveform of the left ICA. No significant tortuosity. LEFT VERTEBRAL ARTERY:  Antegrade flow with low resistance waveform. IMPRESSION: Color duplex indicates moderate heterogeneous and calcified plaque, with no hemodynamically significant stenosis by duplex criteria in the extracranial cerebrovascular circulation. Signed, Yvone Neu. Reyne Dumas, RPVI [facility] Electronically Signed   By: Gilmer Mor D.O.   On: 10/31/2019 14:50     ASSESSMENT AND PLAN: Syncope most likely secondary to dehydration versus bradycardia.  Currently patient appears to be fine and will do outpatient work-up with event monitor and then evaluate for pacemaker.  Patient can be discharged with follow-up Monday at 9 AM in my office.  Britnie Colville A

## 2019-11-01 NOTE — Discharge Summary (Addendum)
Physician Discharge Summary  Carlos Richard UXN:[id number] DOB: 12-08-1931 DOA: 10/31/2019  PCP: Dorcas Carrow, DO  Admit date: 10/31/2019 Discharge date: 11/01/2019  Admitted From: home Disposition:  Home   Recommendations for Outpatient Follow-up:  1. Follow up with PCP in 1-2 weeks  2. F/u cardio, Dr. Welton Flakes, on 11/05/19 at 9AM Home Health: n/a Equipment/Devices: n/a  Discharge Condition:stable CODE STATUS: full  Diet recommendation: Heart Healthy   Brief/Interim Summary: HPI taken from Dr. Clyde Lundborg: Carlos Richard is a 85 y.o. 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  Per report, pt has had three episodes of near syncope. He has generalized weakness and dizziness.  No unilateral numbness or tingling in his extremities. No facial droop or slurred speech. Patient states he did not actually pass out but he felt like he was going to several times. Per report, pt was pale and bradycardiac when EMS arrived with HR 40-50s.  Patient denies any chest pain, cough, shortness of breath, fever, chills.  He has nausea and vomited at least twice with nonbilious nonbloody vomitus.  Currently no nausea, vomiting, diarrhea or abdominal pain.  No symptoms of UTI.  ED Course: pt was found to have WBC 8.2, troponin 6, pending COVID-19 test (EDP ordered new respiratory virus panel test), stable renal function, temperature normal, blood pressure 128/62, heart rate 50-80s in ED, RR 22, O2 93% on RA currently  Hospital Course from Dr. Wilfred Lacy 11/01/19:  Pt was admitted for syncope. Echo was done which showed EF 60-65%, aortic valve sclerosis. Carotid US did not show any significant stenosis. Cardio was consulted and recommended outpatient workup w/ event monitor & then evaluate for pacemaker. Pt will  f/u in Dr. Milta Deiters office  on 11/05/19 at 9AM. Pt was d/c by the ER nurse, Earlene Plater, A., without d/c orders or instructions as she was told by Dr. Welton Flakes that the admitting physician, Dr. Clyde Lundborg, had already d/c the pt. This was not true. Pt will have close f/u the cardiologist outpatient and hopefully knows to come back to the ER if any of the previous symptoms occur again.   Discharge Diagnoses:  Principal Problem:   Near syncope Active Problems:   Essential tremor   BPH with obstruction/lower urinary tract symptoms   COPD (chronic obstructive pulmonary disease) (HCC)   Atrial fibrillation (HCC)   Mixed hyperlipidemia   CKD (chronic kidney disease) stage 3, GFR 30-59 ml/min   HTN (hypertension)  Near Syncope: Etiology unclear, ddx includes  vasovagal syncope, TIA, bradycardia, orthostatic status, carotid artery stenosis. Pt has deep brain stimulator and cannot do MRI-brain. Cardio recs outpatient workup w/ event monitor & then evaluate for pacemaker. Will f/u w/ cardio on 11/05/19. Continue on tele. Continue to hold metoprolol, lasix. Carotid doppler shows no significant stenosis. Echo showed EF 60-65%, aortic valve sclerosis. Continue w/ neuro checks. PT/OT consulted. OT recommended home health OT. PT did not get a chance to see the pt  BPH: stable. Continue Hytrin and Proscar  COPD: w/o exacerbation. Stable. Bronchodilators prn  Atrial fibrillation: likely PAF. Continue on xarelto. Continue to hold metoprolol secondary to bradycardia  Mixed hyperlipidemia: continue on statin   CKDIIa: Cr is trending down from day prior. Will continue to monitor   HTN: continue to hold lasix, metoprolol. Hydralazine prn    Discharge Instructions   No med rec was done as there were  no d/c orders placed. See above for more information  No Known Allergies  Consultations:  Cardio    Procedures/Studies: CT HEAD WO CONTRAST  Result Date: 10/31/2019 CLINICAL DATA:  Recurrent syncope. EXAM: CT HEAD WITHOUT CONTRAST  TECHNIQUE: Contiguous axial images were obtained from the base of the skull through the vertex without intravenous contrast. COMPARISON:  July 03, 2015 FINDINGS: Brain: A deep brain stimulator is again present from a left frontal approach, stable in position. No subdural, epidural, or subarachnoid hemorrhage. Cerebellum, brainstem, and basal cisterns are normal. Ventricles and sulci are stable. White matter changes are similar in the interval. No acute cortical ischemia or infarct. Vascular: No hyperdense vessel or unexpected calcification. Skull: Normal. Negative for fracture or focal lesion. Sinuses/Orbits: No acute finding. Other: None. IMPRESSION: Chronic changes in the brain. Stable deep brain stimulator. No acute intracranial abnormalities identified to explain the patient's syncope. Electronically Signed   By: Gerome Sam III M.D   On: 10/31/2019 13:51   US Carotid Bilateral  Result Date: 10/31/2019 CLINICAL DATA:  85 year old male with syncope EXAM: BILATERAL CAROTID DUPLEX ULTRASOUND TECHNIQUE: Wallace Cullens scale imaging, color Doppler and duplex ultrasound were performed of bilateral carotid and vertebral arteries in the neck. COMPARISON:  None. FINDINGS: Criteria: Quantification of carotid stenosis is based on velocity parameters that correlate the residual internal carotid diameter with NASCET-based stenosis levels, using the diameter of the distal internal carotid lumen as the denominator for stenosis measurement. The following velocity measurements were obtained: RIGHT ICA:  Systolic 100 cm/sec, Diastolic 26 cm/sec CCA:  91 cm/sec SYSTOLIC ICA/CCA RATIO:  1.1 ECA:  136 cm/sec LEFT ICA:  Systolic 83 cm/sec, Diastolic 26 cm/sec CCA:  93 cm/sec SYSTOLIC ICA/CCA RATIO:  0.9 ECA:  109 cm/sec Right Brachial SBP: Not acquired Left Brachial SBP: Not acquired RIGHT CAROTID ARTERY: No significant calcifications of the right common carotid artery. Intermediate waveform maintained. Moderate heterogeneous and  partially calcified plaque at the right carotid bifurcation. No significant lumen shadowing. Low resistance waveform of the right ICA. No significant tortuosity. RIGHT VERTEBRAL ARTERY: Antegrade flow with low resistance waveform. LEFT CAROTID ARTERY: No significant calcifications of the left common carotid artery. Intermediate waveform maintained. Moderate heterogeneous and partially calcified plaque at the left carotid bifurcation. No significant lumen shadowing. Low resistance waveform of the left ICA. No significant tortuosity. LEFT VERTEBRAL ARTERY:  Antegrade flow with low resistance waveform. IMPRESSION: Color duplex indicates moderate heterogeneous and calcified plaque, with no hemodynamically significant stenosis by duplex criteria in the extracranial cerebrovascular circulation. Signed, Yvone Neu. Reyne Dumas, RPVI [facility] Electronically Signed   By: Gilmer Mor D.O.   On: 10/31/2019 14:50   ECHOCARDIOGRAM COMPLETE  Result Date: 11/01/2019   ECHOCARDIOGRAM REPORT   Patient Name:   Carlos Richard Date of Exam: 11/01/2019 Medical Rec #:  [id number]        Height:       69.0 in Accession #:    [id number]       Weight:       170.0 lb Date of Birth:  01-Nov-1932        BSA:          1.93 m Patient Age:    87 years         BP:           146/70 mmHg Patient Gender: M                HR:  68 bpm. Exam Location:  [facility] Procedure: 2D Echo, Color Doppler and Cardiac Doppler Indications:     R55 Syncope  History:         Patient has no prior history of Echocardiogram examinations.                  COPD, Arrythmias:Atrial Fibrillation; Risk                  Factors:Hypertension.  Sonographer:     Humphrey RollsJoan Heiss RDCS (AE) Referring Phys:  Kern [id number] Brien FewXILIN NIU Diagnosing Phys: Adrian BlackwaterShaukat Khan MD  Sonographer Comments: Suboptimal parasternal window. Image acquisition challenging due to COPD. IMPRESSIONS  1. Left ventricular ejection fraction, by visual  estimation, is 60 to 65%. The left ventricle has normal function. There is mildly increased left ventricular hypertrophy.  2. Global right ventricle has normal systolic function.The right ventricular size is normal. No increase in right ventricular wall thickness.  3. Left atrial size was normal.  4. Right atrial size was normal.  5. The mitral valve is normal in structure. Trivial mitral valve regurgitation. No evidence of mitral stenosis.  6. The tricuspid valve is normal in structure. Tricuspid valve regurgitation is trivial.  7. The aortic valve is normal in structure. Aortic valve regurgitation is not visualized. Mild to moderate aortic valve sclerosis/calcification without any evidence of aortic stenosis.  8. The pulmonic valve was normal in structure. Pulmonic valve regurgitation is not visualized.  9. Moderately elevated pulmonary artery systolic pressure. 10. The inferior vena cava is normal in size with greater than 50% respiratory variability, suggesting right atrial pressure of 3 mmHg. FINDINGS  Left Ventricle: Left ventricular ejection fraction, by visual estimation, is 60 to 65%. The left ventricle has normal function. The left ventricle is not well visualized. There is mildly increased left ventricular hypertrophy. Normal left atrial pressure. Right Ventricle: The right ventricular size is normal. No increase in right ventricular wall thickness. Global RV systolic function is has normal systolic function. The tricuspid regurgitant velocity is 3.07 m/s, and with an assumed right atrial pressure  of 10 mmHg, the estimated right ventricular systolic pressure is moderately elevated at 47.7 mmHg. Left Atrium: Left atrial size was normal in size. Right Atrium: Right atrial size was normal in size Pericardium: There is no evidence of pericardial effusion. Mitral Valve: The mitral valve is normal in structure. Trivial mitral valve regurgitation. No evidence of mitral valve stenosis by observation. MV peak  gradient, 3.6 mmHg. Tricuspid Valve: The tricuspid valve is normal in structure. Tricuspid valve regurgitation is trivial. Aortic Valve: The aortic valve is normal in structure. Aortic valve regurgitation is not visualized. Mild to moderate aortic valve sclerosis/calcification is present, without any evidence of aortic stenosis. Aortic valve mean gradient measures 6.0 mmHg. Aortic valve peak gradient measures 12.4 mmHg. Aortic valve area, by VTI measures 2.18 cm. Pulmonic Valve: The pulmonic valve was normal in structure. Pulmonic valve regurgitation is not visualized. Pulmonic regurgitation is not visualized. Aorta: The aortic root, ascending aorta and aortic arch are all structurally normal, with no evidence of dilitation or obstruction. Venous: The inferior vena cava is normal in size with greater than 50% respiratory variability, suggesting right atrial pressure of 3 mmHg. IAS/Shunts: No atrial level shunt detected by color flow Doppler. There is no evidence of a patent foramen ovale. No ventricular septal defect is seen or detected. There is no evidence of an atrial septal defect.  LEFT VENTRICLE PLAX 2D LVIDd:  3.95 cm       Diastology LVIDs:         1.97 cm       LV e' lateral:   12.20 cm/s LV PW:         0.94 cm       LV E/e' lateral: 7.2 LV IVS:        0.98 cm       LV e' medial:    9.90 cm/s LVOT diam:     1.90 cm       LV E/e' medial:  8.9 LV SV:         56 ml LV SV Index:   28.61 LVOT Area:     2.84 cm  LV Volumes (MOD) LV area d, A2C:    23.40 cm LV area d, A4C:    28.10 cm LV area s, A2C:    13.00 cm LV area s, A4C:    15.30 cm LV major d, A2C:   7.19 cm LV major d, A4C:   7.03 cm LV major s, A2C:   5.82 cm LV major s, A4C:   6.18 cm LV vol d, MOD A2C: 63.6 ml LV vol d, MOD A4C: 91.2 ml LV vol s, MOD A2C: 24.7 ml LV vol s, MOD A4C: 32.1 ml LV SV MOD A2C:     38.9 ml LV SV MOD A4C:     91.2 ml LV SV MOD BP:      47.8 ml RIGHT VENTRICLE RV Basal diam:  3.09 cm LEFT ATRIUM             Index        RIGHT ATRIUM           Index LA diam:        3.10 cm 1.61 cm/m  RA Area:     20.10 cm LA Vol (A2C):   41.5 ml 21.52 ml/m RA Volume:   57.80 ml  29.98 ml/m LA Vol (A4C):   57.4 ml 29.77 ml/m LA Biplane Vol: 52.1 ml 27.02 ml/m  AORTIC VALVE                    PULMONIC VALVE AV Area (Vmax):    2.05 cm     PV Vmax:       1.22 m/s AV Area (Vmean):   2.00 cm     PV Vmean:      92.600 cm/s AV Area (VTI):     2.18 cm     PV VTI:        0.311 m AV Vmax:           176.00 cm/s  PV Peak grad:  6.0 mmHg AV Vmean:          115.000 cm/s PV Mean grad:  4.0 mmHg AV VTI:            0.335 m AV Peak Grad:      12.4 mmHg AV Mean Grad:      6.0 mmHg LVOT Vmax:         127.00 cm/s LVOT Vmean:        81.200 cm/s LVOT VTI:          0.257 m LVOT/AV VTI ratio: 0.77  AORTA Ao Root diam: 3.30 cm MITRAL VALVE                        TRICUSPID VALVE MV Area (PHT): 3.85 cm  TR Peak grad:   37.7 mmHg MV Peak grad:  3.6 mmHg             TR Vmax:        307.00 cm/s MV Mean grad:  1.0 mmHg MV Vmax:       0.95 m/s             SHUNTS MV Vmean:      56.0 cm/s            Systemic VTI:  0.26 m MV VTI:        0.27 m               Systemic Diam: 1.90 cm MV PHT:        57.13 msec MV Decel Time: 197 msec MV E velocity: 87.80 cm/s 103 cm/s MV A velocity: 78.10 cm/s 70.3 cm/s MV E/A ratio:  1.12       1.5  Adrian Blackwater MD Electronically signed by Adrian Blackwater MD Signature Date/Time: 11/01/2019/11:32:46 AM    Final       Subjective: Pt c/o weakness  Discharge Exam: Vitals:   11/01/19 0917 11/01/19 1404  BP:  (!) 153/74  Pulse: 63 77  Resp: 17 18  Temp:    SpO2: 93% 90%   Vitals:   11/01/19 0800 11/01/19 0915 11/01/19 0917 11/01/19 1404  BP:  124/83  (!) 153/74  Pulse: 67 68 63 77  Resp: Temp:      TempSrc:      SpO2: 92%  93% 90%  Weight:      Height:        General: Pt is alert, awake, not in acute distress Cardiovascular: S1/S2 +, no rubs, no gallops Respiratory: diminished breath sounds  otherwise clear, no wheezing, no rhonchi Abdominal: Soft, NT, ND, bowel sounds + Extremities: no edema, no cyanosis    The results of significant diagnostics from this hospitalization (including imaging, microbiology, ancillary and laboratory) are listed below for reference.     Microbiology: Recent Results (from the past 240 hour(s))  SARS CORONAVIRUS 2 (TAT 6-24 HRS) Nasopharyngeal Nasopharyngeal Swab     Status: None   Collection Time: 10/31/19 10:07 AM   Specimen: Nasopharyngeal Swab  Result Value Ref Range Status   SARS Coronavirus 2 NEGATIVE NEGATIVE Final    Comment: (NOTE) SARS-CoV-2 target nucleic acids are NOT DETECTED. The SARS-CoV-2 RNA is generally detectable in upper and lower respiratory specimens during the acute phase of infection. Negative results do not preclude SARS-CoV-2 infection, do not rule out co-infections with other pathogens, and should not be used as the sole basis for treatment or other patient management decisions. Negative results must be combined with clinical observations, patient history, and epidemiological information. The expected result is Negative. Fact Sheet for Patients: HairSlick.no Fact Sheet for Healthcare Providers: quierodirigir.com This test is not yet approved or cleared by the [place] FDA and  has been authorized for detection and/or diagnosis of SARS-CoV-2 by FDA under an Emergency Use Authorization (EUA). This EUA will remain  in effect (meaning this test can be used) for the duration of the COVID-19 declaration under Section 56 4(b)(1) of the Act, 21 U.S.C. section 360bbb-3(b)(1), unless the authorization is terminated or revoked sooner. Performed at [facility], [address] [place], [place] [address]      Labs: BNP (last 3 results) Recent Labs    10/31/19 1006  BNP 78.0   Basic Metabolic Panel: Recent  Labs  Lab 10/31/19 1007 11/01/19 0406   NA 141 142  K 4.3 3.9  CL 108 111  CO2 26 24  GLUCOSE 137* 95  BUN 14 12  CREATININE 1.14 1.22  CALCIUM 8.7* 8.5*   Liver Function Tests: Recent Labs  Lab 10/31/19 1007  AST 25  ALT 20  ALKPHOS 44  BILITOT 0.8  PROT 6.3*  ALBUMIN 3.7   Recent Labs  Lab 10/31/19 1215  LIPASE 39   No results for input(s): AMMONIA in the last 168 hours. CBC: Recent Labs  Lab 10/31/19 1007 11/01/19 0406  WBC 8.2 8.4  NEUTROABS 4.0  --   HGB 13.3 12.1*  HCT 39.9 35.0*  MCV 91.7 88.2  PLT 167 153   Cardiac Enzymes: No results for input(s): CKTOTAL, CKMB, CKMBINDEX, TROPONINI in the last 168 hours. BNP: Invalid input(s): POCBNP CBG: No results for input(s): GLUCAP in the last 168 hours. D-Dimer No results for input(s): DDIMER in the last 72 hours. Hgb A1c No results for input(s): HGBA1C in the last 72 hours. Lipid Profile No results for input(s): CHOL, HDL, LDLCALC, TRIG, CHOLHDL, LDLDIRECT in the last 72 hours. Thyroid function studies No results for input(s): TSH, T4TOTAL, T3FREE, THYROIDAB in the last 72 hours.  Invalid input(s): FREET3 Anemia work up No results for input(s): VITAMINB12, FOLATE, FERRITIN, TIBC, IRON, RETICCTPCT in the last 72 hours. Urinalysis    Component Value Date/Time   COLORURINE YELLOW (A) 10/31/2019 1007   APPEARANCEUR HAZY (A) 10/31/2019 1007   APPEARANCEUR Clear 08/03/2018 1005   LABSPEC 1.011 10/31/2019 1007   LABSPEC 1.027 11/13/2013 1832   PHURINE 7.0 10/31/2019 1007   GLUCOSEU NEGATIVE 10/31/2019 1007   GLUCOSEU Negative 11/13/2013 1832   HGBUR NEGATIVE 10/31/2019 1007   BILIRUBINUR NEGATIVE 10/31/2019 1007   BILIRUBINUR Negative 08/03/2018 1005   BILIRUBINUR Negative 11/13/2013 1832   KETONESUR NEGATIVE 10/31/2019 1007   PROTEINUR NEGATIVE 10/31/2019 1007   NITRITE NEGATIVE 10/31/2019 1007   LEUKOCYTESUR NEGATIVE 10/31/2019 1007   LEUKOCYTESUR Negative 11/13/2013 1832   Sepsis Labs Invalid input(s): PROCALCITONIN,  WBC,   LACTICIDVEN Microbiology Recent Results (from the past 240 hour(s))  SARS CORONAVIRUS 2 (TAT 6-24 HRS) Nasopharyngeal Nasopharyngeal Swab     Status: None   Collection Time: 10/31/19 10:07 AM   Specimen: Nasopharyngeal Swab  Result Value Ref Range Status   SARS Coronavirus 2 NEGATIVE NEGATIVE Final    Comment: (NOTE) SARS-CoV-2 target nucleic acids are NOT DETECTED. The SARS-CoV-2 RNA is generally detectable in upper and lower respiratory specimens during the acute phase of infection. Negative results do not preclude SARS-CoV-2 infection, do not rule out co-infections with other pathogens, and should not be used as the sole basis for treatment or other patient management decisions. Negative results must be combined with clinical observations, patient history, and epidemiological information. The expected result is Negative. Fact Sheet for Patients: HairSlick.no Fact Sheet for Healthcare Providers: quierodirigir.com This test is not yet approved or cleared by the [place] FDA and  has been authorized for detection and/or diagnosis of SARS-CoV-2 by FDA under an Emergency Use Authorization (EUA). This EUA will remain  in effect (meaning this test can be used) for the duration of the COVID-19 declaration under Section 56 4(b)(1) of the Act, 21 U.S.C. section 360bbb-3(b)(1), unless the authorization is terminated or revoked sooner. Performed at [facility], [address], [place], [place] [address]      Time coordinating discharge: Over 30 minutes  SIGNED:   Emogene Morgan  Carmelia Roller, MD  [facility] 11/01/2019, 3:38 PM Pager   If 7PM-7AM, please contact night-coverage [email or web address] Password [email or web address]

## 2019-11-01 NOTE — Progress Notes (Signed)
*  PRELIMINARY RESULTS* Echocardiogram 2D Echocardiogram has been performed.  Carlos Richard 11/01/2019, 9:09 AM

## 2019-12-05 ENCOUNTER — Emergency Department: Payer: Medicare Other

## 2019-12-05 ENCOUNTER — Emergency Department
Admission: EM | Admit: 2019-12-05 | Discharge: 2019-12-05 | Disposition: A | Discharge status: Home / Self Care | Payer: Medicare Other | Attending: Emergency Medicine | Admitting: Emergency Medicine

## 2019-12-05 DIAGNOSIS — R55 Syncope and collapse: Secondary | ICD-10-CM | POA: Insufficient documentation

## 2019-12-05 DIAGNOSIS — N183 Chronic kidney disease, stage 3 unspecified: Secondary | ICD-10-CM | POA: Insufficient documentation

## 2019-12-05 DIAGNOSIS — J449 Chronic obstructive pulmonary disease, unspecified: Secondary | ICD-10-CM | POA: Diagnosis not present

## 2019-12-05 DIAGNOSIS — Z87891 Personal history of nicotine dependence: Secondary | ICD-10-CM | POA: Insufficient documentation

## 2019-12-05 DIAGNOSIS — Z79899 Other long term (current) drug therapy: Secondary | ICD-10-CM | POA: Insufficient documentation

## 2019-12-05 DIAGNOSIS — I129 Hypertensive chronic kidney disease with stage 1 through stage 4 chronic kidney disease, or unspecified chronic kidney disease: Secondary | ICD-10-CM | POA: Diagnosis not present

## 2019-12-05 LAB — COMPREHENSIVE METABOLIC PANEL
ALT: 22 U/L (ref 0–44)
AST: 31 U/L (ref 15–41)
Albumin: 3.7 g/dL (ref 3.5–5.0)
Alkaline Phosphatase: 44 U/L (ref 38–126)
Anion gap: 8 (ref 5–15)
BUN: 11 mg/dL (ref 8–23)
CO2: 28 mmol/L (ref 22–32)
Calcium: 8.9 mg/dL (ref 8.9–10.3)
Chloride: 107 mmol/L (ref 98–111)
Creatinine, Ser: 1.15 mg/dL (ref 0.61–1.24)
GFR calc Af Amer: >60 mL/min (ref >60)
GFR calc non Af Amer: 58 mL/min — ABNORMAL LOW (ref >60)
Glucose, Bld: 126 mg/dL — ABNORMAL HIGH (ref 70–99)
Potassium: 3.7 mmol/L (ref 3.5–5.1)
Sodium: 143 mmol/L (ref 135–145)
Total Bilirubin: 0.9 mg/dL (ref 0.3–1.2)
Total Protein: 6.5 g/dL (ref 6.5–8.1)

## 2019-12-05 LAB — URINALYSIS, COMPLETE (UACMP) WITH MICROSCOPIC
Bacteria, UA: NONE SEEN
Bilirubin Urine: NEGATIVE
Glucose, UA: NEGATIVE mg/dL
Hgb urine dipstick: NEGATIVE
Ketones, ur: NEGATIVE mg/dL
Leukocytes,Ua: NEGATIVE
Nitrite: NEGATIVE
Protein, ur: NEGATIVE mg/dL
Specific Gravity, Urine: 1.01 (ref 1.005–1.030)
Squamous Epithelial / HPF: NONE SEEN (ref 0–5)
pH: 7 (ref 5.0–8.0)

## 2019-12-05 LAB — CBC WITH DIFFERENTIAL/PLATELET
Abs Immature Granulocytes: 0.02 10*3/uL (ref 0.00–0.07)
Basophils Absolute: 0.1 10*3/uL (ref 0.0–0.1)
Basophils Relative: 1 %
Eosinophils Absolute: 0.4 10*3/uL (ref 0.0–0.5)
Eosinophils Relative: 4 %
HCT: 43.6 % (ref 39.0–52.0)
Hemoglobin: 14.2 g/dL (ref 13.0–17.0)
Immature Granulocytes: 0 %
Lymphocytes Relative: 44 %
Lymphs Abs: 4.2 10*3/uL — ABNORMAL HIGH (ref 0.7–4.0)
MCH: 29.8 pg (ref 26.0–34.0)
MCHC: 32.6 g/dL (ref 30.0–36.0)
MCV: 91.6 fL (ref 80.0–100.0)
Monocytes Absolute: 0.7 10*3/uL (ref 0.1–1.0)
Monocytes Relative: 7 %
Neutro Abs: 4.3 10*3/uL (ref 1.7–7.7)
Neutrophils Relative %: 44 %
Platelets: 189 10*3/uL (ref 150–400)
RBC: 4.76 MIL/uL (ref 4.22–5.81)
RDW: 12.7 % (ref 11.5–15.5)
WBC: 9.7 10*3/uL (ref 4.0–10.5)
nRBC: 0 % (ref 0.0–0.2)

## 2019-12-05 LAB — GLUCOSE, CAPILLARY: Glucose-Capillary: 100 mg/dL — ABNORMAL HIGH (ref 70–99)

## 2019-12-05 LAB — TROPONIN I (HIGH SENSITIVITY)
Troponin I (High Sensitivity): 6 ng/L (ref <18)
Troponin I (High Sensitivity): 7 ng/L (ref <18)

## 2019-12-05 NOTE — Discharge Instructions (Addendum)
This could be related to vasovagal syncope from having the bowel movement versus from your heart.  Your EKG and your heart markers were reassuring and we discussed admission versus discharge home with close cardiology follow-up.  Given you had a recent work-up for syncope a month ago you prefer to go home.  You should call the cardiology clinic to confirm your appointment with him on Friday at 9 AM.  If you pass out again you should return to the ER immediately.

## 2019-12-05 NOTE — ED Provider Notes (Signed)
[facility] Emergency Department Provider Note  ____________________________________________   First MD Initiated Contact with Patient 12/05/19 [phone]     (approximate)  I have reviewed the triage vital signs and the nursing notes.   HISTORY  Chief Complaint Near Syncope    HPI Carlos Richard is a 86 y.o. male with atrial fibrillation on Xarelto, COPD, hypertension, CKD, hyperlipidemia who comes in for syncope.  Patient had admission in December for syncope.  During these episodes his heart rates were in the 40s to 50s.  Echo was done that showed an EF of 60 to 65%.  Carotid ultrasound not show any significant stenosis.  Cardiology was consulted recommended outpatient work-up with event monitor and evaluate for pacemaker.  Patient was supposed to follow-up with Dr. Welton Flakes on 12/21 at 9 AM.  Patient states that he remembers getting up to use the bathroom but that is the last thing that he remembers.  He was found down on the ground by EMS with a bowel movement in the toilet.  Patient denies any headaches, neck stiffness, neck pain, chest pain, shortness of breath, abdominal pain at this time.  He states that he just feels little chilly.  He states that he had not followed up with cardiology.  Syncope occurred 1 time, during a bowel movement.  Stool was a little bit harder than normal, came back better on his own after a few seconds.           Past Medical History:  Diagnosis Date  . Atrial fibrillation (HCC)   . COPD (chronic obstructive pulmonary disease) (HCC)   . Hypertension   . Near syncope   . Tremors of nervous system     Patient Active Problem List   Diagnosis Date Noted  . HTN (hypertension) 10/31/2019  . Near syncope 10/31/2019  . Bradycardia   . CKD (chronic kidney disease) stage 3, GFR 30-59 ml/min 08/07/2018  . Gout 08/03/2018  . Mixed hyperlipidemia 08/03/2018  . Benign hypertensive renal disease   . COPD (chronic obstructive pulmonary  disease) (HCC)   . Atrial fibrillation (HCC)   . 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 Essential tremor 01/19/2011    Past Surgical History:  Procedure Laterality Date  . BACK SURGERY    . BRAIN SURGERY     stimulator placement for tremors    Prior to Admission medications   Medication Sig Start Date End Date Taking? Authorizing Provider  allopurinol (ZYLOPRIM) 100 MG tablet Take 100 mg by mouth 3 (three) times a week. Monday, Wednesday, Friday 08/08/19   [provider]  finasteride (PROSCAR) 5 MG tablet Take 1 tablet (5 mg total) by mouth daily. 05/28/19   Johnson, Megan P, DO  furosemide (LASIX) 20 MG tablet Take 20 mg by mouth 3 (three) times a week. Monday, Wednesday, Friday 10/18/17   [provider]  losartan (COZAAR) 50 MG tablet Take 1 tablet (50 mg total) by mouth daily. 02/13/19   Johnson, Megan P, DO  magnesium oxide (MAG-OX) 400 MG tablet Take 400 mg by mouth daily.    [provider]  metoprolol tartrate (LOPRESSOR) 25 MG tablet Take 25 mg by mouth daily.    [provider]  Multiple Vitamins-Minerals (ZINC PO) Take 1 tablet by mouth daily.    [provider]  simvastatin (ZOCOR) 20 MG tablet Take 1 tablet (20 mg total) by mouth  daily. 02/13/19   Olevia Perches P, DO  terazosin (HYTRIN) 5 MG capsule Take 1 capsule (5 mg total) by mouth at bedtime. 05/23/19   Dorcas Carrow, DO  UNABLE TO FIND [facility]    [provider]  vitamin E 200 UNIT capsule Take 200 Units by mouth daily.    [provider]  XARELTO 15 MG TABS tablet Take 15 mg by mouth daily with supper.  07/28/17   [provider]  Zinc 50 MG TABS Take 50 mg by mouth daily.     [provider]    Allergies Patient has no known allergies.  Family History  Problem Relation Age of Onset  . Tuberculosis Mother   . Tremor  Paternal Grandmother   . Obesity Sister   . Prostate cancer Neg Hx   . Bladder Cancer Neg Hx   . Kidney cancer Neg Hx     Social History Social History   Tobacco Use  . Smoking status: Former Smoker    Types: Cigarettes    Quit date: 1980    Years since quitting: 41.0  . Smokeless tobacco: Never Used  . Tobacco comment: quit 40 years ago   Substance Use Topics  . Alcohol use: Yes    Comment: occasionally  . Drug use: No      Review of Systems Constitutional: No fever/chills Eyes: No visual changes. ENT: No sore throat. Cardiovascular: Denies chest pain.  Positive syncope Respiratory: Denies shortness of breath. Gastrointestinal: No abdominal pain.  No nausea, no vomiting.  No diarrhea.  No constipation. Genitourinary: Negative for dysuria. Musculoskeletal: Negative for back pain. Skin: Negative for rash. Neurological: Negative for headaches, focal weakness or numbness. All other ROS negative ____________________________________________   PHYSICAL EXAM:  VITAL SIGNS: ED Triage Vitals  Enc Vitals Group     BP 12/05/19 0634 (!) 164/72     Pulse Rate 12/05/19 0630 (!) 51     Resp 12/05/19 0630 20     Temp 12/05/19 0634 98.1 F (36.7 C)     Temp Source 12/05/19 0634 Oral     SpO2 12/05/19 0630 95 %     Weight 12/05/19 0631 165 lb (74.8 kg)     Height 12/05/19 0631 5' 9.5" (1.765 m)     Head Circumference --      Peak Flow --      Pain Score 12/05/19 0631 0     Pain Loc --      Pain Edu? --      Excl. in GC? --     Constitutional: Alert and oriented. Well appearing and in no acute distress. Eyes: Conjunctivae are normal. EOMI. Head: Atraumatic.  Deep brain stimulator on the left side of his head Nose: No congestion/rhinnorhea. Mouth/Throat: Mucous membranes are moist.   Neck: No stridor. Trachea Midline. FROM Cardiovascular: Bradycardic, regular rhythm. Grossly normal heart sounds.  Good peripheral circulation. Respiratory: Normal respiratory effort.  No  retractions. Lungs CTAB. Gastrointestinal: Soft and nontender. No distention. No abdominal bruits.  Musculoskeletal: No lower extremity tenderness nor edema.  No joint effusions. Neurologic:  Normal speech and language. No gross focal neurologic deficits are appreciated.  Equal strength in his arms and legs. Skin:  Skin is warm, dry and intact. No rash noted. Psychiatric: Mood and affect are normal. Speech and behavior are normal. GU: Deferred   ____________________________________________   LABS (all labs ordered are listed, but only abnormal results are displayed)  Labs Reviewed  CBC WITH DIFFERENTIAL/PLATELET - Abnormal;  Notable for the following components:      Result Value   Lymphs Abs 4.2 (*)    All other components within normal limits  URINALYSIS, COMPLETE (UACMP) WITH MICROSCOPIC - Abnormal; Notable for the following components:   Color, Urine YELLOW (*)    APPearance CLEAR (*)    All other components within normal limits  COMPREHENSIVE METABOLIC PANEL - Abnormal; Notable for the following components:   Glucose, Bld 126 (*)    GFR calc non Af Amer 58 (*)    All other components within normal limits  GLUCOSE, CAPILLARY - Abnormal; Notable for the following components:   Glucose-Capillary 100 (*)    All other components within normal limits  CBG MONITORING, ED  TROPONIN I (HIGH SENSITIVITY)  TROPONIN I (HIGH SENSITIVITY)  TROPONIN I (HIGH SENSITIVITY)   ____________________________________________   ED ECG REPORT I, Vanessa Carlsborg, the attending physician, personally viewed and interpreted this ECG.  Sinus brady rate of 50, no st elevation, twi in III, AVF, normal intervals. Twi are new.  ____________________________________________  RADIOLOGY   Official radiology report(s): CT Head Wo Contrast  Result Date: 12/05/2019 CLINICAL DATA:  Syncope EXAM: CT HEAD WITHOUT CONTRAST TECHNIQUE: Contiguous axial images were obtained from the base of the skull through the  vertex without intravenous contrast. COMPARISON:  October 31, 2019 FINDINGS: Brain: Deep brain stimulator is again noted, placed from a left frontal approach with the tip at the lateral most aspect of the left midbrain, stable. There is mild diffuse atrophy, stable. A small cavum septum pellucidum represents an anatomic variant. There is no intracranial mass, hemorrhage, extra-axial fluid collection, or midline shift. There is stable patchy small vessel disease in the centra semiovale bilaterally. No evident acute infarct. Vascular: No hyperdense vessel. There is calcification in each carotid siphon region. Skull: Bony defect at site of stimulator placement in the left frontal bone region. Bony calvarium otherwise intact. Sinuses/Orbits: There is mucosal thickening in several ethmoid air cells. There is leftward deviation of the nasal septum. Orbits appear symmetric bilaterally. There is evidence of previous cataract removals bilaterally. Other: Visualized mastoid air cells are clear. IMPRESSION: Stable atrophy with periventricular small vessel disease. No acute infarct evident. No mass or hemorrhage. Stable positioning of deep brain stimulator lead. Foci of arterial vascular calcification noted. There is mucosal thickening in several ethmoid air cells. There is leftward deviation of the nasal septum. Electronically Signed   By: Lowella Grip III M.D.   On: 12/05/2019 08:00    ____________________________________________   PROCEDURES  Procedure(s) performed (including Critical Care):  Procedures   ____________________________________________   INITIAL IMPRESSION / ASSESSMENT AND PLAN / ED COURSE  Carlos Richard was evaluated in Emergency Department on 12/05/2019 for the symptoms described in the history of present illness. He was evaluated in the context of the global COVID-19 pandemic, which necessitated consideration that the patient might be at risk for infection with the SARS-CoV-2 virus  that causes COVID-19. Institutional protocols and algorithms that pertain to the evaluation of patients at risk for COVID-19 are in a state of rapid change based on information released by regulatory bodies including the CDC and federal and state organizations. These policies and algorithms were followed during the patient's care in the ED.    Patient presents with syncope.  Denies any symptoms to suggest PE.  Denies any shortness of breath or chest discomfort to suggest ACS.  Will get cardiac markers to evaluate for arrhythmia.  No abdominal pain to suggest AAA  rupture.  Will get labs to evaluate for electrolyte abnormalities, anemia, UTI.  If work-up is reassuring that could be related to vasovagal given it happened during a bowel movement versus his bradycardia.  He did not follow-up with cardiology for event monitor.  Labs are reassuring no evidence of anemia, electrolyte abnormalities, AKI initial troponin was negative but will get repeat troponin.  CT scan of his head shows stable position of his deep brain stimulator but no acute bleed.  Repeat troponin is negative.  Rediscussed with patient offered admission for cardiac monitoring although he is already had a full syncope work-up with echo and ultrasound within the last month and cardiac monitoring..  We also discussed discharged home given this could have been vasovagal as long as he would have close follow-up with cardiology.  Patient would prefer to follow-up with cardiology outpatient.  He understands that he may have another event at home which would require him to return to the ER.  I did discuss the case with Dr. Welton Flakes his cardiologist who was able to get him an appointment for Friday at 9 AM to get a event monitor.  Patient states he feels comfortable going home.  Patient is able to ambulate around the room and feels at his baseline.  I have lower suspicion for a arrhythmia at this time given his cardiac markers were negative and given that he  is able to have full close follow-up for the Holter monitor he felt comfortable going home.  Heart rates have been in the 50s to 70s in the ER.  On review of prior heart rates they have ranged in the 50s to 70s over the past year.  His EKG did confirm that it was sinus in nature.   I discussed the provisional nature of ED diagnosis, the treatment so far, the ongoing plan of care, follow up appointments and return precautions with the patient and any family or support people present. They expressed understanding and agreed with the plan, discharged home.   ____________________________________________   FINAL CLINICAL IMPRESSION(S) / ED DIAGNOSES   Final diagnoses:  Syncope and collapse      MEDICATIONS GIVEN DURING THIS VISIT:  Medications - No data to display   ED Discharge Orders    None       Note:  This document was prepared using Dragon voice recognition software and may include unintentional dictation errors.   Concha Se, MD 12/05/19 [phone]

## 2019-12-05 NOTE — ED Triage Notes (Signed)
Patient was at home in bathroom when wife called 911. Wife reported to EMS patient was on toilet and stated he felt he was going to pass out. On EMS arrival at home patient was in the bathroom floor with stool in the toilet. On arrival to ED patient is alert, oriented x 4. States he does not remember the events at home. EMS states on arrival that patient was bradycardic with HR in upper 40's

## 2019-12-05 NOTE — ED Notes (Signed)
Patient stats he does not remember events leading to EMS arrival. States he believes he may have passed out due to situation. Patient denies any pain at this time. Warm blanket provided. Patient resting comfortably in bed

## 2019-12-07 ENCOUNTER — Ambulatory Visit (INDEPENDENT_AMBULATORY_CARE_PROVIDER_SITE_OTHER): Payer: Medicare Other | Admitting: Family Medicine

## 2019-12-07 ENCOUNTER — Encounter: Payer: Self-pay | Admitting: Family Medicine

## 2019-12-07 ENCOUNTER — Other Ambulatory Visit: Payer: Self-pay

## 2019-12-07 VITALS — BP 111/67 | HR 70 | Temp 98.1°F

## 2019-12-07 DIAGNOSIS — N138 Other obstructive and reflux uropathy: Secondary | ICD-10-CM

## 2019-12-07 DIAGNOSIS — I129 Hypertensive chronic kidney disease with stage 1 through stage 4 chronic kidney disease, or unspecified chronic kidney disease: Secondary | ICD-10-CM | POA: Diagnosis not present

## 2019-12-07 DIAGNOSIS — J439 Emphysema, unspecified: Secondary | ICD-10-CM | POA: Diagnosis not present

## 2019-12-07 DIAGNOSIS — M1A9XX1 Chronic gout, unspecified, with tophus (tophi): Secondary | ICD-10-CM

## 2019-12-07 DIAGNOSIS — R55 Syncope and collapse: Secondary | ICD-10-CM

## 2019-12-07 DIAGNOSIS — I4891 Unspecified atrial fibrillation: Secondary | ICD-10-CM

## 2019-12-07 DIAGNOSIS — N183 Chronic kidney disease, stage 3 unspecified: Secondary | ICD-10-CM

## 2019-12-07 DIAGNOSIS — Z0000 Encounter for general adult medical examination without abnormal findings: Secondary | ICD-10-CM

## 2019-12-07 DIAGNOSIS — E782 Mixed hyperlipidemia: Secondary | ICD-10-CM

## 2019-12-07 DIAGNOSIS — R739 Hyperglycemia, unspecified: Secondary | ICD-10-CM

## 2019-12-07 DIAGNOSIS — N401 Enlarged prostate with lower urinary tract symptoms: Secondary | ICD-10-CM

## 2019-12-07 DIAGNOSIS — N1831 Chronic kidney disease, stage 3a: Secondary | ICD-10-CM

## 2019-12-07 LAB — MICROALBUMIN, URINE WAIVED
Creatinine, Urine Waived: 50 mg/dL (ref 10–300)
Microalb, Ur Waived: 303 mg/L — ABNORMAL HIGH (ref 0–19)

## 2019-12-07 LAB — UA/M W/RFLX CULTURE, ROUTINE
Bilirubin, UA: NEGATIVE
Glucose, UA: NEGATIVE
Ketones, UA: NEGATIVE
Leukocytes,UA: NEGATIVE
Nitrite, UA: NEGATIVE
Specific Gravity, UA: <1.005 — ABNORMAL LOW (ref 1.005–1.030)
Urobilinogen, Ur: 0.2 mg/dL (ref 0.2–1.0)
pH, UA: 5.5 (ref 5.0–7.5)

## 2019-12-07 LAB — MICROSCOPIC EXAMINATION
Bacteria, UA: NONE SEEN
WBC, UA: NONE SEEN /hpf (ref 0–5)

## 2019-12-07 LAB — BAYER DCA HB A1C WAIVED: HB A1C (BAYER DCA - WAIVED): 5.7 % (ref <7.0)

## 2019-12-07 MED ORDER — TERAZOSIN HCL 5 MG PO CAPS: 5.0000 mg | ORAL_CAPSULE | Freq: Every day | ORAL | 1 refills | Status: DC

## 2019-12-07 MED ORDER — FLUTICASONE-SALMETEROL 250-50 MCG/DOSE IN AEPB: 1.0000 | INHALATION_SPRAY | Freq: Two times a day (BID) | RESPIRATORY_TRACT | 12 refills | Status: DC

## 2019-12-07 MED ORDER — FINASTERIDE 5 MG PO TABS: 5.0000 mg | ORAL_TABLET | Freq: Every day | ORAL | 1 refills | Status: DC

## 2019-12-07 MED ORDER — ALLOPURINOL 100 MG PO TABS: 100.0000 mg | ORAL_TABLET | ORAL | 1 refills | Status: DC

## 2019-12-07 NOTE — Patient Instructions (Addendum)
Debrox- ear wax removal  We are recommending the vaccine to everyone who has not had an allergic reaction to any of the components of the vaccine. If you have specific questions about the vaccine, please bring them up with your health care provider to discuss them.   We will likely not be getting the vaccine in the office for the first rounds of vaccinations. The way they are releasing the vaccines is going to be through the health systems (like [place], [facility], [facility], [place]) or through your county health department.   The [facility] Department is giving vaccines to those 75+ starting 11/21/19  M-F 7AM to 4PM [facility] [employer] [facility] [address] [place], [place] [facility] [employer] [facility] [employer] in a drive through tent  If you are 65+ you can get a vaccine through [facility] by signing up for an appointment.  You can sign up by going to: [email or web address].  You can get more information by going to: [email or web address]

## 2019-12-07 NOTE — Progress Notes (Signed)
BP 111/67   Pulse 70   Temp 98.1 F (36.7 C)   SpO2 95%    Subjective:    Patient ID: Carlos Richard, male    DOB: 1932/05/16, 86 y.o.   MRN: [id number]  HPI: Carlos Richard is a 86 y.o. male presenting on 12/07/2019 for comprehensive medical examination. Current medical complaints include:  ER FOLLOW UP- Saw Dr. Welton Flakes this AM- did a carotid US and a bunch of tests. He'll talk about the results on Monday.  Time since discharge: 2 days   Hospital/facility: [facility] Diagnosis: syncope Procedures/tests: CT head Consultants: None New medications: None Discharge instructions: Follow up with cardiology  Status: stable  HYPERTENSION / HYPERLIPIDEMIA Satisfied with current treatment? yes Duration of hypertension: chronic BP monitoring frequency: not checking BP range:  BP medication side effects: no Past BP meds: losartan, lasix Duration of hyperlipidemia: chronic Cholesterol medication side effects: no Cholesterol supplements: none Past cholesterol medications: simvastatin Medication compliance: excellent compliance Aspirin: no Recent stressors: no Recurrent headaches: no Visual changes: no Palpitations: no Dyspnea: no Chest pain: no Lower extremity edema: no Dizzy/lightheaded: no  BPH BPH status: controlled Satisfied with current treatment?: yes Medication side effects: no Medication compliance: excellent compliance Duration: chronic Nocturia: 1-2x per night Urinary frequency:no Incomplete voiding: no Urgency: no Weak urinary stream: no Straining to start stream: no Dysuria: no Onset: gradual Severity: moderate  No gout flares- has been doing well  Depression Screen done today and results listed below:  Depression screen [facility] 2/9 01/31/2019 08/03/2018  Decreased Interest 0 0  Down, Depressed, Hopeless 0 0  PHQ - 2 Score 0 0    Past Medical History:  Past Medical History:  Diagnosis Date  . Atrial fibrillation (HCC)   . COPD (chronic obstructive  pulmonary disease) (HCC)   . Hypertension   . Near syncope   . Tremors of nervous system     Surgical History:  Past Surgical History:  Procedure Laterality Date  . BACK SURGERY    . BRAIN SURGERY     stimulator placement for tremors    Medications:  Current Outpatient Medications on File Prior to Visit  Medication Sig  . furosemide (LASIX) 20 MG tablet Take 20 mg by mouth 3 (three) times a week. Monday, Wednesday, Friday  . losartan (COZAAR) 50 MG tablet Take 1 tablet (50 mg total) by mouth daily.  . magnesium oxide (MAG-OX) 400 MG tablet Take 400 mg by mouth daily.  . Multiple Vitamins-Minerals (ZINC PO) Take 1 tablet by mouth daily.  . simvastatin (ZOCOR) 20 MG tablet Take 1 tablet (20 mg total) by mouth daily.  Marland Kitchen UNABLE TO FIND CuraMed  . vitamin E 200 UNIT capsule Take 200 Units by mouth daily.  [name] 15 MG TABS tablet Take 15 mg by mouth daily with supper.   . Zinc 50 MG TABS Take 50 mg by mouth daily.    No current facility-administered medications on file prior to visit.    Allergies:  No Known Allergies  Social History:  Social History   Socioeconomic History  . Marital status: Married    Spouse name: Not on file  . Number of children: Not on file  . Years of education: Not on file  . Highest education level: Associate degree: academic program  Occupational History  . Not on file  Tobacco Use  . Smoking status: Former Smoker    Types: Cigarettes    Quit date: 1980    Years since quitting: 41.0  .  Smokeless tobacco: Never Used  . Tobacco comment: quit 40 years ago   Substance and Sexual Activity  . Alcohol use: Yes    Comment: occasionally  . Drug use: No  . Sexual activity: Yes  Other Topics Concern  . Not on file  Social History Narrative  . Not on file   Social Determinants of Health   Financial Resource Strain: Low Risk   . Difficulty of Paying Living Expenses: Not hard at all  Food Insecurity: No Food Insecurity  . Worried About  Programme researcher, broadcasting/film/video in the Last Year: Never true  . Ran Out of Food in the Last Year: Never true  Transportation Needs: No Transportation Needs  . Lack of Transportation (Medical): No  . Lack of Transportation (Non-Medical): No  Physical Activity: Inactive  . Days of Exercise per Week: 0 days  . Minutes of Exercise per Session: 0 min  Stress: No Stress Concern Present  . Feeling of Stress : Not at all  Social Connections: Somewhat Isolated  . Frequency of Communication with Friends and Family: More than three times a week  . Frequency of Social Gatherings with Friends and Family: More than three times a week  . Attends Religious Services: Never  . Active Member of Clubs or Organizations: No  . Attends Banker Meetings: Never  . Marital Status: Married  Catering manager Violence: Not At Risk  . Fear of Current or Ex-Partner: No  . Emotionally Abused: No  . Physically Abused: No  . Sexually Abused: No   Social History   Tobacco Use  Smoking Status Former Smoker  . Types: Cigarettes  . Quit date: 36  . Years since quitting: 41.0  Smokeless Tobacco Never Used  Tobacco Comment   quit 40 years ago    Social History   Substance and Sexual Activity  Alcohol Use Yes   Comment: occasionally    Family History:  Family History  Problem Relation Age of Onset  . Tuberculosis Mother   . Tremor Paternal Grandmother   . Obesity Sister   . Prostate cancer Neg Hx   . Bladder Cancer Neg Hx   . Kidney cancer Neg Hx     Past medical history, surgical history, medications, allergies, family history and social history reviewed with patient today and changes made to appropriate areas of the chart.   Review of Systems  Constitutional: Negative.   HENT: Positive for hearing loss. Negative for congestion, ear discharge, ear pain, nosebleeds, sinus pain, sore throat and tinnitus.   Eyes: Positive for blurred vision. Negative for double vision, photophobia, pain, discharge  and redness.  Respiratory: Positive for shortness of breath. Negative for cough, hemoptysis, sputum production, wheezing and stridor.   Cardiovascular: Negative.   Gastrointestinal: Positive for heartburn. Negative for abdominal pain, blood in stool, constipation, diarrhea, melena, nausea and vomiting.  Genitourinary: Negative.   Musculoskeletal: Negative.   Skin: Negative.   Neurological: Positive for dizziness. Negative for tingling, tremors, sensory change, speech change, focal weakness, seizures, loss of consciousness, weakness and headaches.  Endo/Heme/Allergies: Negative for environmental allergies and polydipsia. Bruises/bleeds easily.  Psychiatric/Behavioral: Negative.     All other ROS negative except what is listed above and in the HPI.      Objective:    BP 111/67   Pulse 70   Temp 98.1 F (36.7 C)   SpO2 95%   Wt Readings from Last 3 Encounters:  12/05/19 165 lb (74.8 kg)  10/31/19 170 lb (77.1 kg)  02/13/19 165 lb (74.8 kg)    Physical Exam Vitals and nursing note reviewed.  Constitutional:      General: He is not in acute distress.    Appearance: Normal appearance. He is normal weight. He is not ill-appearing, toxic-appearing or diaphoretic.  HENT:     Head: Normocephalic and atraumatic.     Right Ear: Tympanic membrane, ear canal and external ear normal. There is no impacted cerumen.     Left Ear: Tympanic membrane, ear canal and external ear normal. There is no impacted cerumen.     Nose: Nose normal. No congestion or rhinorrhea.     Mouth/Throat:     Mouth: Mucous membranes are moist.     Pharynx: Oropharynx is clear. No oropharyngeal exudate or posterior oropharyngeal erythema.  Eyes:     General: No scleral icterus.       Right eye: No discharge.        Left eye: No discharge.     Extraocular Movements: Extraocular movements intact.     Conjunctiva/sclera: Conjunctivae normal.     Pupils: Pupils are equal, round, and reactive to light.  Neck:      Vascular: No carotid bruit.  Cardiovascular:     Rate and Rhythm: Normal rate and regular rhythm.     Pulses: Normal pulses.     Heart sounds: No murmur. No friction rub. No gallop.   Pulmonary:     Effort: Pulmonary effort is normal. No respiratory distress.     Breath sounds: Normal breath sounds. No stridor. No wheezing, rhonchi or rales.  Chest:     Chest wall: No tenderness.  Abdominal:     General: Abdomen is flat. Bowel sounds are normal. There is no distension.     Palpations: Abdomen is soft. There is no mass.     Tenderness: There is no abdominal tenderness. There is no right CVA tenderness, left CVA tenderness, guarding or rebound.     Hernia: No hernia is present.  Genitourinary:    Comments: Genital exam deferred with shared decision making Musculoskeletal:        General: No swelling, tenderness, deformity or signs of injury.     Cervical back: Normal range of motion and neck supple. No rigidity. No muscular tenderness.     Right lower leg: No edema.     Left lower leg: No edema.  Lymphadenopathy:     Cervical: No cervical adenopathy.  Skin:    General: Skin is warm and dry.     Capillary Refill: Capillary refill takes less than 2 seconds.     Coloration: Skin is not jaundiced or pale.     Findings: No bruising, erythema, lesion or rash.  Neurological:     General: No focal deficit present.     Mental Status: He is alert and oriented to person, place, and time.     Cranial Nerves: No cranial nerve deficit.     Sensory: No sensory deficit.     Motor: No weakness.     Coordination: Coordination normal.     Gait: Gait normal.     Deep Tendon Reflexes: Reflexes normal.  Psychiatric:        Mood and Affect: Mood normal.        Behavior: Behavior normal.        Thought Content: Thought content normal.        Judgment: Judgment normal.     Results for orders placed or performed in visit on 12/07/19  Microscopic  Examination   BLD  Result Value Ref Range   WBC,  UA None seen 0 - 5 /hpf   RBC 0-2 0 - 2 /hpf   Epithelial Cells (non renal) 0-10 0 - 10 /hpf   Bacteria, UA None seen None seen/Few  Bayer DCA Hb A1c Waived  Result Value Ref Range   HB A1C (BAYER DCA - WAIVED) 5.7 <7.0 %  Uric acid  Result Value Ref Range   Uric Acid 5.6 3.8 - 8.4 mg/dL  UA/M w/rflx Culture, Routine   Specimen: Blood   BLD  Result Value Ref Range   Specific Gravity, UA <1.005 (L) 1.005 - 1.030   pH, UA 5.5 5.0 - 7.5   Color, UA Yellow Yellow   Appearance Ur Clear Clear   Leukocytes,UA Negative Negative   Protein,UA Trace (A) Negative/Trace   Glucose, UA Negative Negative   Ketones, UA Negative Negative   RBC, UA Trace (A) Negative   Bilirubin, UA Negative Negative   Urobilinogen, Ur 0.2 0.2 - 1.0 mg/dL   Nitrite, UA Negative Negative   Microscopic Examination See below:   TSH  Result Value Ref Range   TSH 0.758 0.450 - 4.500 uIU/mL  Microalbumin, Urine Waived  Result Value Ref Range   Microalb, Ur Waived 303 (H) 0 - 19 mg/L   Creatinine, Urine Waived 50 10 - 300 mg/dL   Microalb/Creat Ratio 30-300 (H) <30 mg/g  Lipid Panel w/o Chol/HDL Ratio  Result Value Ref Range   Cholesterol, Total 131 100 - 199 mg/dL   Triglycerides 793 0 - 149 mg/dL   HDL 40 >90 mg/dL   VLDL Cholesterol Cal 24 5 - 40 mg/dL   LDL Chol Calc (NIH) 67 0 - 99 mg/dL  Comprehensive metabolic panel  Result Value Ref Range   Glucose 119 (H) 65 - 99 mg/dL   BUN 13 8 - 27 mg/dL   Creatinine, Ser 3.00 0.76 - 1.27 mg/dL   GFR calc non Af Amer 59 (L) >59 mL/min/1.73   GFR calc Af Amer 68 >59 mL/min/1.73   BUN/Creatinine Ratio 12 10 - 24   Sodium 142 134 - 144 mmol/L   Potassium 4.0 3.5 - 5.2 mmol/L   Chloride 104 96 - 106 mmol/L   CO2 27 20 - 29 mmol/L   Calcium 9.1 8.6 - 10.2 mg/dL   Total Protein 6.5 6.0 - 8.5 g/dL   Albumin 3.9 3.6 - 4.6 g/dL   Globulin, Total 2.6 1.5 - 4.5 g/dL   Albumin/Globulin Ratio 1.5 1.2 - 2.2   Bilirubin Total 0.8 0.0 - 1.2 mg/dL   Alkaline Phosphatase  56 39 - 117 IU/L   AST 26 0 - 40 IU/L   ALT 19 0 - 44 IU/L  CBC with Differential/Platelet  Result Value Ref Range   WBC 8.6 3.4 - 10.8 x10E3/uL   RBC 4.50 4.14 - 5.80 x10E6/uL   Hemoglobin 14.0 13.0 - 17.7 g/dL   Hematocrit 92.3 30.0 - 51.0 %   MCV 90 79 - 97 fL   MCH 31.1 26.6 - 33.0 pg   MCHC 34.6 31.5 - 35.7 g/dL   RDW 76.2 26.3 - 33.5 %   Platelets 190 150 - 450 x10E3/uL   Neutrophils 53 Not Estab. %   Lymphs 36 Not Estab. %   Monocytes 7 Not Estab. %   Eos 3 Not Estab. %   Basos 1 Not Estab. %   Neutrophils Absolute 4.6 1.4 - 7.0 x10E3/uL   Lymphocytes Absolute  3.1 0.7 - 3.1 x10E3/uL   Monocytes Absolute 0.6 0.1 - 0.9 x10E3/uL   EOS (ABSOLUTE) 0.2 0.0 - 0.4 x10E3/uL   Basophils Absolute 0.1 0.0 - 0.2 x10E3/uL   Immature Granulocytes 0 Not Estab. %   Immature Grans (Abs) 0.0 0.0 - 0.1 x10E3/uL      Assessment & Plan:   Problem List Items Addressed This Visit      Cardiovascular and Mediastinum   Atrial fibrillation [facility](HCC)    Following with cardiology. Call with any concerns. Continue to monitor. Continue xarelto.      Relevant Medications   terazosin (HYTRIN) 5 MG capsule   Near syncope    Working with Cardiology. Just had tests done today. Continue to monitor. Call with any concerns.       Relevant Medications   terazosin (HYTRIN) 5 MG capsule     Respiratory   COPD (chronic obstructive pulmonary disease) (HCC)    Under good control on current regimen. Continue current regimen. Continue to monitor. Call with any concerns. Refills given. Labs drawn today.       Relevant Medications   Fluticasone-Salmeterol (ADVAIR) 250-50 MCG/DOSE AEPB     Genitourinary   BPH with obstruction/lower urinary tract symptoms    Under good control on current regimen. Continue current regimen. Continue to monitor. Call with any concerns. Refills given. Labs drawn today.       Relevant Medications   finasteride (PROSCAR) 5 MG tablet   Benign hypertensive renal disease     Under good control on current regimen. Continue current regimen. Continue to monitor. Call with any concerns. Refills given. Labs drawn today.       CKD (chronic kidney disease) stage 3, GFR 30-59 ml/min    Rechecking labs today. Await results. Call with any concerns.         Other   Gout    Under good control on current regimen. Continue current regimen. Continue to monitor. Call with any concerns. Refills given. Labs drawn today.       Mixed hyperlipidemia    Under good control on current regimen. Continue current regimen. Continue to monitor. Call with any concerns. Refills given. Labs drawn today.       Relevant Medications   terazosin (HYTRIN) 5 MG capsule    Other Visit Diagnoses    Routine general medical examination at a health care facility    -  Primary   Vaccines up to date. Screening labs checked today. Continue diet and exercise. Call with any concerns.    Hyperglycemia       Labs checked today. Await results.   Relevant Orders   Bayer DCA Hb A1c Waived (Completed)       Discussed aspirin prophylaxis for myocardial infarction prevention and decision was it was not indicated  LABORATORY TESTING:  Health maintenance labs ordered today as discussed above.    IMMUNIZATIONS:   - Tdap: Tetanus vaccination status reviewed: last tetanus booster within 10 years. - Influenza: Up to date - Pneumovax: Up to date - Prevnar: Up to date  SCREENING: - Colonoscopy: Not applicable  Discussed with patient purpose of the colonoscopy is to detect colon cancer at curable precancerous or early stages    PATIENT COUNSELING:    Sexuality: Discussed sexually transmitted diseases, partner selection, use of condoms, avoidance of unintended pregnancy  and contraceptive alternatives.   Advised to avoid cigarette smoking.  I discussed with the patient that most people either abstain from alcohol or drink within safe limits (<=  14/week and <=4 drinks/occasion for males, <=7/weeks  and <= 3 drinks/occasion for females) and that the risk for alcohol disorders and other health effects rises proportionally with the number of drinks per week and how often a drinker exceeds daily limits.  Discussed cessation/primary prevention of drug use and availability of treatment for abuse.   Diet: Encouraged to adjust caloric intake to maintain  or achieve ideal body weight, to reduce intake of dietary saturated fat and total fat, to limit sodium intake by avoiding high sodium foods and not adding table salt, and to maintain adequate dietary potassium and calcium preferably from fresh fruits, vegetables, and low-fat dairy products.    stressed the importance of regular exercise  Injury prevention: Discussed safety belts, safety helmets, smoke detector, smoking near bedding or upholstery.   Dental health: Discussed importance of regular tooth brushing, flossing, and dental visits.   Follow up plan: NEXT PREVENTATIVE PHYSICAL DUE IN 1 YEAR. Return in about 3 months (around 03/06/2020).

## 2019-12-08 LAB — COMPREHENSIVE METABOLIC PANEL
ALT: 19 IU/L (ref 0–44)
AST: 26 IU/L (ref 0–40)
Albumin/Globulin Ratio: 1.5 (ref 1.2–2.2)
Albumin: 3.9 g/dL (ref 3.6–4.6)
Alkaline Phosphatase: 56 IU/L (ref 39–117)
BUN/Creatinine Ratio: 12 (ref 10–24)
BUN: 13 mg/dL (ref 8–27)
Bilirubin Total: 0.8 mg/dL (ref 0.0–1.2)
CO2: 27 mmol/L (ref 20–29)
Calcium: 9.1 mg/dL (ref 8.6–10.2)
Chloride: 104 mmol/L (ref 96–106)
Creatinine, Ser: 1.13 mg/dL (ref 0.76–1.27)
GFR calc Af Amer: 68 mL/min/{1.73_m2} (ref >59)
GFR calc non Af Amer: 59 mL/min/{1.73_m2} — ABNORMAL LOW (ref >59)
Globulin, Total: 2.6 g/dL (ref 1.5–4.5)
Glucose: 119 mg/dL — ABNORMAL HIGH (ref 65–99)
Potassium: 4 mmol/L (ref 3.5–5.2)
Sodium: 142 mmol/L (ref 134–144)
Total Protein: 6.5 g/dL (ref 6.0–8.5)

## 2019-12-08 LAB — CBC WITH DIFFERENTIAL/PLATELET
Basophils Absolute: 0.1 10*3/uL (ref 0.0–0.2)
Basos: 1 %
EOS (ABSOLUTE): 0.2 10*3/uL (ref 0.0–0.4)
Eos: 3 %
Hematocrit: 40.5 % (ref 37.5–51.0)
Hemoglobin: 14 g/dL (ref 13.0–17.7)
Immature Grans (Abs): 0 10*3/uL (ref 0.0–0.1)
Immature Granulocytes: 0 %
Lymphocytes Absolute: 3.1 10*3/uL (ref 0.7–3.1)
Lymphs: 36 %
MCH: 31.1 pg (ref 26.6–33.0)
MCHC: 34.6 g/dL (ref 31.5–35.7)
MCV: 90 fL (ref 79–97)
Monocytes Absolute: 0.6 10*3/uL (ref 0.1–0.9)
Monocytes: 7 %
Neutrophils Absolute: 4.6 10*3/uL (ref 1.4–7.0)
Neutrophils: 53 %
Platelets: 190 10*3/uL (ref 150–450)
RBC: 4.5 x10E6/uL (ref 4.14–5.80)
RDW: 12.7 % (ref 11.6–15.4)
WBC: 8.6 10*3/uL (ref 3.4–10.8)

## 2019-12-08 LAB — URIC ACID: Uric Acid: 5.6 mg/dL (ref 3.8–8.4)

## 2019-12-08 LAB — TSH: TSH: 0.758 u[IU]/mL (ref 0.450–4.500)

## 2019-12-08 LAB — LIPID PANEL W/O CHOL/HDL RATIO
Cholesterol, Total: 131 mg/dL (ref 100–199)
HDL: 40 mg/dL (ref >39)
LDL Chol Calc (NIH): 67 mg/dL (ref 0–99)
Triglycerides: 138 mg/dL (ref 0–149)
VLDL Cholesterol Cal: 24 mg/dL (ref 5–40)

## 2019-12-10 ENCOUNTER — Encounter: Payer: Self-pay | Admitting: Family Medicine

## 2019-12-10 NOTE — Assessment & Plan Note (Signed)
Following with cardiology. Call with any concerns. Continue to monitor. Continue xarelto.

## 2019-12-10 NOTE — Assessment & Plan Note (Signed)
Under good control on current regimen. Continue current regimen. Continue to monitor. Call with any concerns. Refills given. Labs drawn today.   

## 2019-12-10 NOTE — Assessment & Plan Note (Signed)
Working with Cardiology. Just had tests done today. Continue to monitor. Call with any concerns.

## 2019-12-10 NOTE — Assessment & Plan Note (Addendum)
Under good control on current regimen. Continue current regimen. Continue to monitor. Call with any concerns. Refills given. Labs drawn today.   

## 2019-12-10 NOTE — Assessment & Plan Note (Signed)
Rechecking labs today. Await results. Call with any concerns.  

## 2020-02-04 ENCOUNTER — Telehealth: Payer: Self-pay | Admitting: Family Medicine

## 2020-02-04 ENCOUNTER — Ambulatory Visit (INDEPENDENT_AMBULATORY_CARE_PROVIDER_SITE_OTHER): Payer: Medicare Other

## 2020-02-04 ENCOUNTER — Other Ambulatory Visit: Payer: Self-pay

## 2020-02-04 VITALS — BP 132/64 | HR 61 | Temp 97.8°F | Resp 16 | Ht 70.0 in | Wt 168.1 lb

## 2020-02-04 DIAGNOSIS — Z0000 Encounter for general adult medical examination without abnormal findings: Secondary | ICD-10-CM

## 2020-02-04 NOTE — Telephone Encounter (Signed)
Can I call this into [facility] as a 90 day supply, looks like the prescription was printed when it was filled in January.

## 2020-02-04 NOTE — Patient Instructions (Signed)
Carlos Richard , Thank you for taking time to come for your Medicare Wellness Visit. I appreciate your ongoing commitment to your health goals. Please review the following plan we discussed and let me know if I can assist you in the future.   Screening recommendations/referrals: Colonoscopy: no longer required  Recommended yearly ophthalmology/optometry visit for glaucoma screening and checkup Recommended yearly dental visit for hygiene and checkup  Vaccinations: Influenza vaccine: up to date  Pneumococcal vaccine: up to date  Tdap vaccine: up to date  Shingles vaccine: up to date   Covid-19: up to date   Advanced directives: Advance directive discussed with you today. I have provided a copy for you to complete at home and have notarized. Once this is complete please bring a copy in to our office so we can scan it into your chart.  Conditions/risks identified: none   Next appointment: Follow up in one year for your annual wellness visit.   Preventive Care 84 Years and Older, Male Preventive care refers to lifestyle choices and visits with your health care provider that can promote health and wellness. What does preventive care include?  A yearly physical exam. This is also called an annual well check.  Dental exams once or twice a year.  Routine eye exams. Ask your health care provider how often you should have your eyes checked.  Personal lifestyle choices, including:  Daily care of your teeth and gums.  Regular physical activity.  Eating a healthy diet.  Avoiding tobacco and drug use.  Limiting alcohol use.  Practicing safe sex.  Taking low doses of aspirin every day.  Taking vitamin and mineral supplements as recommended by your health care provider. What happens during an annual well check? The services and screenings done by your health care provider during your annual well check will depend on your age, overall health, lifestyle risk factors, and family history of  disease. Counseling  Your health care provider may ask you questions about your:  Alcohol use.  Tobacco use.  Drug use.  Emotional well-being.  Home and relationship well-being.  Sexual activity.  Eating habits.  History of falls.  Memory and ability to understand (cognition).  Work and work Astronomer. Screening  You may have the following tests or measurements:  Height, weight, and BMI.  Blood pressure.  Lipid and cholesterol levels. These may be checked every 5 years, or more frequently if you are over 29 years old.  Skin check.  Lung cancer screening. You may have this screening every year starting at age 45 if you have a 30-pack-year history of smoking and currently smoke or have quit within the past 15 years.  Fecal occult blood test (FOBT) of the stool. You may have this test every year starting at age 64.  Flexible sigmoidoscopy or colonoscopy. You may have a sigmoidoscopy every 5 years or a colonoscopy every 10 years starting at age 15.  Prostate cancer screening. Recommendations will vary depending on your family history and other risks.  Hepatitis C blood test.  Hepatitis B blood test.  Sexually transmitted disease (STD) testing.  Diabetes screening. This is done by checking your blood sugar (glucose) after you have not eaten for a while (fasting). You may have this done every 1-3 years.  Abdominal aortic aneurysm (AAA) screening. You may need this if you are a current or former smoker.  Osteoporosis. You may be screened starting at age 66 if you are at high risk. Talk with your health care provider about  your test results, treatment options, and if necessary, the need for more tests. Vaccines  Your health care provider may recommend certain vaccines, such as:  Influenza vaccine. This is recommended every year.  Tetanus, diphtheria, and acellular pertussis (Tdap, Td) vaccine. You may need a Td booster every 10 years.  Zoster vaccine. You may  need this after age 20.  Pneumococcal 13-valent conjugate (PCV13) vaccine. One dose is recommended after age 76.  Pneumococcal polysaccharide (PPSV23) vaccine. One dose is recommended after age 75. Talk to your health care provider about which screenings and vaccines you need and how often you need them. This information is not intended to replace advice given to you by your health care provider. Make sure you discuss any questions you have with your health care provider. Document Released: 11/28/2015 Document Revised: 07/21/2016 Document Reviewed: 09/02/2015 Elsevier Interactive Patient Education  2017 [employer] Prevention in the Home Falls can cause injuries. They can happen to people of all ages. There are many things you can do to make your home safe and to help prevent falls. What can I do on the outside of my home?  Regularly fix the edges of walkways and driveways and fix any cracks.  Remove anything that might make you trip as you walk through a door, such as a raised step or threshold.  Trim any bushes or trees on the path to your home.  Use bright outdoor lighting.  Clear any walking paths of anything that might make someone trip, such as rocks or tools.  Regularly check to see if handrails are loose or broken. Make sure that both sides of any steps have handrails.  Any raised decks and porches should have guardrails on the edges.  Have any leaves, snow, or ice cleared regularly.  Use sand or salt on walking paths during winter.  Clean up any spills in your garage right away. This includes oil or grease spills. What can I do in the bathroom?  Use night lights.  Install grab bars by the toilet and in the tub and shower. Do not use towel bars as grab bars.  Use non-skid mats or decals in the tub or shower.  If you need to sit down in the shower, use a plastic, non-slip stool.  Keep the floor dry. Clean up any water that spills on the floor as soon as it  happens.  Remove soap buildup in the tub or shower regularly.  Attach bath mats securely with double-sided non-slip rug tape.  Do not have throw rugs and other things on the floor that can make you trip. What can I do in the bedroom?  Use night lights.  Make sure that you have a light by your bed that is easy to reach.  Do not use any sheets or blankets that are too big for your bed. They should not hang down onto the floor.  Have a firm chair that has side arms. You can use this for support while you get dressed.  Do not have throw rugs and other things on the floor that can make you trip. What can I do in the kitchen?  Clean up any spills right away.  Avoid walking on wet floors.  Keep items that you use a lot in easy-to-reach places.  If you need to reach something above you, use a strong step stool that has a grab bar.  Keep electrical cords out of the way.  Do not use floor polish or wax  that makes floors slippery. If you must use wax, use non-skid floor wax.  Do not have throw rugs and other things on the floor that can make you trip. What can I do with my stairs?  Do not leave any items on the stairs.  Make sure that there are handrails on both sides of the stairs and use them. Fix handrails that are broken or loose. Make sure that handrails are as long as the stairways.  Check any carpeting to make sure that it is firmly attached to the stairs. Fix any carpet that is loose or worn.  Avoid having throw rugs at the top or bottom of the stairs. If you do have throw rugs, attach them to the floor with carpet tape.  Make sure that you have a light switch at the top of the stairs and the bottom of the stairs. If you do not have them, ask someone to add them for you. What else can I do to help prevent falls?  Wear shoes that:  Do not have high heels.  Have rubber bottoms.  Are comfortable and fit you well.  Are closed at the toe. Do not wear sandals.  If you  use a stepladder:  Make sure that it is fully opened. Do not climb a closed stepladder.  Make sure that both sides of the stepladder are locked into place.  Ask someone to hold it for you, if possible.  Clearly mark and make sure that you can see:  Any grab bars or handrails.  First and last steps.  Where the edge of each step is.  Use tools that help you move around (mobility aids) if they are needed. These include:  Canes.  Walkers.  Scooters.  Crutches.  Turn on the lights when you go into a dark area. Replace any light bulbs as soon as they burn out.  Set up your furniture so you have a clear path. Avoid moving your furniture around.  If any of your floors are uneven, fix them.  If there are any pets around you, be aware of where they are.  Review your medicines with your doctor. Some medicines can make you feel dizzy. This can increase your chance of falling. Ask your doctor what other things that you can do to help prevent falls. This information is not intended to replace advice given to you by your health care provider. Make sure you discuss any questions you have with your health care provider. Document Released: 08/28/2009 Document Revised: 04/08/2016 Document Reviewed: 12/06/2014 Elsevier Interactive Patient Education  2017 [employer].

## 2020-02-04 NOTE — Progress Notes (Signed)
Subjective:   Carlos Richard is a 86 y.o. male who presents for Medicare Annual/Subsequent preventive examination.  Review of Systems:   Cardiac Risk Factors include: male gender;advanced age (>22men, >83 women)     Objective:    Vitals: BP 132/64 (BP Location: Left Arm, Patient Position: Sitting, Cuff Size: Normal)   Pulse 61   Temp 97.8 F (36.6 C) (Temporal)   Resp 16   Ht 5\' 10"  (1.778 m)   Wt 168 lb 1.6 oz (76.2 kg)   SpO2 97%   BMI 24.12 kg/m   Body mass index is 24.12 kg/m.  Advanced Directives 02/04/2020 10/31/2019 01/31/2019 07/03/2015 04/29/2015  Does Patient Have a Medical Advance Directive? No No No No No  Would patient like information on creating a medical advance directive? - No - Patient declined No - Patient declined No - patient declined information No - patient declined information    Tobacco Social History   Tobacco Use  Smoking Status Former Smoker  . Types: Cigarettes  . Quit date: 46  . Years since quitting: 41.2  Smokeless Tobacco Never Used  Tobacco Comment   quit 40 years ago      Counseling given: Not Answered Comment: quit 40 years ago    Clinical Intake:  Pre-visit preparation completed: Yes  Pain : No/denies pain     Nutritional Status: BMI of 19-24  Normal Nutritional Risks: None Diabetes: No  How often do you need to have someone help you when you read instructions, pamphlets, or other written materials from your doctor or pharmacy?: 1 - Never  Interpreter Needed?: No  Information entered by :: Shervon Kerwin,LPN  Past Medical History:  Diagnosis Date  . Atrial fibrillation (HCC)   . COPD (chronic obstructive pulmonary disease) (HCC)   . Hypertension   . Near syncope   . Tremors of nervous system    Past Surgical History:  Procedure Laterality Date  . BACK SURGERY    . BRAIN SURGERY     stimulator placement for tremors  . EYE SURGERY Left 2020   cataract surgery nov   . EYE SURGERY Right 2020   cataract  surgery dec   Family History  Problem Relation Age of Onset  . Tuberculosis Mother   . Tremor Paternal Grandmother   . Obesity Sister   . Prostate cancer Neg Hx   . Bladder Cancer Neg Hx   . Kidney cancer Neg Hx    Social History   Socioeconomic History  . Marital status: Married    Spouse name: Not on file  . Number of children: Not on file  . Years of education: Not on file  . Highest education level: Associate degree: academic program  Occupational History  . Not on file  Tobacco Use  . Smoking status: Former Smoker    Types: Cigarettes    Quit date: 1980    Years since quitting: 41.2  . Smokeless tobacco: Never Used  . Tobacco comment: quit 40 years ago   Substance and Sexual Activity  . Alcohol use: Yes    Comment: occasionally  . Drug use: No  . Sexual activity: Yes  Other Topics Concern  . Not on file  Social History Narrative  . Not on file   Social Determinants of Health   Financial Resource Strain:   . Difficulty of Paying Living Expenses:   Food Insecurity:   . Worried About 2021 in the Last Year:   . Programme researcher, broadcasting/film/video  of Food in the Last Year:   Transportation Needs:   . Freight forwarder (Medical):   Marland Kitchen Lack of Transportation (Non-Medical):   Physical Activity:   . Days of Exercise per Week:   . Minutes of Exercise per Session:   Stress:   . Feeling of Stress :   Social Connections:   . Frequency of Communication with Friends and Family:   . Frequency of Social Gatherings with Friends and Family:   . Attends Religious Services:   . Active Member of Clubs or Organizations:   . Attends Banker Meetings:   Marland Kitchen Marital Status:     Outpatient Encounter Medications as of 02/04/2020  Medication Sig  . allopurinol (ZYLOPRIM) 100 MG tablet Take 1 tablet (100 mg total) by mouth 3 (three) times a week. Monday, Wednesday, Friday  . finasteride (PROSCAR) 5 MG tablet Take 1 tablet (5 mg total) by mouth daily.  .  Fluticasone-Salmeterol (ADVAIR) 250-50 MCG/DOSE AEPB Inhale 1 puff into the lungs 2 (two) times daily.  . furosemide (LASIX) 20 MG tablet Take 20 mg by mouth 3 (three) times a week. Monday, Wednesday, Friday  . losartan (COZAAR) 50 MG tablet Take 1 tablet (50 mg total) by mouth daily.  . magnesium oxide (MAG-OX) 400 MG tablet Take 400 mg by mouth daily.  . metoprolol tartrate (LOPRESSOR) 25 MG tablet Take 25 mg by mouth at bedtime.  . Multiple Vitamins-Minerals (ZINC PO) Take 1 tablet by mouth daily.  . simvastatin (ZOCOR) 20 MG tablet Take 1 tablet (20 mg total) by mouth daily.  Marland Kitchen terazosin (HYTRIN) 5 MG capsule Take 1 capsule (5 mg total) by mouth at bedtime.  Marland Kitchen UNABLE TO FIND CuraMed  . vitamin E 200 UNIT capsule Take 200 Units by mouth daily.  [name] 15 MG TABS tablet Take 15 mg by mouth daily with supper.   . Zinc 50 MG TABS Take 50 mg by mouth daily.    No facility-administered encounter medications on file as of 02/04/2020.    Activities of Daily Living In your present state of health, do you have any difficulty performing the following activities: 02/04/2020  Hearing? Y  Comment no hearing aids  Vision? Y  Comment reading glasses, Cloyde Reams.  Difficulty concentrating or making decisions? N  Walking or climbing stairs? N  Dressing or bathing? N  Doing errands, shopping? N  Preparing Food and eating ? N  Using the Toilet? N  In the past six months, have you accidently leaked urine? N  Do you have problems with loss of bowel control? N  Managing your Medications? N  Managing your Finances? N  Housekeeping or managing your Housekeeping? N  Some recent data might be hidden    Patient Care Team: Dorcas Carrow, DO as PCP - General (Family Medicine)   Assessment:   This is a routine wellness examination for [facility].  Exercise Activities and Dietary recommendations Current Exercise Habits: Home exercise routine, Type of exercise: walking, Time (Minutes): 20, Frequency  (Times/Week): 3, Weekly Exercise (Minutes/Week): 60, Intensity: Mild, Exercise limited by: None identified  Goals Addressed   None     Fall Risk: Fall Risk  02/04/2020 02/13/2019 01/31/2019 08/03/2018  Falls in the past year? 0 0 0 No  Number falls in past yr: 0 - - -  Injury with Fall? 0 - - -  Follow up - Falls evaluation completed - -    FALL RISK PREVENTION PERTAINING TO THE HOME:  Any stairs  in or around the home? Yes  If so, are there any without handrails? No   Home free of loose throw rugs in walkways, pet beds, electrical cords, etc? Yes  Adequate lighting in your home to reduce risk of falls? Yes   ASSISTIVE DEVICES UTILIZED TO PREVENT FALLS:  Life alert? No  Use of a cane, walker or w/c? No  Grab bars in the bathroom? No  Shower chair or bench in shower? No  Elevated toilet seat or a handicapped toilet? No   TIMED UP AND GO:  Was the test performed? Yes .  Length of time to ambulate 10 feet: 9 sec.   GAIT:  Appearance of gait: Gait steady and fast without the use of an assistive device.  Education: Fall risk prevention has been discussed.  Intervention(s) required? No  DME/home health order needed?  No   Depression Screen PHQ 2/9 Scores 02/04/2020 01/31/2019 08/03/2018  PHQ - 2 Score 0 0 0    Cognitive Function     6CIT Screen 02/04/2020 01/31/2019  What Year? 0 points 0 points  What month? 0 points 0 points  What time? 0 points 0 points  Count back from 20 0 points 0 points  Months in reverse 0 points 0 points  Repeat phrase 0 points 0 points  Total Score 0 0    Immunization History  Administered Date(s) Administered  . Influenza-Unspecified 08/29/2018, 08/16/2019  . PFIZER SARS-COV-2 Vaccination 12/21/2019, 01/11/2020  . Pneumococcal Conjugate-13 08/14/2019  . Td 04/29/2015  . Zoster Recombinat (Shingrix) 05/27/2018, 08/25/2018    Qualifies for Shingles Vaccine? Yes  shingrix completed   Tdap: up to date   Flu Vaccine: up to date    Pneumococcal Vaccine: up to date   Covid-19 Vaccine: Completed vaccines  Screening Tests Health Maintenance  Topic Date Due  . PNA vac Low Risk Adult (2 of 2 - PPSV23) 08/13/2020  . TETANUS/TDAP  04/28/2025  . INFLUENZA VACCINE  Completed   Cancer Screenings:  Colorectal Screening: no longer required   Lung Cancer Screening: (Low Dose CT Chest recommended if Age 59-80 years, 30 pack-year currently smoking OR have quit w/in 15years.) does not qualify.    Additional Screening:  Hepatitis C Screening: does not qualify  Vision Screening: Recommended annual ophthalmology exams for early detection of glaucoma and other disorders of the eye. Is the patient up to date with their annual eye exam?  Yes  Who is the provider or what is the name of the office in which the pt attends annual eye exams? [facility]    Dental Screening: Recommended annual dental exams for proper oral hygiene  Community Resource Referral:  CRR required this visit?  No        Plan:  I have personally reviewed and addressed the Medicare Annual Wellness questionnaire and have noted the following in the patient's chart:  A. Medical and social history B. Use of alcohol, tobacco or illicit drugs  C. Current medications and supplements D. Functional ability and status E.  Nutritional status F.  Physical activity G. Advance directives H. List of other physicians I.  Hospitalizations, surgeries, and ER visits in previous 12 months J.  Vitals K. Screenings such as hearing and vision if needed, cognitive and depression L. Referrals and appointments   In addition, I have reviewed and discussed with patient certain preventive protocols, quality metrics, and best practice recommendations. A written personalized care plan for preventive services as well as general preventive health recommendations were provided to  patient.   Signed,   Bevelyn Ngo, LPN  07/13/5620 Nurse Health Advisor   Nurse Notes: patient  states right foot dragging when ambulating in the last year. No other complaints, denies any pain. appt 03/06/2020.

## 2020-02-04 NOTE — Telephone Encounter (Signed)
Prescription updated at [facility]

## 2020-02-04 NOTE — Telephone Encounter (Signed)
Pt. stated he needs a refill of fluticasone salmeterol for 3 month sent to [facility] court.

## 2020-02-04 NOTE — Telephone Encounter (Signed)
Absolutely.

## 2020-03-06 ENCOUNTER — Ambulatory Visit (INDEPENDENT_AMBULATORY_CARE_PROVIDER_SITE_OTHER): Payer: Medicare Other | Admitting: Family Medicine

## 2020-03-06 ENCOUNTER — Other Ambulatory Visit: Payer: Self-pay

## 2020-03-06 ENCOUNTER — Encounter: Payer: Self-pay | Admitting: Family Medicine

## 2020-03-06 VITALS — BP 144/82 | HR 72 | Temp 97.8°F | Wt 170.0 lb

## 2020-03-06 DIAGNOSIS — I4891 Unspecified atrial fibrillation: Secondary | ICD-10-CM

## 2020-03-06 DIAGNOSIS — E782 Mixed hyperlipidemia: Secondary | ICD-10-CM

## 2020-03-06 DIAGNOSIS — I129 Hypertensive chronic kidney disease with stage 1 through stage 4 chronic kidney disease, or unspecified chronic kidney disease: Secondary | ICD-10-CM | POA: Diagnosis not present

## 2020-03-06 NOTE — Progress Notes (Signed)
BP (!) 144/82   Pulse 72   Temp 97.8 F (36.6 C) (Oral)   Wt 170 lb (77.1 kg)   SpO2 92%   BMI 24.39 kg/m    Subjective:    Patient ID: Carlos Richard, male    DOB: 08/10/1932, 86 y.o.   MRN: [id number]  HPI: Carlos Richard is a 86 y.o. male  Chief Complaint  Patient presents with  . Hypertension   HYPERTENSION / [place] Satisfied with current treatment? yes Duration of hypertension: chronic BP monitoring frequency: not checking BP medication side effects: no Duration of hyperlipidemia: chronic Cholesterol medication side effects: no Cholesterol supplements: none Past cholesterol medications: simvastatin Medication compliance: excellent compliance Aspirin: no Recent stressors: no Recurrent headaches: no Visual changes: no Palpitations: no Dyspnea: yes Chest pain: no Lower extremity edema: no Dizzy/lightheaded: no  Relevant past medical, surgical, family and social history reviewed and updated as indicated. Interim medical history since our last visit reviewed. Allergies and medications reviewed and updated.  Review of Systems  Constitutional: Negative.   Respiratory: Negative.   Cardiovascular: Negative.   Gastrointestinal: Negative.   Musculoskeletal: Negative.   Psychiatric/Behavioral: Negative.     Per HPI unless specifically indicated above     Objective:    BP (!) 144/82   Pulse 72   Temp 97.8 F (36.6 C) (Oral)   Wt 170 lb (77.1 kg)   SpO2 92%   BMI 24.39 kg/m   Wt Readings from Last 3 Encounters:  03/06/20 170 lb (77.1 kg)  02/04/20 168 lb 1.6 oz (76.2 kg)  12/05/19 165 lb (74.8 kg)    Physical Exam Vitals and nursing note reviewed.  Constitutional:      General: He is not in acute distress.    Appearance: Normal appearance. He is not ill-appearing, toxic-appearing or diaphoretic.  HENT:     Head: Normocephalic and atraumatic.     Right Ear: External ear normal.     Left Ear: External ear normal.     Nose: Nose normal.      Mouth/Throat:     Mouth: Mucous membranes are moist.     Pharynx: Oropharynx is clear.  Eyes:     General: No scleral icterus.       Right eye: No discharge.        Left eye: No discharge.     Extraocular Movements: Extraocular movements intact.     Conjunctiva/sclera: Conjunctivae normal.     Pupils: Pupils are equal, round, and reactive to light.  Cardiovascular:     Rate and Rhythm: Normal rate and regular rhythm.     Pulses: Normal pulses.     Heart sounds: Normal heart sounds. No murmur. No friction rub. No gallop.   Pulmonary:     Effort: Pulmonary effort is normal. No respiratory distress.     Breath sounds: Normal breath sounds. No stridor. No wheezing, rhonchi or rales.  Chest:     Chest wall: No tenderness.  Musculoskeletal:        General: Normal range of motion.     Cervical back: Normal range of motion and neck supple.  Skin:    General: Skin is warm and dry.     Capillary Refill: Capillary refill takes less than 2 seconds.     Coloration: Skin is not jaundiced or pale.     Findings: No bruising, erythema, lesion or rash.  Neurological:     General: No focal deficit present.     Mental Status: He  is alert and oriented to person, place, and time. Mental status is at baseline.  Psychiatric:        Mood and Affect: Mood normal.        Behavior: Behavior normal.        Thought Content: Thought content normal.        Judgment: Judgment normal.     Results for orders placed or performed in visit on 12/07/19  Microscopic Examination   BLD  Result Value Ref Range   WBC, UA None seen 0 - 5 /hpf   RBC 0-2 0 - 2 /hpf   Epithelial Cells (non renal) 0-10 0 - 10 /hpf   Bacteria, UA None seen None seen/Few  Bayer DCA Hb A1c Waived  Result Value Ref Range   HB A1C (BAYER DCA - WAIVED) 5.7 <7.0 %  Uric acid  Result Value Ref Range   Uric Acid 5.6 3.8 - 8.4 mg/dL  UA/M w/rflx Culture, Routine   Specimen: Blood   BLD  Result Value Ref Range   Specific Gravity,  UA <1.005 (L) 1.005 - 1.030   pH, UA 5.5 5.0 - 7.5   Color, UA Yellow Yellow   Appearance Ur Clear Clear   Leukocytes,UA Negative Negative   Protein,UA Trace (A) Negative/Trace   Glucose, UA Negative Negative   Ketones, UA Negative Negative   RBC, UA Trace (A) Negative   Bilirubin, UA Negative Negative   Urobilinogen, Ur 0.2 0.2 - 1.0 mg/dL   Nitrite, UA Negative Negative   Microscopic Examination See below:   TSH  Result Value Ref Range   TSH 0.758 0.450 - 4.500 uIU/mL  Microalbumin, Urine Waived  Result Value Ref Range   Microalb, Ur Waived 303 (H) 0 - 19 mg/L   Creatinine, Urine Waived 50 10 - 300 mg/dL   Microalb/Creat Ratio 30-300 (H) <30 mg/g  Lipid Panel w/o Chol/HDL Ratio  Result Value Ref Range   Cholesterol, Total 131 100 - 199 mg/dL   Triglycerides 409 0 - 149 mg/dL   HDL 40 >81 mg/dL   VLDL Cholesterol Cal 24 5 - 40 mg/dL   LDL Chol Calc (NIH) 67 0 - 99 mg/dL  Comprehensive metabolic panel  Result Value Ref Range   Glucose 119 (H) 65 - 99 mg/dL   BUN 13 8 - 27 mg/dL   Creatinine, Ser 1.91 0.76 - 1.27 mg/dL   GFR calc non Af Amer 59 (L) >59 mL/min/1.73   GFR calc Af Amer 68 >59 mL/min/1.73   BUN/Creatinine Ratio 12 10 - 24   Sodium 142 134 - 144 mmol/L   Potassium 4.0 3.5 - 5.2 mmol/L   Chloride 104 96 - 106 mmol/L   CO2 27 20 - 29 mmol/L   Calcium 9.1 8.6 - 10.2 mg/dL   Total Protein 6.5 6.0 - 8.5 g/dL   Albumin 3.9 3.6 - 4.6 g/dL   Globulin, Total 2.6 1.5 - 4.5 g/dL   Albumin/Globulin Ratio 1.5 1.2 - 2.2   Bilirubin Total 0.8 0.0 - 1.2 mg/dL   Alkaline Phosphatase 56 39 - 117 IU/L   AST 26 0 - 40 IU/L   ALT 19 0 - 44 IU/L  CBC with Differential/Platelet  Result Value Ref Range   WBC 8.6 3.4 - 10.8 x10E3/uL   RBC 4.50 4.14 - 5.80 x10E6/uL   Hemoglobin 14.0 13.0 - 17.7 g/dL   Hematocrit 47.8 29.5 - 51.0 %   MCV 90 79 - 97 fL   MCH 31.1 26.6 -  33.0 pg   MCHC 34.6 31.5 - 35.7 g/dL   RDW 65.9 93.5 - 70.1 %   Platelets 190 150 - 450 x10E3/uL    Neutrophils 53 Not Estab. %   Lymphs 36 Not Estab. %   Monocytes 7 Not Estab. %   Eos 3 Not Estab. %   Basos 1 Not Estab. %   Neutrophils Absolute 4.6 1.4 - 7.0 x10E3/uL   Lymphocytes Absolute 3.1 0.7 - 3.1 x10E3/uL   Monocytes Absolute 0.6 0.1 - 0.9 x10E3/uL   EOS (ABSOLUTE) 0.2 0.0 - 0.4 x10E3/uL   Basophils Absolute 0.1 0.0 - 0.2 x10E3/uL   Immature Granulocytes 0 Not Estab. %   Immature Grans (Abs) 0.0 0.0 - 0.1 x10E3/uL      Assessment & Plan:   Problem List Items Addressed This Visit      Cardiovascular and Mediastinum   Atrial fibrillation (HCC)    NSR today. Feeling well. Checking CBC. Continue current regimen. Continue to monitor.       Relevant Orders   CBC with Differential/Platelet   Comprehensive metabolic panel     Genitourinary   Benign hypertensive renal disease - Primary    Under good control on current regimen. Continue current regimen. Continue to monitor. Call with any concerns. Refills given. Labs drawn today.       Relevant Orders   Comprehensive metabolic panel     Other   Mixed hyperlipidemia    Under good control on current regimen. Continue current regimen. Continue to monitor. Call with any concerns. Refills given. Labs drawn today.       Relevant Orders   Lipid Panel w/o Chol/HDL Ratio   Comprehensive metabolic panel       Follow up plan: Return in about 3 months (around 06/05/2020).

## 2020-03-06 NOTE — Assessment & Plan Note (Signed)
NSR today. Feeling well. Checking CBC. Continue current regimen. Continue to monitor.

## 2020-03-06 NOTE — Assessment & Plan Note (Signed)
Under good control on current regimen. Continue current regimen. Continue to monitor. Call with any concerns. Refills given. Labs drawn today.   

## 2020-03-07 LAB — LIPID PANEL W/O CHOL/HDL RATIO
Cholesterol, Total: 132 mg/dL (ref 100–199)
HDL: 43 mg/dL (ref >39)
LDL Chol Calc (NIH): 69 mg/dL (ref 0–99)
Triglycerides: 109 mg/dL (ref 0–149)
VLDL Cholesterol Cal: 20 mg/dL (ref 5–40)

## 2020-03-07 LAB — CBC WITH DIFFERENTIAL/PLATELET
Basophils Absolute: 0.1 10*3/uL (ref 0.0–0.2)
Basos: 1 %
EOS (ABSOLUTE): 0.2 10*3/uL (ref 0.0–0.4)
Eos: 4 %
Hematocrit: 41.7 % (ref 37.5–51.0)
Hemoglobin: 14 g/dL (ref 13.0–17.7)
Immature Grans (Abs): 0 10*3/uL (ref 0.0–0.1)
Immature Granulocytes: 0 %
Lymphocytes Absolute: 2.1 10*3/uL (ref 0.7–3.1)
Lymphs: 34 %
MCH: 31.3 pg (ref 26.6–33.0)
MCHC: 33.6 g/dL (ref 31.5–35.7)
MCV: 93 fL (ref 79–97)
Monocytes Absolute: 0.6 10*3/uL (ref 0.1–0.9)
Monocytes: 9 %
Neutrophils Absolute: 3.2 10*3/uL (ref 1.4–7.0)
Neutrophils: 52 %
Platelets: 187 10*3/uL (ref 150–450)
RBC: 4.48 x10E6/uL (ref 4.14–5.80)
RDW: 13.1 % (ref 11.6–15.4)
WBC: 6.2 10*3/uL (ref 3.4–10.8)

## 2020-03-07 LAB — COMPREHENSIVE METABOLIC PANEL
ALT: 19 IU/L (ref 0–44)
AST: 29 IU/L (ref 0–40)
Albumin/Globulin Ratio: 1.9 (ref 1.2–2.2)
Albumin: 4.1 g/dL (ref 3.6–4.6)
Alkaline Phosphatase: 56 IU/L (ref 39–117)
BUN/Creatinine Ratio: 7 — ABNORMAL LOW (ref 10–24)
BUN: 9 mg/dL (ref 8–27)
Bilirubin Total: 0.5 mg/dL (ref 0.0–1.2)
CO2: 29 mmol/L (ref 20–29)
Calcium: 8.9 mg/dL (ref 8.6–10.2)
Chloride: 104 mmol/L (ref 96–106)
Creatinine, Ser: 1.21 mg/dL (ref 0.76–1.27)
GFR calc Af Amer: 62 mL/min/{1.73_m2} (ref >59)
GFR calc non Af Amer: 54 mL/min/{1.73_m2} — ABNORMAL LOW (ref >59)
Globulin, Total: 2.2 g/dL (ref 1.5–4.5)
Glucose: 94 mg/dL (ref 65–99)
Potassium: 4.1 mmol/L (ref 3.5–5.2)
Sodium: 144 mmol/L (ref 134–144)
Total Protein: 6.3 g/dL (ref 6.0–8.5)

## 2020-04-22 ENCOUNTER — Emergency Department
Admission: EM | Admit: 2020-04-22 | Discharge: 2020-04-22 | Discharge status: Against Medical Advice | Payer: Medicare Other | Attending: Emergency Medicine | Admitting: Emergency Medicine

## 2020-04-22 ENCOUNTER — Other Ambulatory Visit: Payer: Self-pay

## 2020-04-22 ENCOUNTER — Emergency Department: Payer: Medicare Other

## 2020-04-22 DIAGNOSIS — R55 Syncope and collapse: Secondary | ICD-10-CM | POA: Diagnosis present

## 2020-04-22 DIAGNOSIS — Z5321 Procedure and treatment not carried out due to patient leaving prior to being seen by health care provider: Secondary | ICD-10-CM | POA: Insufficient documentation

## 2020-04-22 LAB — CBC
HCT: 40.6 % (ref 39.0–52.0)
Hemoglobin: 13.5 g/dL (ref 13.0–17.0)
MCH: 30.7 pg (ref 26.0–34.0)
MCHC: 33.3 g/dL (ref 30.0–36.0)
MCV: 92.3 fL (ref 80.0–100.0)
Platelets: 183 10*3/uL (ref 150–400)
RBC: 4.4 MIL/uL (ref 4.22–5.81)
RDW: 13.2 % (ref 11.5–15.5)
WBC: 9.3 10*3/uL (ref 4.0–10.5)
nRBC: 0 % (ref 0.0–0.2)

## 2020-04-22 LAB — BASIC METABOLIC PANEL
Anion gap: 8 (ref 5–15)
BUN: 15 mg/dL (ref 8–23)
CO2: 28 mmol/L (ref 22–32)
Calcium: 8.6 mg/dL — ABNORMAL LOW (ref 8.9–10.3)
Chloride: 105 mmol/L (ref 98–111)
Creatinine, Ser: 1.34 mg/dL — ABNORMAL HIGH (ref 0.61–1.24)
GFR calc Af Amer: 55 mL/min — ABNORMAL LOW (ref >60)
GFR calc non Af Amer: 48 mL/min — ABNORMAL LOW (ref >60)
Glucose, Bld: 135 mg/dL — ABNORMAL HIGH (ref 70–99)
Potassium: 3.5 mmol/L (ref 3.5–5.1)
Sodium: 141 mmol/L (ref 135–145)

## 2020-04-22 LAB — TROPONIN I (HIGH SENSITIVITY): Troponin I (High Sensitivity): 7 ng/L (ref <18)

## 2020-04-22 NOTE — ED Triage Notes (Signed)
EMS brought pt in from home for near-syncope while sitting in chair; hx HTN, Afib; no interventions done PTA

## 2020-04-22 NOTE — ED Triage Notes (Signed)
Pt arrives to ED via [facility] from home with c/o near-syncope that happened just PTA. Pt states he "has no idea what happened or who called EMS". Pt denies any c/o CP or SHOB (beyond his normal baseline of COPD). Pt denies HA; no numbness or weakness. No c/o any pain. Pt is A&O, in NAD; RR even, regular, and unlabored.

## 2020-05-28 ENCOUNTER — Other Ambulatory Visit: Payer: Self-pay | Admitting: Family Medicine

## 2020-09-03 ENCOUNTER — Other Ambulatory Visit: Payer: Self-pay | Admitting: Family Medicine

## 2020-09-16 ENCOUNTER — Encounter: Payer: Self-pay | Admitting: Family Medicine

## 2020-09-16 ENCOUNTER — Other Ambulatory Visit: Payer: Self-pay

## 2020-09-16 ENCOUNTER — Ambulatory Visit: Payer: Medicare Other | Admitting: Family Medicine

## 2020-09-16 VITALS — BP 164/74 | HR 61 | Temp 98.3°F | Ht 70.0 in | Wt 164.0 lb

## 2020-09-16 DIAGNOSIS — E782 Mixed hyperlipidemia: Secondary | ICD-10-CM

## 2020-09-16 DIAGNOSIS — J439 Emphysema, unspecified: Secondary | ICD-10-CM | POA: Diagnosis not present

## 2020-09-16 DIAGNOSIS — N401 Enlarged prostate with lower urinary tract symptoms: Secondary | ICD-10-CM | POA: Diagnosis not present

## 2020-09-16 DIAGNOSIS — I129 Hypertensive chronic kidney disease with stage 1 through stage 4 chronic kidney disease, or unspecified chronic kidney disease: Secondary | ICD-10-CM | POA: Diagnosis not present

## 2020-09-16 DIAGNOSIS — R2689 Other abnormalities of gait and mobility: Secondary | ICD-10-CM

## 2020-09-16 DIAGNOSIS — N138 Other obstructive and reflux uropathy: Secondary | ICD-10-CM

## 2020-09-16 DIAGNOSIS — N1831 Chronic kidney disease, stage 3a: Secondary | ICD-10-CM

## 2020-09-16 DIAGNOSIS — M1A9XX1 Chronic gout, unspecified, with tophus (tophi): Secondary | ICD-10-CM

## 2020-09-16 MED ORDER — FINASTERIDE 5 MG PO TABS: 5.0000 mg | ORAL_TABLET | Freq: Every day | ORAL | 0 refills | Status: DC

## 2020-09-16 MED ORDER — SIMVASTATIN 20 MG PO TABS: 20.0000 mg | ORAL_TABLET | Freq: Every day | ORAL | 1 refills | Status: DC

## 2020-09-16 MED ORDER — ALLOPURINOL 100 MG PO TABS: 100.0000 mg | ORAL_TABLET | ORAL | 1 refills | Status: DC

## 2020-09-16 MED ORDER — TERAZOSIN HCL 5 MG PO CAPS: 5.0000 mg | ORAL_CAPSULE | Freq: Every day | ORAL | 1 refills | Status: DC

## 2020-09-16 NOTE — Assessment & Plan Note (Signed)
Has been off is losartan. Restart it and follow up with Dr. Welton Flakes as needed. Call with any concerns. Labs drawn today.

## 2020-09-16 NOTE — Assessment & Plan Note (Signed)
Under good control on current regimen. Continue current regimen. Continue to monitor. Call with any concerns. Refills given. Labs drawn today.   

## 2020-09-16 NOTE — Assessment & Plan Note (Signed)
Rechecking labs today. Await results. Treat as needed.  °

## 2020-09-16 NOTE — Progress Notes (Signed)
BP (!) 164/74 (BP Location: Left Arm, Patient Position: Sitting)   Pulse 61   Temp 98.3 F (36.8 C) (Oral)   Ht 5\' 10"  (1.778 m)   Wt 164 lb (74.4 kg)   SpO2 96%   BMI 23.53 kg/m    Subjective:    Patient ID: , male    DOB: 08-09-1932, 86 y.o.   MRN: [id number]  HPI: Carlos Richard is a 86 y.o. male  Chief Complaint  Patient presents with  . Medication Refill  . walking problems    X2- 3 weeks, off balance   Has been feeling off balance for about 2-3 weeks. Saw Dr. 88 about 2-3 weeks ago and stopped his losartan by mistake. Saw him this AM and restarting on it today  HYPERTENSION / HYPERLIPIDEMIA Satisfied with current treatment? no Duration of hypertension: chronic BP monitoring frequency: a few times a week BP medication side effects: no Duration of hyperlipidemia: chronic Cholesterol medication side effects: no Cholesterol supplements: none Past cholesterol medications: simvastatin Medication compliance: excellent compliance Aspirin: no Recent stressors: no Recurrent headaches: no Visual changes: no Palpitations: no Dyspnea: no Chest pain: no Lower extremity edema: no Dizzy/lightheaded: no  BPH BPH status: controlled Satisfied with current treatment?: yes Medication side effects: no Medication compliance: excellent compliance Duration: chronic Nocturia: 1-2x per night Urinary frequency:no Incomplete voiding: no Urgency: no Weak urinary stream: no Straining to start stream: no Dysuria: no Onset: gradual Severity: moderate  Relevant past medical, surgical, family and social history reviewed and updated as indicated. Interim medical history since our last visit reviewed. Allergies and medications reviewed and updated.  Review of Systems  Constitutional: Negative.   Respiratory: Positive for shortness of breath. Negative for apnea, cough, choking, chest tightness, wheezing and stridor.   Cardiovascular: Positive for palpitations.  Negative for chest pain and leg swelling.  Gastrointestinal: Negative.   Musculoskeletal: Negative.   Neurological: Positive for dizziness, tremors and weakness. Negative for seizures, syncope, facial asymmetry, speech difficulty, light-headedness, numbness and headaches.  Psychiatric/Behavioral: Negative.     Per HPI unless specifically indicated above     Objective:    BP (!) 164/74 (BP Location: Left Arm, Patient Position: Sitting)   Pulse 61   Temp 98.3 F (36.8 C) (Oral)   Ht 5\' 10"  (1.778 m)   Wt 164 lb (74.4 kg)   SpO2 96%   BMI 23.53 kg/m   Wt Readings from Last 3 Encounters:  09/16/20 164 lb (74.4 kg)  04/22/20 161 lb (73 kg)  03/06/20 170 lb (77.1 kg)    Physical Exam Vitals and nursing note reviewed.  Constitutional:      General: He is not in acute distress.    Appearance: Normal appearance. He is not ill-appearing, toxic-appearing or diaphoretic.  HENT:     Head: Normocephalic and atraumatic.     Right Ear: External ear normal.     Left Ear: External ear normal.     Nose: Nose normal.     Mouth/Throat:     Mouth: Mucous membranes are moist.     Pharynx: Oropharynx is clear.  Eyes:     General: No scleral icterus.       Right eye: No discharge.        Left eye: No discharge.     Extraocular Movements: Extraocular movements intact.     Conjunctiva/sclera: Conjunctivae normal.     Pupils: Pupils are equal, round, and reactive to light.  Cardiovascular:     Rate  and Rhythm: Normal rate and regular rhythm.     Pulses: Normal pulses.     Heart sounds: Normal heart sounds. No murmur heard.  No friction rub. No gallop.   Pulmonary:     Effort: Pulmonary effort is normal. No respiratory distress.     Breath sounds: Normal breath sounds. No stridor. No wheezing, rhonchi or rales.  Chest:     Chest wall: No tenderness.  Musculoskeletal:        General: Normal range of motion.     Cervical back: Normal range of motion and neck supple.  Skin:    General:  Skin is warm and dry.     Capillary Refill: Capillary refill takes less than 2 seconds.     Coloration: Skin is not jaundiced or pale.     Findings: No bruising, erythema, lesion or rash.  Neurological:     General: No focal deficit present.     Mental Status: He is alert and oriented to person, place, and time. Mental status is at baseline.  Psychiatric:        Mood and Affect: Mood normal.        Behavior: Behavior normal.        Thought Content: Thought content normal.        Judgment: Judgment normal.     Results for orders placed or performed during the hospital encounter of 04/22/20  Basic metabolic panel  Result Value Ref Range   Sodium 141 135 - 145 mmol/L   Potassium 3.5 3.5 - 5.1 mmol/L   Chloride 105 98 - 111 mmol/L   CO2 28 22 - 32 mmol/L   Glucose, Bld 135 (H) 70 - 99 mg/dL   BUN 15 8 - 23 mg/dL   Creatinine, Ser 3.76 (H) 0.61 - 1.24 mg/dL   Calcium 8.6 (L) 8.9 - 10.3 mg/dL   GFR calc non Af Amer 48 (L) >60 mL/min   GFR calc Af Amer 55 (L) >60 mL/min   Anion gap 8 5 - 15  CBC  Result Value Ref Range   WBC 9.3 4.0 - 10.5 K/uL   RBC 4.40 4.22 - 5.81 MIL/uL   Hemoglobin 13.5 13.0 - 17.0 g/dL   HCT 28.3 39 - 52 %   MCV 92.3 80.0 - 100.0 fL   MCH 30.7 26.0 - 34.0 pg   MCHC 33.3 30.0 - 36.0 g/dL   RDW 15.1 76.1 - 60.7 %   Platelets 183 150 - 400 K/uL   nRBC 0.0 0.0 - 0.2 %  Troponin I (High Sensitivity)  Result Value Ref Range   Troponin I (High Sensitivity) 7 <18 ng/L      Assessment & Plan:   Problem List Items Addressed This Visit      Respiratory   COPD (chronic obstructive pulmonary disease) (HCC)    Under good control on current regimen. Continue current regimen. Continue to monitor. Call with any concerns. Refills given. Labs drawn today.        Relevant Orders   Comprehensive metabolic panel   CBC with Differential/Platelet     Genitourinary   BPH with obstruction/lower urinary tract symptoms    Under good control on current regimen.  Continue current regimen. Continue to monitor. Call with any concerns. Refills given. Labs drawn today.        Relevant Medications   finasteride (PROSCAR) 5 MG tablet   Other Relevant Orders   Comprehensive metabolic panel   CBC with Differential/Platelet   Benign hypertensive  renal disease    Has been off is losartan. Restart it and follow up with Dr. Welton Flakes as needed. Call with any concerns. Labs drawn today.       Relevant Orders   Comprehensive metabolic panel   CBC with Differential/Platelet   CKD (chronic kidney disease) stage 3, GFR 30-59 ml/min    Rechecking labs today. Await results. Treat as needed.       Relevant Orders   Comprehensive metabolic panel   CBC with Differential/Platelet     Other   Gout    Under good control on current regimen. Continue current regimen. Continue to monitor. Call with any concerns. Refills given. Labs drawn today.       Relevant Orders   Uric acid   Comprehensive metabolic panel   CBC with Differential/Platelet   Mixed hyperlipidemia    Under good control on current regimen. Continue current regimen. Continue to monitor. Call with any concerns. Refills given. Labs drawn today.       Relevant Medications   terazosin (HYTRIN) 5 MG capsule   simvastatin (ZOCOR) 20 MG tablet   Other Relevant Orders   Lipid Panel w/o Chol/HDL Ratio   Comprehensive metabolic panel   CBC with Differential/Platelet    Other Visit Diagnoses    Balance problem    -  Primary   Will get him PT to help with balance. Continue to follow with neurology as needed. Call with any concerns.    Relevant Orders   Ambulatory referral to Home Health       Follow up plan: Return in about 2 months (around 11/16/2020) for follow up balance.

## 2020-09-17 LAB — CBC WITH DIFFERENTIAL/PLATELET
Basophils Absolute: 0.1 10*3/uL (ref 0.0–0.2)
Basos: 1 %
EOS (ABSOLUTE): 0.3 10*3/uL (ref 0.0–0.4)
Eos: 4 %
Hematocrit: 41.5 % (ref 37.5–51.0)
Hemoglobin: 13.7 g/dL (ref 13.0–17.7)
Immature Grans (Abs): 0 10*3/uL (ref 0.0–0.1)
Immature Granulocytes: 0 %
Lymphocytes Absolute: 2.9 10*3/uL (ref 0.7–3.1)
Lymphs: 40 %
MCH: 30.4 pg (ref 26.6–33.0)
MCHC: 33 g/dL (ref 31.5–35.7)
MCV: 92 fL (ref 79–97)
Monocytes Absolute: 0.6 10*3/uL (ref 0.1–0.9)
Monocytes: 8 %
Neutrophils Absolute: 3.6 10*3/uL (ref 1.4–7.0)
Neutrophils: 47 %
Platelets: 191 10*3/uL (ref 150–450)
RBC: 4.5 x10E6/uL (ref 4.14–5.80)
RDW: 12.5 % (ref 11.6–15.4)
WBC: 7.4 10*3/uL (ref 3.4–10.8)

## 2020-09-17 LAB — COMPREHENSIVE METABOLIC PANEL
ALT: 19 IU/L (ref 0–44)
AST: 27 IU/L (ref 0–40)
Albumin/Globulin Ratio: 2 (ref 1.2–2.2)
Albumin: 4.2 g/dL (ref 3.6–4.6)
Alkaline Phosphatase: 65 IU/L (ref 44–121)
BUN/Creatinine Ratio: 7 — ABNORMAL LOW (ref 10–24)
BUN: 9 mg/dL (ref 8–27)
Bilirubin Total: 0.5 mg/dL (ref 0.0–1.2)
CO2: 27 mmol/L (ref 20–29)
Calcium: 9.2 mg/dL (ref 8.6–10.2)
Chloride: 106 mmol/L (ref 96–106)
Creatinine, Ser: 1.23 mg/dL (ref 0.76–1.27)
GFR calc Af Amer: 61 mL/min/{1.73_m2} (ref >59)
GFR calc non Af Amer: 53 mL/min/{1.73_m2} — ABNORMAL LOW (ref >59)
Globulin, Total: 2.1 g/dL (ref 1.5–4.5)
Glucose: 86 mg/dL (ref 65–99)
Potassium: 4.2 mmol/L (ref 3.5–5.2)
Sodium: 144 mmol/L (ref 134–144)
Total Protein: 6.3 g/dL (ref 6.0–8.5)

## 2020-09-17 LAB — LIPID PANEL W/O CHOL/HDL RATIO
Cholesterol, Total: 132 mg/dL (ref 100–199)
HDL: 45 mg/dL (ref >39)
LDL Chol Calc (NIH): 67 mg/dL (ref 0–99)
Triglycerides: 109 mg/dL (ref 0–149)
VLDL Cholesterol Cal: 20 mg/dL (ref 5–40)

## 2020-09-17 LAB — URIC ACID: Uric Acid: 5.5 mg/dL (ref 3.8–8.4)

## 2020-10-20 ENCOUNTER — Other Ambulatory Visit: Payer: Self-pay | Admitting: Family Medicine

## 2020-10-20 MED ORDER — ALLOPURINOL 100 MG PO TABS: 100.0000 mg | ORAL_TABLET | Freq: Every day | ORAL | 1 refills | Status: DC

## 2020-10-20 NOTE — Telephone Encounter (Signed)
See PEC note below °

## 2020-10-20 NOTE — Telephone Encounter (Signed)
Medication: allopurinol (ZYLOPRIM) 100 MG tablet [[id number]] - Patient is requesting sig to state script to state every day  Has the patient contacted their pharmacy? YES (Agent: If no, request that the patient contact the pharmacy for the refill.) (Agent: If yes, when and what did the pharmacy advise?)  Preferred Pharmacy (with phone number or street name): [facility] - [place], [place] - [address]  [address], [place] [address]  Phone:  [phone] Fax:  [phone]   Agent: Please be advised that RX refills may take up to 3 business days. We ask that you follow-up with your pharmacy.

## 2020-10-20 NOTE — Telephone Encounter (Signed)
Patient would like directions to say take everyday. Please advise

## 2020-11-12 ENCOUNTER — Telehealth: Payer: Self-pay

## 2020-11-12 DIAGNOSIS — R2689 Other abnormalities of gait and mobility: Secondary | ICD-10-CM

## 2020-11-12 NOTE — Telephone Encounter (Signed)
Pt stated he has not heard anything from referral for his issues with his balance. Please advise.

## 2020-11-12 NOTE — Telephone Encounter (Signed)
Spoke with [place] at [facility]. She is happy to accept this referral. Can we please put a new order in. She states that she can use previous face to face.

## 2020-11-17 ENCOUNTER — Ambulatory Visit (INDEPENDENT_AMBULATORY_CARE_PROVIDER_SITE_OTHER): Payer: Medicare Other | Admitting: Family Medicine

## 2020-11-17 ENCOUNTER — Encounter: Payer: Self-pay | Admitting: Family Medicine

## 2020-11-17 ENCOUNTER — Other Ambulatory Visit: Payer: Self-pay

## 2020-11-17 VITALS — BP 155/77 | HR 62 | Temp 97.8°F | Wt 167.6 lb

## 2020-11-17 DIAGNOSIS — M21619 Bunion of unspecified foot: Secondary | ICD-10-CM

## 2020-11-17 DIAGNOSIS — R2689 Other abnormalities of gait and mobility: Secondary | ICD-10-CM | POA: Diagnosis not present

## 2020-11-17 NOTE — Addendum Note (Signed)
Addended by: Dorcas Carrow on: 11/17/2020 09:33 AM   Modules accepted: Orders

## 2020-11-17 NOTE — Addendum Note (Signed)
Addended by: Dorcas Carrow on: 11/17/2020 08:55 AM   Modules accepted: Orders

## 2020-11-17 NOTE — Progress Notes (Signed)
BP (!) 155/77   Pulse 62   Temp 97.8 F (36.6 C)   Wt 167 lb 9.6 oz (76 kg)   SpO2 97%   BMI 24.05 kg/m    Subjective:    Patient ID: Carlos Richard, male    DOB: [birth year], 87 y.o.   MRN: [id number]  HPI: Carlos Richard is a 87 y.o. male  Chief Complaint  Patient presents with  . balance follow up    Pt states home health assistance never reached out to him for PT   No falls. Balance is about the same. He has not been called by home health. He has not had any PT. He's been having issues with his balance for about 3 months now.   He notes that his R great toe and 2nd toe have hurt a lot. He has a bunion and the only thing that helps it feel better is to keep a pad between the 2 toes. He notes that it makes him feel more off balance.   Relevant past medical, surgical, family and social history reviewed and updated as indicated. Interim medical history since our last visit reviewed. Allergies and medications reviewed and updated.  Review of Systems  Constitutional: Negative.   Respiratory: Negative.   Cardiovascular: Negative.   Gastrointestinal: Negative.   Musculoskeletal: Positive for gait problem. Negative for arthralgias, back pain, joint swelling, myalgias, neck pain and neck stiffness.  Skin: Negative.   Neurological: Positive for weakness. Negative for dizziness, tremors, seizures, syncope, facial asymmetry, speech difficulty, light-headedness, numbness and headaches.  Psychiatric/Behavioral: Negative.     Per HPI unless specifically indicated above     Objective:    BP (!) 155/77   Pulse 62   Temp 97.8 F (36.6 C)   Wt 167 lb 9.6 oz (76 kg)   SpO2 97%   BMI 24.05 kg/m   Wt Readings from Last 3 Encounters:  11/17/20 167 lb 9.6 oz (76 kg)  09/16/20 164 lb (74.4 kg)  04/22/20 161 lb (73 kg)    Physical Exam Vitals and nursing note reviewed.  Constitutional:      General: He is not in acute distress.    Appearance: Normal appearance. He is not  ill-appearing, toxic-appearing or diaphoretic.  HENT:     Head: Normocephalic and atraumatic.     Right Ear: External ear normal.     Left Ear: External ear normal.     Nose: Nose normal.     Mouth/Throat:     Mouth: Mucous membranes are moist.     Pharynx: Oropharynx is clear.  Eyes:     General: No scleral icterus.       Right eye: No discharge.        Left eye: No discharge.     Extraocular Movements: Extraocular movements intact.     Conjunctiva/sclera: Conjunctivae normal.     Pupils: Pupils are equal, round, and reactive to light.  Cardiovascular:     Rate and Rhythm: Normal rate and regular rhythm.     Pulses: Normal pulses.     Heart sounds: Normal heart sounds. No murmur heard. No friction rub. No gallop.   Pulmonary:     Effort: Pulmonary effort is normal. No respiratory distress.     Breath sounds: Normal breath sounds. No stridor. No wheezing, rhonchi or rales.  Chest:     Chest wall: No tenderness.  Musculoskeletal:        General: Normal range of motion.  Cervical back: Normal range of motion and neck supple.     Comments: Bunion L foot  Skin:    General: Skin is warm and dry.     Capillary Refill: Capillary refill takes less than 2 seconds.     Coloration: Skin is not jaundiced or pale.     Findings: No bruising, erythema, lesion or rash.  Neurological:     General: No focal deficit present.     Mental Status: He is alert and oriented to person, place, and time. Mental status is at baseline.  Psychiatric:        Mood and Affect: Mood normal.        Behavior: Behavior normal.        Thought Content: Thought content normal.        Judgment: Judgment normal.     Results for orders placed or performed in visit on 09/16/20  Uric acid  Result Value Ref Range   Uric Acid 5.5 3.8 - 8.4 mg/dL  Lipid Panel w/o Chol/HDL Ratio  Result Value Ref Range   Cholesterol, Total 132 100 - 199 mg/dL   Triglycerides 270 0 - 149 mg/dL   HDL 45 >62 mg/dL   VLDL  Cholesterol Cal 20 5 - 40 mg/dL   LDL Chol Calc (NIH) 67 0 - 99 mg/dL  Comprehensive metabolic panel  Result Value Ref Range   Glucose 86 65 - 99 mg/dL   BUN 9 8 - 27 mg/dL   Creatinine, Ser 3.76 0.76 - 1.27 mg/dL   GFR calc non Af Amer 53 (L) >59 mL/min/1.73   GFR calc Af Amer 61 >59 mL/min/1.73   BUN/Creatinine Ratio 7 (L) 10 - 24   Sodium 144 134 - 144 mmol/L   Potassium 4.2 3.5 - 5.2 mmol/L   Chloride 106 96 - 106 mmol/L   CO2 27 20 - 29 mmol/L   Calcium 9.2 8.6 - 10.2 mg/dL   Total Protein 6.3 6.0 - 8.5 g/dL   Albumin 4.2 3.6 - 4.6 g/dL   Globulin, Total 2.1 1.5 - 4.5 g/dL   Albumin/Globulin Ratio 2.0 1.2 - 2.2   Bilirubin Total 0.5 0.0 - 1.2 mg/dL   Alkaline Phosphatase 65 44 - 121 IU/L   AST 27 0 - 40 IU/L   ALT 19 0 - 44 IU/L  CBC with Differential/Platelet  Result Value Ref Range   WBC 7.4 3.4 - 10.8 x10E3/uL   RBC 4.50 4.14 - 5.80 x10E6/uL   Hemoglobin 13.7 13.0 - 17.7 g/dL   Hematocrit 28.3 15.1 - 51.0 %   MCV 92 79 - 97 fL   MCH 30.4 26.6 - 33.0 pg   MCHC 33.0 31.5 - 35.7 g/dL   RDW 76.1 60.7 - 37.1 %   Platelets 191 150 - 450 x10E3/uL   Neutrophils 47 Not Estab. %   Lymphs 40 Not Estab. %   Monocytes 8 Not Estab. %   Eos 4 Not Estab. %   Basos 1 Not Estab. %   Neutrophils Absolute 3.6 1.4 - 7.0 x10E3/uL   Lymphocytes Absolute 2.9 0.7 - 3.1 x10E3/uL   Monocytes Absolute 0.6 0.1 - 0.9 x10E3/uL   EOS (ABSOLUTE) 0.3 0.0 - 0.4 x10E3/uL   Basophils Absolute 0.1 0.0 - 0.2 x10E3/uL   Immature Granulocytes 0 Not Estab. %   Immature Grans (Abs) 0.0 0.0 - 0.1 x10E3/uL      Assessment & Plan:   Problem List Items Addressed This Visit   None   Visit  Diagnoses    Balance problem    -  Primary   Has not gotten in to see PT yet. New referral generated for home health today. Continue to monitor.    Bunion of great toe       Would like to see podiatry. Referral generated today.    Relevant Orders   Ambulatory referral to Podiatry       Follow up plan: Return  As scheduled.

## 2020-11-19 ENCOUNTER — Ambulatory Visit (INDEPENDENT_AMBULATORY_CARE_PROVIDER_SITE_OTHER): Payer: Medicare Other

## 2020-11-19 ENCOUNTER — Ambulatory Visit (INDEPENDENT_AMBULATORY_CARE_PROVIDER_SITE_OTHER): Payer: Medicare Other | Admitting: Podiatry

## 2020-11-19 ENCOUNTER — Encounter: Payer: Self-pay | Admitting: Podiatry

## 2020-11-19 ENCOUNTER — Other Ambulatory Visit: Payer: Self-pay

## 2020-11-19 DIAGNOSIS — M2011 Hallux valgus (acquired), right foot: Secondary | ICD-10-CM

## 2020-11-19 DIAGNOSIS — M2041 Other hammer toe(s) (acquired), right foot: Secondary | ICD-10-CM | POA: Diagnosis not present

## 2020-11-19 DIAGNOSIS — M21611 Bunion of right foot: Secondary | ICD-10-CM | POA: Diagnosis not present

## 2020-11-19 DIAGNOSIS — M205X1 Other deformities of toe(s) (acquired), right foot: Secondary | ICD-10-CM

## 2020-11-19 NOTE — Progress Notes (Signed)
  Subjective:  Patient ID: Carlos Richard, male    DOB: [birth year],  MRN: [id number]  Chief Complaint  Patient presents with  . Bunions  . Hammer Toe    Patient present with bunion to right foot and hammer toe to 2nd toe on the right foot. Patient is having discomfort when ambulating.     87 y.o. male presents with the above complaint. History confirmed with patient.  The bunion itself is not painful.  The main pain he has is the tip of the toe with the nail was pushing against his shoes.  He recently new pair wider shoes  Objective:  Physical Exam: warm, good capillary refill, no trophic changes or ulcerative lesions, normal DP and PT pulses and normal sensory exam.Varicosities noted  Right Foot: He has hallux valgus, mallet toe deformities of 2,3,4, hyperkeratosis and thickening of the nail plate of second toenail   Radiographs: X-ray of the right foot: Hallux abductovalgus is present with a long second metatarsal and second ray with mallet toe deformity Assessment:   1. Mallet toe of right foot   2. Hallux valgus with bunions, right      Plan:  Patient was evaluated and treated and all questions answered.   Discussed surgical nonsurgical treatment options for bunions and hammertoes.  Currently the bunion is not painful.  Given his age and activity level I think we should address this surgically.  Advised on wider shoe gear and continue to monitor this.  For the toe pain I recommended treatment with padding with a silicone toe cap.  Also discussed nail care so that the nail does not become so thick and painful.  Discussed arthroplasty as an option but would do this as a last resort.  Return if symptoms worsen or fail to improve.

## 2021-01-28 ENCOUNTER — Telehealth: Payer: Self-pay | Admitting: Family Medicine

## 2021-01-28 NOTE — Telephone Encounter (Signed)
Copied from CRM [id number]. Topic: Medicare AWV >> Jan 28, 2021 10:47 AM Carlos Richard R wrote: Reason for CRM:   3/16 Left message to notify AWVS has been reschedule from March 28th  to Arpil 1st at  9:45 am to be completed by phone not in the office -srs

## 2021-02-09 ENCOUNTER — Ambulatory Visit: Payer: Medicare Other

## 2021-02-13 ENCOUNTER — Ambulatory Visit: Payer: Medicare Other

## 2021-02-16 ENCOUNTER — Ambulatory Visit (INDEPENDENT_AMBULATORY_CARE_PROVIDER_SITE_OTHER): Payer: Medicare Other

## 2021-02-16 VITALS — Ht 70.0 in | Wt 158.0 lb

## 2021-02-16 DIAGNOSIS — Z0000 Encounter for general adult medical examination without abnormal findings: Secondary | ICD-10-CM | POA: Diagnosis not present

## 2021-02-16 NOTE — Patient Instructions (Signed)
[name] , Thank you for taking time to come for your Medicare Wellness Visit. I appreciate your ongoing commitment to your health goals. Please review the following plan we discussed and let me know if I can assist you in the future.   Screening recommendations/referrals: Colonoscopy: not required Recommended yearly ophthalmology/optometry visit for glaucoma screening and checkup Recommended yearly dental visit for hygiene and checkup  Vaccinations: Influenza vaccine: completed 08/21/2020, due 06/15/2021 Pneumococcal vaccine: due Tdap vaccine: completed 04/29/2015, due 04/28/2025 Shingles vaccine: completde   Covid-19:  07/07/2020, 10/25/2019, 09/27/2019  Advanced directives: Advance directive discussed with you today.   Conditions/risks identified: none  Next appointment: Follow up in one year for your annual wellness visit.   Preventive Care 19 Years and Older, Male Preventive care refers to lifestyle choices and visits with your health care provider that can promote health and wellness. What does preventive care include?  A yearly physical exam. This is also called an annual well check.  Dental exams once or twice a year.  Routine eye exams. Ask your health care provider how often you should have your eyes checked.  Personal lifestyle choices, including:  Daily care of your teeth and gums.  Regular physical activity.  Eating a healthy diet.  Avoiding tobacco and drug use.  Limiting alcohol use.  Practicing safe sex.  Taking low doses of aspirin every day.  Taking vitamin and mineral supplements as recommended by your health care provider. What happens during an annual well check? The services and screenings done by your health care provider during your annual well check will depend on your age, overall health, lifestyle risk factors, and family history of disease. Counseling  Your health care provider may ask you questions about your:  Alcohol use.  Tobacco  use.  Drug use.  Emotional well-being.  Home and relationship well-being.  Sexual activity.  Eating habits.  History of falls.  Memory and ability to understand (cognition).  Work and work Astronomer. Screening  You may have the following tests or measurements:  Height, weight, and BMI.  Blood pressure.  Lipid and cholesterol levels. These may be checked every 5 years, or more frequently if you are over 20 years old.  Skin check.  Lung cancer screening. You may have this screening every year starting at age 31 if you have a 30-pack-year history of smoking and currently smoke or have quit within the past 15 years.  Fecal occult blood test (FOBT) of the stool. You may have this test every year starting at age 40.  Flexible sigmoidoscopy or colonoscopy. You may have a sigmoidoscopy every 5 years or a colonoscopy every 10 years starting at age 34.  Prostate cancer screening. Recommendations will vary depending on your family history and other risks.  Hepatitis C blood test.  Hepatitis B blood test.  Sexually transmitted disease (STD) testing.  Diabetes screening. This is done by checking your blood sugar (glucose) after you have not eaten for a while (fasting). You may have this done every 1-3 years.  Abdominal aortic aneurysm (AAA) screening. You may need this if you are a current or former smoker.  Osteoporosis. You may be screened starting at age 45 if you are at high risk. Talk with your health care provider about your test results, treatment options, and if necessary, the need for more tests. Vaccines  Your health care provider may recommend certain vaccines, such as:  Influenza vaccine. This is recommended every year.  Tetanus, diphtheria, and acellular pertussis (Tdap, Td)  vaccine. You may need a Td booster every 10 years.  Zoster vaccine. You may need this after age 15.  Pneumococcal 13-valent conjugate (PCV13) vaccine. One dose is recommended after age  65.  Pneumococcal polysaccharide (PPSV23) vaccine. One dose is recommended after age 48. Talk to your health care provider about which screenings and vaccines you need and how often you need them. This information is not intended to replace advice given to you by your health care provider. Make sure you discuss any questions you have with your health care provider. Document Released: 11/28/2015 Document Revised: 07/21/2016 Document Reviewed: 09/02/2015 Elsevier Interactive Patient Education  2017 [place] Prevention in the Home Falls can cause injuries. They can happen to people of all ages. There are many things you can do to make your home safe and to help prevent falls. What can I do on the outside of my home?  Regularly fix the edges of walkways and driveways and fix any cracks.  Remove anything that might make you trip as you walk through a door, such as a raised step or threshold.  Trim any bushes or trees on the path to your home.  Use bright outdoor lighting.  Clear any walking paths of anything that might make someone trip, such as rocks or tools.  Regularly check to see if handrails are loose or broken. Make sure that both sides of any steps have handrails.  Any raised decks and porches should have guardrails on the edges.  Have any leaves, snow, or ice cleared regularly.  Use sand or salt on walking paths during winter.  Clean up any spills in your garage right away. This includes oil or grease spills. What can I do in the bathroom?  Use night lights.  Install grab bars by the toilet and in the tub and shower. Do not use towel bars as grab bars.  Use non-skid mats or decals in the tub or shower.  If you need to sit down in the shower, use a plastic, non-slip stool.  Keep the floor dry. Clean up any water that spills on the floor as soon as it happens.  Remove soap buildup in the tub or shower regularly.  Attach bath mats securely with double-sided  non-slip rug tape.  Do not have throw rugs and other things on the floor that can make you trip. What can I do in the bedroom?  Use night lights.  Make sure that you have a light by your bed that is easy to reach.  Do not use any sheets or blankets that are too big for your bed. They should not hang down onto the floor.  Have a firm chair that has side arms. You can use this for support while you get dressed.  Do not have throw rugs and other things on the floor that can make you trip. What can I do in the kitchen?  Clean up any spills right away.  Avoid walking on wet floors.  Keep items that you use a lot in easy-to-reach places.  If you need to reach something above you, use a strong step stool that has a grab bar.  Keep electrical cords out of the way.  Do not use floor polish or wax that makes floors slippery. If you must use wax, use non-skid floor wax.  Do not have throw rugs and other things on the floor that can make you trip. What can I do with my stairs?  Do not leave  any items on the stairs.  Make sure that there are handrails on both sides of the stairs and use them. Fix handrails that are broken or loose. Make sure that handrails are as long as the stairways.  Check any carpeting to make sure that it is firmly attached to the stairs. Fix any carpet that is loose or worn.  Avoid having throw rugs at the top or bottom of the stairs. If you do have throw rugs, attach them to the floor with carpet tape.  Make sure that you have a light switch at the top of the stairs and the bottom of the stairs. If you do not have them, ask someone to add them for you. What else can I do to help prevent falls?  Wear shoes that:  Do not have high heels.  Have rubber bottoms.  Are comfortable and fit you well.  Are closed at the toe. Do not wear sandals.  If you use a stepladder:  Make sure that it is fully opened. Do not climb a closed stepladder.  Make sure that both  sides of the stepladder are locked into place.  Ask someone to hold it for you, if possible.  Clearly mark and make sure that you can see:  Any grab bars or handrails.  First and last steps.  Where the edge of each step is.  Use tools that help you move around (mobility aids) if they are needed. These include:  Canes.  Walkers.  Scooters.  Crutches.  Turn on the lights when you go into a dark area. Replace any light bulbs as soon as they burn out.  Set up your furniture so you have a clear path. Avoid moving your furniture around.  If any of your floors are uneven, fix them.  If there are any pets around you, be aware of where they are.  Review your medicines with your doctor. Some medicines can make you feel dizzy. This can increase your chance of falling. Ask your doctor what other things that you can do to help prevent falls. This information is not intended to replace advice given to you by your health care provider. Make sure you discuss any questions you have with your health care provider. Document Released: 08/28/2009 Document Revised: 04/08/2016 Document Reviewed: 12/06/2014 Elsevier Interactive Patient Education  2017 [employer].

## 2021-02-16 NOTE — Progress Notes (Signed)
I connected with Doylene Bode today by telephone and verified that I am speaking with the correct person using two identifiers. Location patient: home Location provider: work Persons participating in the virtual visit: Borna Wessinger, Yaakov Saindon (spouse), Elisha Ponder LPN.   I discussed the limitations, risks, security and privacy concerns of performing an evaluation and management service by telephone and the availability of in person appointments. I also discussed with the patient that there may be a patient responsible charge related to this service. The patient expressed understanding and verbally consented to this telephonic visit.    Interactive audio and video telecommunications were attempted between this provider and patient, however failed, due to patient having technical difficulties OR patient did not have access to video capability.  We continued and completed visit with audio only.     Vital signs may be patient reported or missing.  Subjective:   Carlos Richard is a 87 y.o. male who presents for Medicare Annual/Subsequent preventive examination.  Review of Systems     Cardiac Risk Factors include: advanced age (>70men, >78 women);dyslipidemia;hypertension;male gender     Objective:    Today's Vitals   02/16/21 1428  Weight: 158 lb (71.7 kg)  Height: 5\' 10"  (1.778 m)   Body mass index is 22.67 kg/m.  Advanced Directives 02/16/2021 04/22/2020 02/04/2020 10/31/2019 01/31/2019 07/03/2015 04/29/2015  Does Patient Have a Medical Advance Directive? No No No No No No No  Would patient like information on creating a medical advance directive? - - - No - Patient declined No - Patient declined No - patient declined information No - patient declined information    Current Medications (verified) Outpatient Encounter Medications as of 02/16/2021  Medication Sig  . allopurinol (ZYLOPRIM) 100 MG tablet Take 1 tablet (100 mg total) by mouth daily. Monday, Wednesday, Friday  .  finasteride (PROSCAR) 5 MG tablet Take 1 tablet (5 mg total) by mouth daily.  . Fluticasone-Salmeterol (ADVAIR) 250-50 MCG/DOSE AEPB Inhale 1 puff into the lungs 2 (two) times daily.  . furosemide (LASIX) 20 MG tablet Take 20 mg by mouth 3 (three) times a week. Monday, Wednesday, Friday  . losartan (COZAAR) 50 MG tablet Take 1 tablet (50 mg total) by mouth daily.  . magnesium oxide (MAG-OX) 400 MG tablet Take 400 mg by mouth daily.  . metoprolol tartrate (LOPRESSOR) 25 MG tablet Take 12.5 mg by mouth 3 (three) times a week.   . Multiple Vitamins-Minerals (ZINC PO) Take 1 tablet by mouth daily.  . simvastatin (ZOCOR) 20 MG tablet Take 1 tablet (20 mg total) by mouth daily.  Wednesday terazosin (HYTRIN) 5 MG capsule Take 1 capsule (5 mg total) by mouth at bedtime.  Marland Kitchen UNABLE TO FIND CuraMed  . vitamin E 200 UNIT capsule Take 200 Units by mouth daily.  Marland Kitchen 15 MG TABS tablet Take 15 mg by mouth daily with supper.   . Zinc 50 MG TABS Take 50 mg by mouth daily.    No facility-administered encounter medications on file as of 02/16/2021.    Allergies (verified) Patient has no known allergies.   History: Past Medical History:  Diagnosis Date  . Atrial fibrillation (HCC)   . COPD (chronic obstructive pulmonary disease) (HCC)   . Hypertension   . Near syncope   . Tremors of nervous system    Past Surgical History:  Procedure Laterality Date  . BACK SURGERY    . BRAIN SURGERY     stimulator placement for tremors  . EYE SURGERY  Left 2020   cataract surgery nov   . EYE SURGERY Right 2020   cataract surgery dec   Family History  Problem Relation Age of Onset  . Tuberculosis Mother   . Tremor Paternal Grandmother   . Obesity Sister   . Prostate cancer Neg Hx   . Bladder Cancer Neg Hx   . Kidney cancer Neg Hx    Social History   Socioeconomic History  . Marital status: Married    Spouse name: Not on file  . Number of children: Not on file  . Years of education: Not on file  . Highest  education level: Associate degree: academic program  Occupational History  . Not on file  Tobacco Use  . Smoking status: Former Smoker    Types: Cigarettes    Quit date: 1980    Years since quitting: 42.2  . Smokeless tobacco: Never Used  . Tobacco comment: quit 40 years ago   Vaping Use  . Vaping Use: Never used  Substance and Sexual Activity  . Alcohol use: Yes    Comment: occasionally  . Drug use: No  . Sexual activity: Yes  Other Topics Concern  . Not on file  Social History Narrative  . Not on file   Social Determinants of Health   Financial Resource Strain: Low Risk   . Difficulty of Paying Living Expenses: Not hard at all  Food Insecurity: No Food Insecurity  . Worried About Programme researcher, broadcasting/film/video in the Last Year: Never true  . Ran Out of Food in the Last Year: Never true  Transportation Needs: No Transportation Needs  . Lack of Transportation (Medical): No  . Lack of Transportation (Non-Medical): No  Physical Activity: Insufficiently Active  . Days of Exercise per Week: 5 days  . Minutes of Exercise per Session: 20 min  Stress: No Stress Concern Present  . Feeling of Stress : Not at all  Social Connections: Not on file    Tobacco Counseling Counseling given: Not Answered Comment: quit 40 years ago    Clinical Intake:  Pre-visit preparation completed: Yes  Pain : No/denies pain     Nutritional Status: BMI of 19-24  Normal Nutritional Risks: None Diabetes: No  How often do you need to have someone help you when you read instructions, pamphlets, or other written materials from your doctor or pharmacy?: 1 - Never What is the last grade level you completed in school?: college  Diabetic? no  Interpreter Needed?: No  Information entered by :: NAllen LPN   Activities of Daily Living In your present state of health, do you have any difficulty performing the following activities: 02/16/2021  Hearing? N  Vision? N  Difficulty concentrating or making  decisions? N  Walking or climbing stairs? N  Dressing or bathing? N  Doing errands, shopping? N  Preparing Food and eating ? N  Using the Toilet? N  In the past six months, have you accidently leaked urine? N  Do you have problems with loss of bowel control? N  Managing your Medications? Y  Comment spouse manages  Managing your Finances? N  Housekeeping or managing your Housekeeping? N  Some recent data might be hidden    Patient Care Team: Dorcas Carrow, DO as PCP - General (Family Medicine)  Indicate any recent Medical Services you may have received from other than [facility] providers in the past year (date may be approximate).     Assessment:   This is a routine wellness  examination for [facility].  Hearing/Vision screen No exam data present  Dietary issues and exercise activities discussed: Current Exercise Habits: Home exercise routine, Type of exercise: walking, Time (Minutes): 20, Frequency (Times/Week): 5, Weekly Exercise (Minutes/Week): 100  Goals    . Patient Stated     02/16/2021, stay alive      Depression Screen PHQ 2/9 Scores 02/16/2021 09/16/2020 02/04/2020 01/31/2019 08/03/2018  PHQ - 2 Score 0 0 0 0 0  PHQ- 9 Score - 0 - - -    Fall Risk Fall Risk  02/16/2021 09/16/2020 02/04/2020 02/13/2019 01/31/2019  Falls in the past year? 0 0 0 0 0  Number falls in past yr: - - 0 - -  Injury with Fall? - - 0 - -  Risk for fall due to : Medication side effect - - - -  Follow up Falls evaluation completed;Education provided;Falls prevention discussed Falls evaluation completed - Falls evaluation completed -    FALL RISK PREVENTION PERTAINING TO THE HOME:  Any stairs in or around the home? Yes  If so, are there any without handrails? No  Home free of loose throw rugs in walkways, pet beds, electrical cords, etc? Yes  Adequate lighting in your home to reduce risk of falls? Yes   ASSISTIVE DEVICES UTILIZED TO PREVENT FALLS:  Life alert? No  Use of a cane, walker or w/c? No   Grab bars in the bathroom? Yes  Shower chair or bench in shower? No  Elevated toilet seat or a handicapped toilet? No   TIMED UP AND GO:  Was the test performed? No .  .   Cognitive Function:     6CIT Screen 02/16/2021 02/04/2020 01/31/2019  What Year? 0 points 0 points 0 points  What month? 0 points 0 points 0 points  What time? 0 points 0 points 0 points  Count back from 20 0 points 0 points 0 points  Months in reverse 4 points 0 points 0 points  Repeat phrase 6 points 0 points 0 points  Total Score 10 0 0    Immunizations Immunization History  Administered Date(s) Administered  . Influenza-Unspecified 08/29/2018, 08/16/2019, 08/21/2020  . PFIZER(Purple Top)SARS-COV-2 Vaccination 09/27/2019, 10/25/2019  . Pneumococcal Conjugate-13 08/14/2019  . Td 04/29/2015  . Zoster Recombinat (Shingrix) 05/27/2018, 08/25/2018    TDAP status: Up to date  Flu Vaccine status: Up to date  Pneumococcal vaccine status: Due, Education has been provided regarding the importance of this vaccine. Advised may receive this vaccine at local pharmacy or Health Dept. Aware to provide a copy of the vaccination record if obtained from local pharmacy or Health Dept. Verbalized acceptance and understanding.  Covid-19 vaccine status: Completed vaccines  Qualifies for Shingles Vaccine? Yes   Zostavax completed No   Shingrix Completed?: Yes  Screening Tests Health Maintenance  Topic Date Due  . COVID-19 Vaccine (3 - Booster for Pfizer series) 04/24/2020  . PNA vac Low Risk Adult (2 of 2 - PPSV23) 09/16/2021 (Originally 08/13/2020)  . INFLUENZA VACCINE  06/15/2021  . TETANUS/TDAP  04/28/2025  . HPV VACCINES  Aged Out    Health Maintenance  Health Maintenance Due  Topic Date Due  . COVID-19 Vaccine (3 - Booster for Pfizer series) 04/24/2020    Colorectal cancer screening: No longer required.   Lung Cancer Screening: (Low Dose CT Chest recommended if Age 50-80 years, 30 pack-year currently  smoking OR have quit w/in 15years.) does not qualify.   Lung Cancer Screening Referral: no  Additional Screening:  Hepatitis C Screening: does not qualify;   Vision Screening: Recommended annual ophthalmology exams for early detection of glaucoma and other disorders of the eye. Is the patient up to date with their annual eye exam?  Yes  Who is the provider or what is the name of the office in which the patient attends annual eye exams? Dr. Judie GrieveBryan If pt is not established with a provider, would they like to be referred to a provider to establish care? No .   Dental Screening: Recommended annual dental exams for proper oral hygiene  Community Resource Referral / Chronic Care Management: CRR required this visit?  No   CCM required this visit?  No      Plan:     I have personally reviewed and noted the following in the patient's chart:   . Medical and social history . Use of alcohol, tobacco or illicit drugs  . Current medications and supplements . Functional ability and status . Nutritional status . Physical activity . Advanced directives . List of other physicians . Hospitalizations, surgeries, and ER visits in previous 12 months . Vitals . Screenings to include cognitive, depression, and falls . Referrals and appointments  In addition, I have reviewed and discussed with patient certain preventive protocols, quality metrics, and best practice recommendations. A written personalized care plan for preventive services as well as general preventive health recommendations were provided to patient.     Barb Merinoickeah E Aria Jarrard, LPN   1/0/27254/02/2021   Nurse Notes:

## 2021-03-04 ENCOUNTER — Other Ambulatory Visit: Payer: Self-pay | Admitting: Family Medicine

## 2021-03-07 IMAGING — CR DG CHEST 2V
1 series · 2 of 2 positions shown · non-contrast
Comparison: 11/13/2013

CLINICAL DATA: Near syncope.

EXAM:
CHEST - 2 VIEW

[Series 1: dg chest 2 view · 0.14mm/px · 2 of 2 slices shown]
[im 1/2]
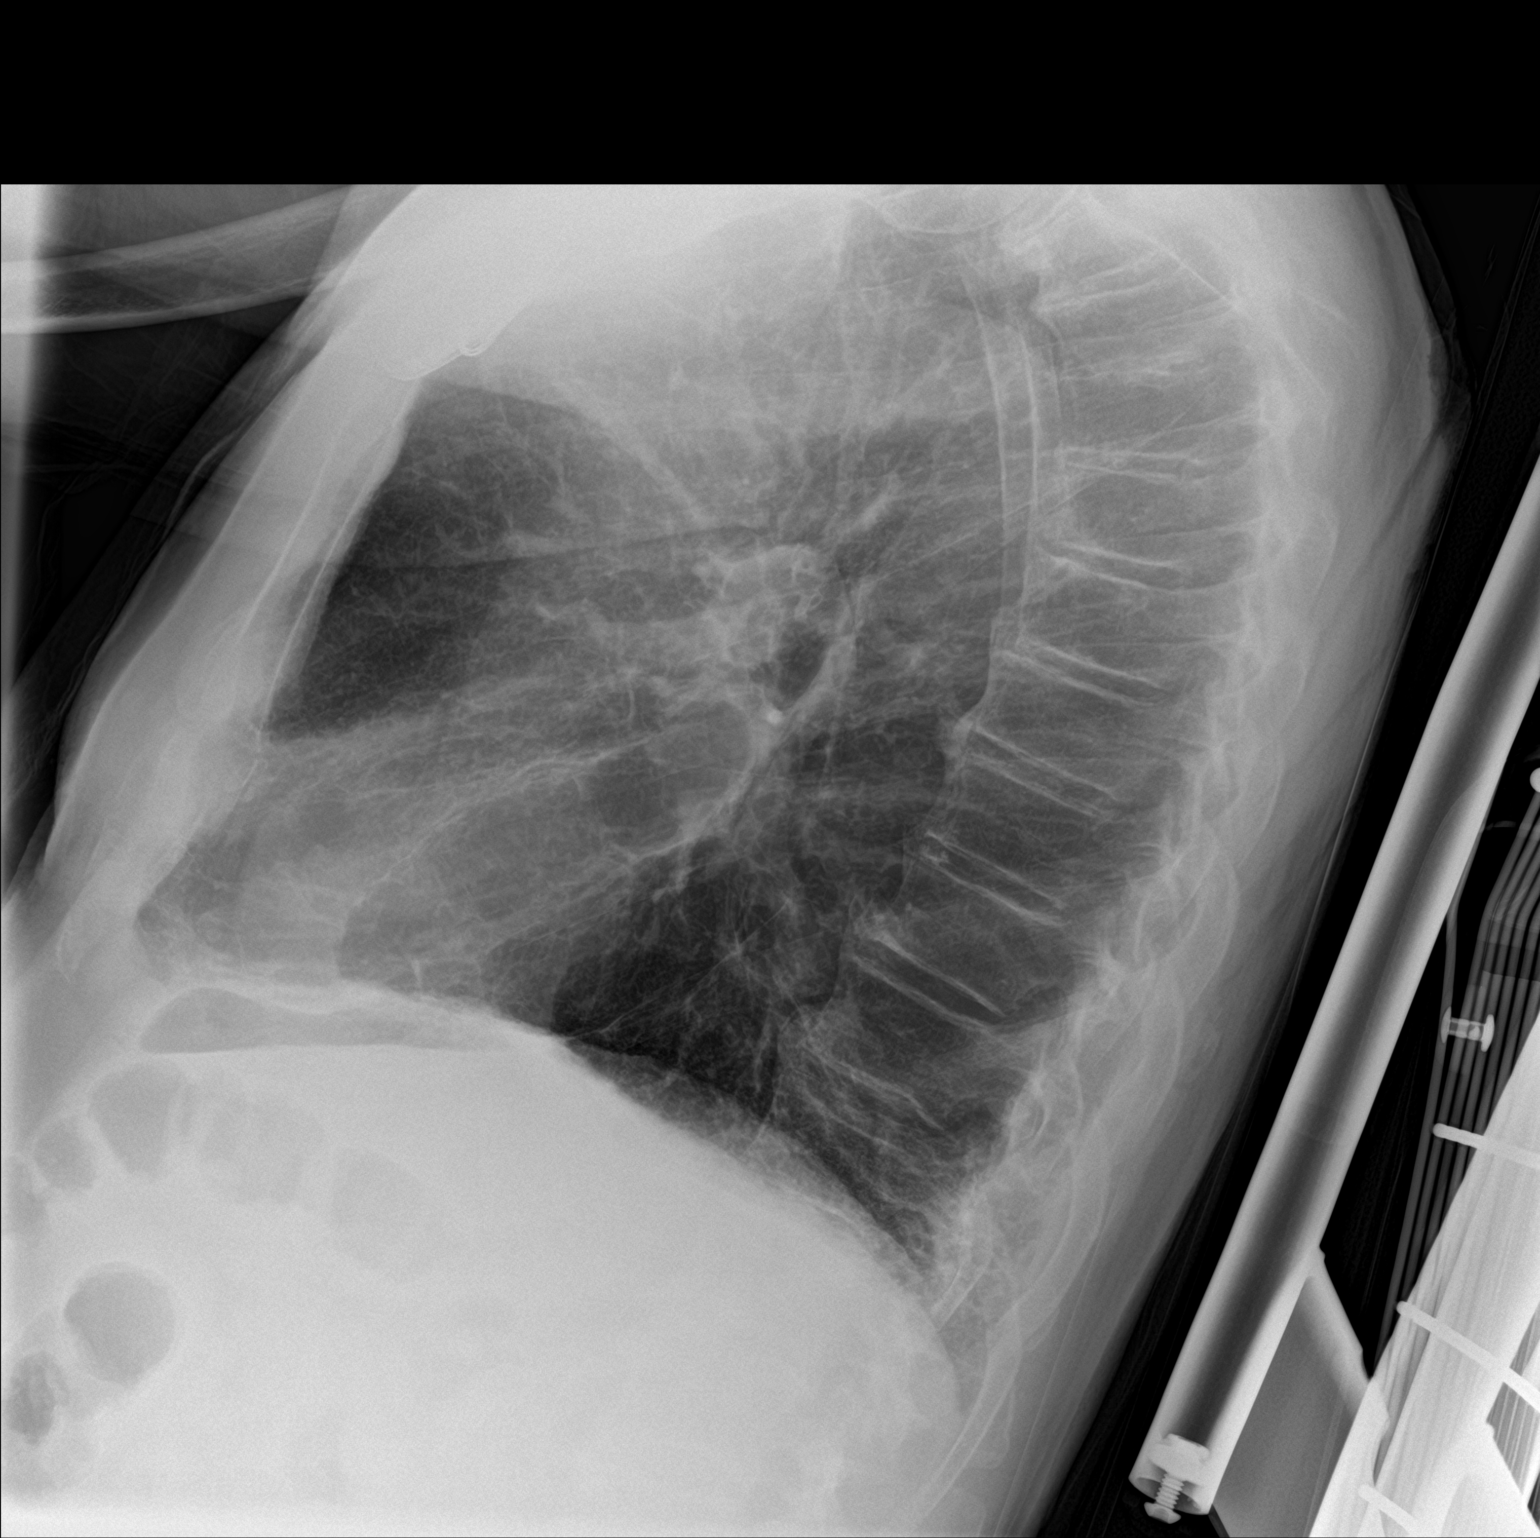
[im 2/2]
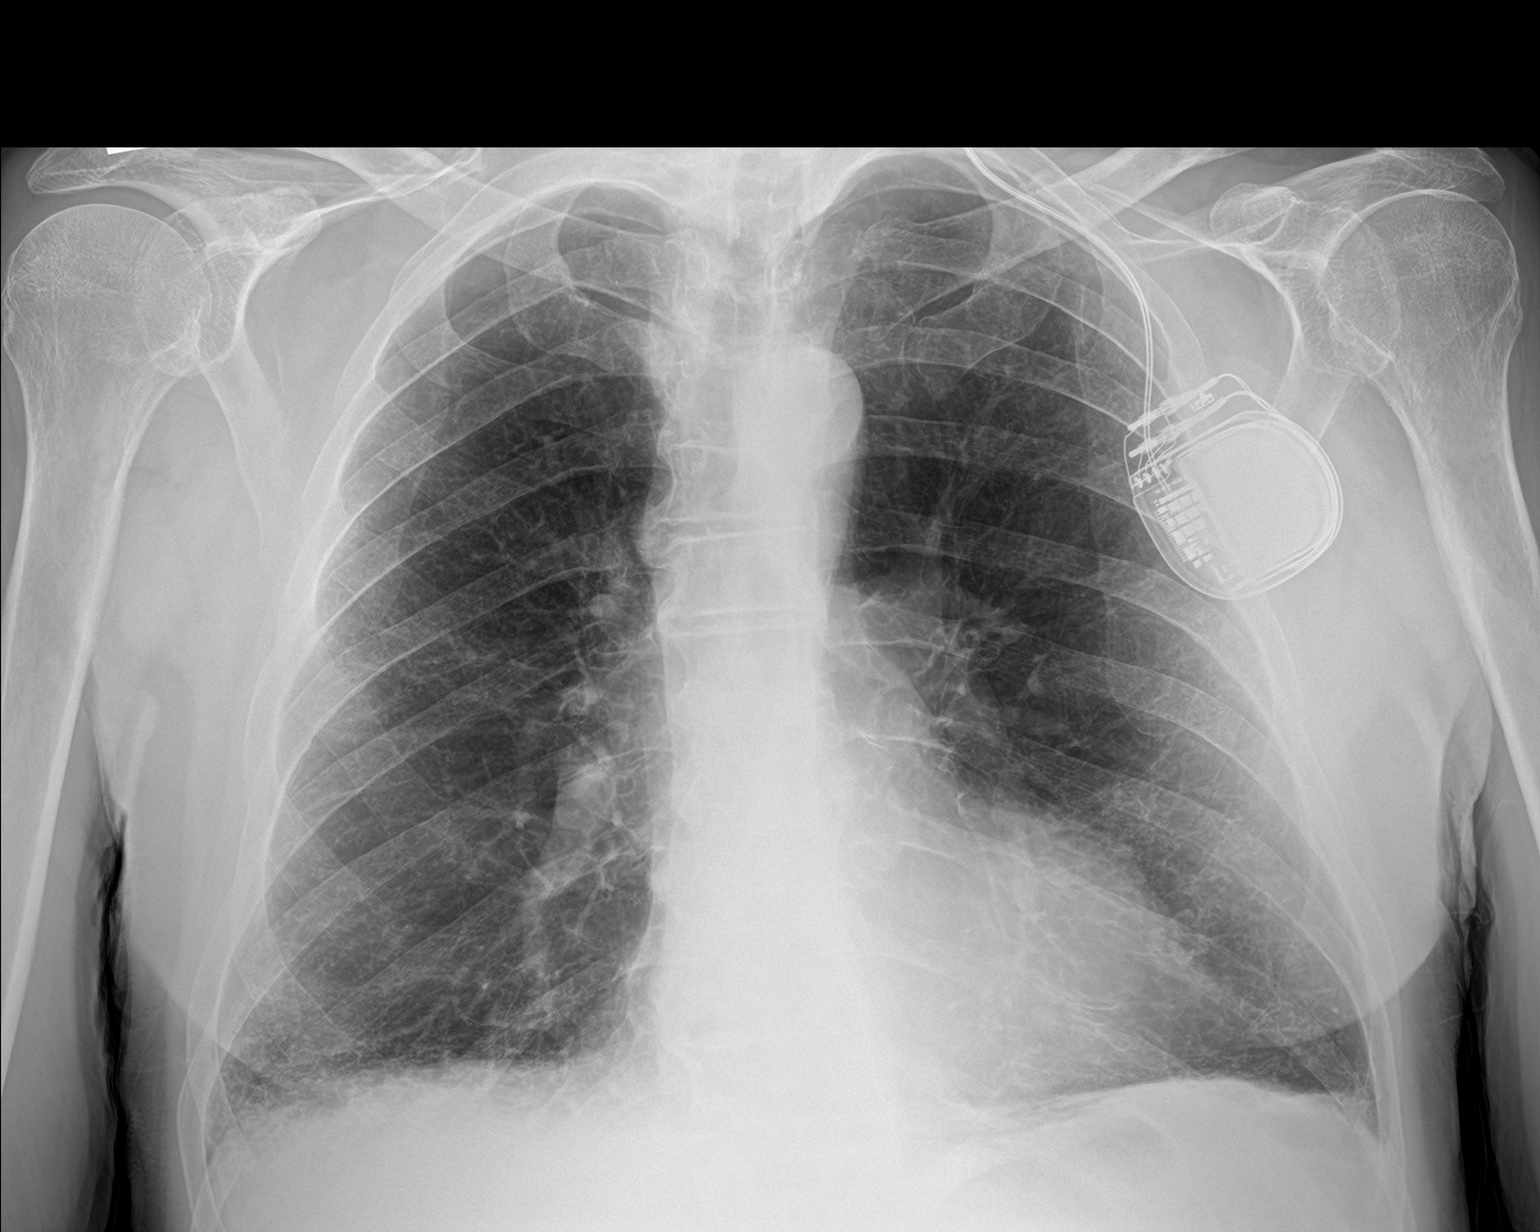

[2 of 2 positions shown; findings below may reference images not displayed]

FINDINGS: Normal heart size and mediastinal contours. The lungs are
hyperinflated. Increased reticular opacities in the lung bases in
peripherally. No pleural fluid, pneumothorax, or confluent airspace
disease. Battery pack projects over the left chest wall with lead
coursing into the neck. No acute osseous abnormalities are seen.
IMPRESSION: 1. No acute chest finding.
2. Hyperinflation with increased reticular opacities in the lung
bases and peripherally, suggesting interstitial lung disease or
pulmonary fibrosis.

## 2021-03-31 ENCOUNTER — Other Ambulatory Visit: Payer: Self-pay | Admitting: Family Medicine

## 2021-04-27 ENCOUNTER — Encounter: Payer: Self-pay | Admitting: Family Medicine

## 2021-05-25 ENCOUNTER — Ambulatory Visit: Payer: Medicare Other | Admitting: Family Medicine

## 2021-05-26 ENCOUNTER — Other Ambulatory Visit: Payer: Self-pay | Admitting: Family Medicine

## 2021-06-01 ENCOUNTER — Encounter: Payer: Self-pay | Admitting: Family Medicine

## 2021-06-01 ENCOUNTER — Other Ambulatory Visit: Payer: Self-pay

## 2021-06-01 ENCOUNTER — Ambulatory Visit: Payer: Medicare Other | Admitting: Family Medicine

## 2021-06-01 VITALS — BP 128/67 | HR 80 | Temp 98.0°F | Ht 68.5 in | Wt 166.6 lb

## 2021-06-01 DIAGNOSIS — D692 Other nonthrombocytopenic purpura: Secondary | ICD-10-CM | POA: Insufficient documentation

## 2021-06-01 DIAGNOSIS — I4891 Unspecified atrial fibrillation: Secondary | ICD-10-CM

## 2021-06-01 DIAGNOSIS — I129 Hypertensive chronic kidney disease with stage 1 through stage 4 chronic kidney disease, or unspecified chronic kidney disease: Secondary | ICD-10-CM

## 2021-06-01 DIAGNOSIS — N1831 Chronic kidney disease, stage 3a: Secondary | ICD-10-CM

## 2021-06-01 DIAGNOSIS — Z0000 Encounter for general adult medical examination without abnormal findings: Secondary | ICD-10-CM | POA: Diagnosis not present

## 2021-06-01 DIAGNOSIS — M1A9XX1 Chronic gout, unspecified, with tophus (tophi): Secondary | ICD-10-CM

## 2021-06-01 DIAGNOSIS — E782 Mixed hyperlipidemia: Secondary | ICD-10-CM

## 2021-06-01 DIAGNOSIS — J439 Emphysema, unspecified: Secondary | ICD-10-CM | POA: Diagnosis not present

## 2021-06-01 DIAGNOSIS — N401 Enlarged prostate with lower urinary tract symptoms: Secondary | ICD-10-CM

## 2021-06-01 DIAGNOSIS — N138 Other obstructive and reflux uropathy: Secondary | ICD-10-CM

## 2021-06-01 LAB — MICROALBUMIN, URINE WAIVED
Creatinine, Urine Waived: 50 mg/dL (ref 10–300)
Microalb, Ur Waived: 10 mg/L (ref 0–19)
Microalb/Creat Ratio: <30 mg/g (ref <30)

## 2021-06-01 LAB — URINALYSIS, ROUTINE W REFLEX MICROSCOPIC
Bilirubin, UA: NEGATIVE
Glucose, UA: NEGATIVE
Ketones, UA: NEGATIVE
Leukocytes,UA: NEGATIVE
Nitrite, UA: NEGATIVE
Protein,UA: NEGATIVE
RBC, UA: NEGATIVE
Specific Gravity, UA: 1.02 (ref 1.005–1.030)
Urobilinogen, Ur: 0.2 mg/dL (ref 0.2–1.0)
pH, UA: 6.5 (ref 5.0–7.5)

## 2021-06-01 MED ORDER — FLUTICASONE-SALMETEROL 250-50 MCG/ACT IN AEPB: INHALATION_SPRAY | RESPIRATORY_TRACT | 1 refills | Status: DC

## 2021-06-01 MED ORDER — ALLOPURINOL 100 MG PO TABS: 100.0000 mg | ORAL_TABLET | Freq: Every day | ORAL | 1 refills | Status: DC

## 2021-06-01 MED ORDER — FINASTERIDE 5 MG PO TABS: 5.0000 mg | ORAL_TABLET | Freq: Every day | ORAL | 1 refills | Status: DC

## 2021-06-01 MED ORDER — SIMVASTATIN 20 MG PO TABS: 20.0000 mg | ORAL_TABLET | Freq: Every day | ORAL | 1 refills | Status: DC

## 2021-06-01 MED ORDER — TERAZOSIN HCL 5 MG PO CAPS: 5.0000 mg | ORAL_CAPSULE | Freq: Every day | ORAL | 1 refills | Status: DC

## 2021-06-01 MED ORDER — LOSARTAN POTASSIUM 50 MG PO TABS: 50.0000 mg | ORAL_TABLET | Freq: Every day | ORAL | 1 refills | Status: DC

## 2021-06-01 NOTE — Progress Notes (Signed)
BP 128/67   Pulse 80   Temp 98 F (36.7 C) (Oral)   Ht 5' 8.5" (1.74 m)   Wt 166 lb 9.6 oz (75.6 kg)   SpO2 98%   BMI 24.96 kg/m    Subjective:    Patient ID: Carlos Richard, male    DOB: 1932/08/03, 87 y.o.   MRN: [id number]  HPI: Carlos Richard is a 87 y.o. male presenting on 06/01/2021 for comprehensive medical examination. Current medical complaints include:  HYPERTENSION / HYPERLIPIDEMIA Satisfied with current treatment? yes Duration of hypertension: chronic BP monitoring frequency: rarely BP medication side effects: no Duration of hyperlipidemia: chronic Cholesterol medication side effects: no Cholesterol supplements: none Past cholesterol medications: simvastatin Medication compliance: excellent compliance Aspirin: no Recent stressors: no Recurrent headaches: no Visual changes: no Palpitations: no Dyspnea: no Chest pain: no Lower extremity edema: no Dizzy/lightheaded: no  No gout flares  BPH BPH status: controlled Satisfied with current treatment?: yes Medication side effects: no Medication compliance: excellent compliance Duration: chronic Nocturia: no Urinary frequency:no Incomplete voiding: no Urgency: no Weak urinary stream: no Straining to start stream: no Dysuria: no Onset: gradual Severity: mild  He currently lives with: wife Interim Problems from his last visit: no  Depression Screen done today and results listed below:  Depression screen [facility] 2/9 06/01/2021 02/16/2021 09/16/2020 02/04/2020 01/31/2019  Decreased Interest 0 0 0 0 0  Down, Depressed, Hopeless 0 0 0 0 0  PHQ - 2 Score 0 0 0 0 0  Altered sleeping - - 0 - -  Tired, decreased energy - - 0 - -  Change in appetite - - 0 - -  Feeling bad or failure about yourself  - - 0 - -  Trouble concentrating - - 0 - -  Moving slowly or fidgety/restless - - 0 - -  Suicidal thoughts - - 0 - -  PHQ-9 Score - - 0 - -  Difficult doing work/chores - - Not difficult at all - -    [facility] Screen  06/01/2021 02/16/2021 02/04/2020 01/31/2019  What Year? 0 points 0 points 0 points 0 points  What month? 0 points 0 points 0 points 0 points  What time? 0 points 0 points 0 points 0 points  Count back from 20 0 points 0 points 0 points 0 points  Months in reverse 2 points 4 points 0 points 0 points  Repeat phrase 4 points 6 points 0 points 0 points  Total Score 6 10 0 0     Past Medical History:  Past Medical History:  Diagnosis Date   Atrial fibrillation (HCC)    COPD (chronic obstructive pulmonary disease) (HCC)    Hypertension    Near syncope    Tremors of nervous system     Surgical History:  Past Surgical History:  Procedure Laterality Date   BACK SURGERY     BRAIN SURGERY     stimulator placement for tremors   EYE SURGERY Left 2020   cataract surgery nov    EYE SURGERY Right 2020   cataract surgery dec    Medications:  Current Outpatient Medications on File Prior to Visit  Medication Sig   furosemide (LASIX) 20 MG tablet Take 20 mg by mouth 3 (three) times a week. Monday, Wednesday, Friday   magnesium oxide (MAG-OX) 400 MG tablet Take 400 mg by mouth daily.   metoprolol tartrate (LOPRESSOR) 25 MG tablet Take 12.5 mg by mouth 3 (three) times a week.    Multiple  Vitamins-Minerals (ZINC PO) Take 1 tablet by mouth daily.   UNABLE TO FIND CuraMed   vitamin E 200 UNIT capsule Take 200 Units by mouth daily.   XARELTO 15 MG TABS tablet Take 15 mg by mouth daily with supper.    Zinc 50 MG TABS Take 50 mg by mouth daily.    No current facility-administered medications on file prior to visit.    Allergies:  No Known Allergies  Social History:  Social History   Socioeconomic History   Marital status: Married    Spouse name: Not on file   Number of children: Not on file   Years of education: Not on file   Highest education level: Associate degree: academic program  Occupational History   Not on file  Tobacco Use   Smoking status: Former    Types: Cigarettes     Quit date: 1980    Years since quitting: 42.5   Smokeless tobacco: Never   Tobacco comments:    quit 40 years ago   Vaping Use   Vaping Use: Never used  Substance and Sexual Activity   Alcohol use: Yes    Comment: occasionally   Drug use: No   Sexual activity: Yes  Other Topics Concern   Not on file  Social History Narrative   Not on file   Social Determinants of Health   Financial Resource Strain: Low Risk    Difficulty of Paying Living Expenses: Not hard at all  Food Insecurity: No Food Insecurity   Worried About Charity fundraiser in the Last Year: Never true   [place] in the Last Year: Never true  Transportation Needs: No Transportation Needs   Lack of Transportation (Medical): No   Lack of Transportation (Non-Medical): No  Physical Activity: Insufficiently Active   Days of Exercise per Week: 5 days   Minutes of Exercise per Session: 20 min  Stress: No Stress Concern Present   Feeling of Stress : Not at all  Social Connections: Not on file  Intimate Partner Violence: Not on file   Social History   Tobacco Use  Smoking Status Former   Types: Cigarettes   Quit date: 1980   Years since quitting: 42.5  Smokeless Tobacco Never  Tobacco Comments   quit 40 years ago    Social History   Substance and Sexual Activity  Alcohol Use Yes   Comment: occasionally    Family History:  Family History  Problem Relation Age of Onset   Tuberculosis Mother    Tremor Paternal Grandmother    Obesity Sister    Prostate cancer Neg Hx    Bladder Cancer Neg Hx    Kidney cancer Neg Hx     Past medical history, surgical history, medications, allergies, family history and social history reviewed with patient today and changes made to appropriate areas of the chart.   Review of Systems  Constitutional: Negative.   HENT:  Positive for congestion. Negative for ear discharge, ear pain, hearing loss, nosebleeds, sinus pain, sore throat and tinnitus.   Eyes:  Positive for  blurred vision. Negative for double vision, photophobia, pain, discharge and redness.  Respiratory: Negative.  Negative for stridor.   Cardiovascular: Negative.   Gastrointestinal: Negative.   Genitourinary:  Positive for frequency. Negative for dysuria, flank pain, hematuria and urgency.  Musculoskeletal: Negative.   Skin:  Positive for itching. Negative for rash.  Neurological: Negative.   Endo/Heme/Allergies:  Negative for environmental allergies and polydipsia. Bruises/bleeds easily.  Psychiatric/Behavioral:  Positive for memory loss. Negative for depression, hallucinations, substance abuse and suicidal ideas. The patient is not nervous/anxious and does not have insomnia.   All other ROS negative except what is listed above and in the HPI.      Objective:    BP 128/67   Pulse 80   Temp 98 F (36.7 C) (Oral)   Ht 5' 8.5" (1.74 m)   Wt 166 lb 9.6 oz (75.6 kg)   SpO2 98%   BMI 24.96 kg/m   Wt Readings from Last 3 Encounters:  06/01/21 166 lb 9.6 oz (75.6 kg)  02/16/21 158 lb (71.7 kg)  11/17/20 167 lb 9.6 oz (76 kg)    Physical Exam Vitals and nursing note reviewed.  Constitutional:      General: He is not in acute distress.    Appearance: Normal appearance. He is normal weight. He is not ill-appearing, toxic-appearing or diaphoretic.  HENT:     Head: Normocephalic and atraumatic.     Right Ear: Tympanic membrane, ear canal and external ear normal. There is no impacted cerumen.     Left Ear: Tympanic membrane, ear canal and external ear normal. There is no impacted cerumen.     Nose: Nose normal. No congestion or rhinorrhea.     Mouth/Throat:     Mouth: Mucous membranes are moist.     Pharynx: Oropharynx is clear. No oropharyngeal exudate or posterior oropharyngeal erythema.  Eyes:     General: No scleral icterus.       Right eye: No discharge.        Left eye: No discharge.     Extraocular Movements: Extraocular movements intact.     Conjunctiva/sclera: Conjunctivae  normal.     Pupils: Pupils are equal, round, and reactive to light.  Neck:     Vascular: No carotid bruit.  Cardiovascular:     Rate and Rhythm: Normal rate and regular rhythm.     Pulses: Normal pulses.     Heart sounds: No murmur heard.   No friction rub. No gallop.  Pulmonary:     Effort: Pulmonary effort is normal. No respiratory distress.     Breath sounds: Normal breath sounds. No stridor. No wheezing, rhonchi or rales.  Chest:     Chest wall: No tenderness.  Abdominal:     General: Abdomen is flat. Bowel sounds are normal. There is no distension.     Palpations: Abdomen is soft. There is no mass.     Tenderness: There is no abdominal tenderness. There is no right CVA tenderness, left CVA tenderness, guarding or rebound.     Hernia: No hernia is present.  Genitourinary:    Comments: Genital exam deferred with shared decision making Musculoskeletal:        General: No swelling, tenderness, deformity or signs of injury.     Cervical back: Normal range of motion and neck supple. No rigidity. No muscular tenderness.     Right lower leg: No edema.     Left lower leg: No edema.  Lymphadenopathy:     Cervical: No cervical adenopathy.  Skin:    General: Skin is warm and dry.     Capillary Refill: Capillary refill takes less than 2 seconds.     Coloration: Skin is not jaundiced or pale.     Findings: No bruising, erythema, lesion or rash.  Neurological:     General: No focal deficit present.     Mental Status: He is alert and oriented  to person, place, and time.     Cranial Nerves: No cranial nerve deficit.     Sensory: No sensory deficit.     Motor: No weakness.     Coordination: Coordination normal.     Gait: Gait normal.     Deep Tendon Reflexes: Reflexes normal.  Psychiatric:        Mood and Affect: Mood normal.        Behavior: Behavior normal.        Thought Content: Thought content normal.        Judgment: Judgment normal.    Results for orders placed or performed  in visit on 06/01/21  Comprehensive metabolic panel  Result Value Ref Range   Glucose 93 65 - 99 mg/dL   BUN 10 8 - 27 mg/dL   Creatinine, Ser 1.32 (H) 0.76 - 1.27 mg/dL   eGFR 52 (L) >59 mL/min/1.73   BUN/Creatinine Ratio 8 (L) 10 - 24   Sodium 144 134 - 144 mmol/L   Potassium 4.5 3.5 - 5.2 mmol/L   Chloride 105 96 - 106 mmol/L   CO2 25 20 - 29 mmol/L   Calcium 9.6 8.6 - 10.2 mg/dL   Total Protein 6.9 6.0 - 8.5 g/dL   Albumin 4.5 3.6 - 4.6 g/dL   Globulin, Total 2.4 1.5 - 4.5 g/dL   Albumin/Globulin Ratio 1.9 1.2 - 2.2   Bilirubin Total 0.6 0.0 - 1.2 mg/dL   Alkaline Phosphatase 72 44 - 121 IU/L   AST 29 0 - 40 IU/L   ALT 22 0 - 44 IU/L  CBC with Differential/Platelet  Result Value Ref Range   WBC 9.1 3.4 - 10.8 x10E3/uL   RBC 4.80 4.14 - 5.80 x10E6/uL   Hemoglobin 14.5 13.0 - 17.7 g/dL   Hematocrit 43.5 37.5 - 51.0 %   MCV 91 79 - 97 fL   MCH 30.2 26.6 - 33.0 pg   MCHC 33.3 31.5 - 35.7 g/dL   RDW 13.5 11.6 - 15.4 %   Platelets 202 150 - 450 x10E3/uL   Neutrophils 55 Not Estab. %   Lymphs 34 Not Estab. %   Monocytes 8 Not Estab. %   Eos 2 Not Estab. %   Basos 1 Not Estab. %   Neutrophils Absolute 5.0 1.4 - 7.0 x10E3/uL   Lymphocytes Absolute 3.1 0.7 - 3.1 x10E3/uL   Monocytes Absolute 0.7 0.1 - 0.9 x10E3/uL   EOS (ABSOLUTE) 0.2 0.0 - 0.4 x10E3/uL   Basophils Absolute 0.1 0.0 - 0.2 x10E3/uL   Immature Granulocytes 0 Not Estab. %   Immature Grans (Abs) 0.0 0.0 - 0.1 x10E3/uL  Lipid Panel w/o Chol/HDL Ratio  Result Value Ref Range   Cholesterol, Total 150 100 - 199 mg/dL   Triglycerides 234 (H) 0 - 149 mg/dL   HDL 45 >39 mg/dL   VLDL Cholesterol Cal 38 5 - 40 mg/dL   LDL Chol Calc (NIH) 67 0 - 99 mg/dL  TSH  Result Value Ref Range   TSH 1.230 0.450 - 4.500 uIU/mL  Urinalysis, Routine w reflex microscopic  Result Value Ref Range   Specific Gravity, UA 1.020 1.005 - 1.030   pH, UA 6.5 5.0 - 7.5   Color, UA Yellow Yellow   Appearance Ur Clear Clear    Leukocytes,UA Negative Negative   Protein,UA Negative Negative/Trace   Glucose, UA Negative Negative   Ketones, UA Negative Negative   RBC, UA Negative Negative   Bilirubin, UA Negative Negative   Urobilinogen,  Ur 0.2 0.2 - 1.0 mg/dL   Nitrite, UA Negative Negative  Microalbumin, Urine Waived  Result Value Ref Range   Microalb, Ur Waived 10 0 - 19 mg/L   Creatinine, Urine Waived 50 10 - 300 mg/dL   Microalb/Creat Ratio <30 <30 mg/g  Uric acid  Result Value Ref Range   Uric Acid 6.2 3.8 - 8.4 mg/dL      Assessment & Plan:   Problem List Items Addressed This Visit       Cardiovascular and Mediastinum   Atrial fibrillation (HCC)    Under good control on current regimen. Continue current regimen. Continue to monitor. Call with any concerns. Refills given. Labs drawn today.         Relevant Medications   losartan (COZAAR) 50 MG tablet   simvastatin (ZOCOR) 20 MG tablet   terazosin (HYTRIN) 5 MG capsule   Other Relevant Orders   Comprehensive metabolic panel (Completed)   CBC with Differential/Platelet (Completed)   TSH (Completed)   Senile purpura (HCC)    Reassured patient. Call with any concerns.        Relevant Medications   losartan (COZAAR) 50 MG tablet   simvastatin (ZOCOR) 20 MG tablet   terazosin (HYTRIN) 5 MG capsule     Respiratory   COPD (chronic obstructive pulmonary disease) (HCC)    Under good control on current regimen. Continue current regimen. Continue to monitor. Call with any concerns. Refills given. Labs drawn today.         Relevant Medications   fluticasone-salmeterol (ADVAIR DISKUS) 250-50 MCG/ACT AEPB   Other Relevant Orders   Comprehensive metabolic panel (Completed)     Genitourinary   BPH with obstruction/lower urinary tract symptoms    Under good control on current regimen. Continue current regimen. Continue to monitor. Call with any concerns. Refills given. Labs drawn today.         Relevant Medications   finasteride  (PROSCAR) 5 MG tablet   Other Relevant Orders   Comprehensive metabolic panel (Completed)   Urinalysis, Routine w reflex microscopic (Completed)   Benign hypertensive renal disease    Under good control on current regimen. Continue current regimen. Continue to monitor. Call with any concerns. Refills given. Labs drawn today.         Relevant Orders   Comprehensive metabolic panel (Completed)   Microalbumin, Urine Waived (Completed)   CKD (chronic kidney disease) stage 3, GFR 30-59 ml/min    Stable. Continue current regimen. Continue to monitor.        Relevant Orders   Comprehensive metabolic panel (Completed)     Other   Gout    Under good control on current regimen. Continue current regimen. Continue to monitor. Call with any concerns. Refills given. Labs drawn today.         Relevant Orders   Comprehensive metabolic panel (Completed)   Uric acid (Completed)   Mixed hyperlipidemia    Under good control on current regimen. Continue current regimen. Continue to monitor. Call with any concerns. Refills given. Labs drawn today.         Relevant Medications   losartan (COZAAR) 50 MG tablet   simvastatin (ZOCOR) 20 MG tablet   terazosin (HYTRIN) 5 MG capsule   Other Relevant Orders   Comprehensive metabolic panel (Completed)   Lipid Panel w/o Chol/HDL Ratio (Completed)   Other Visit Diagnoses     Routine general medical examination at a health care facility    -  Primary  Vaccines up to date. Screening labs checked today. Continue diet and exercise. Call with any concerns.         LABORATORY TESTING:  Health maintenance labs ordered today as discussed above.   IMMUNIZATIONS:   - Tdap: Tetanus vaccination status reviewed: last tetanus booster within 10 years. - Influenza: Postponed to flu season - Pneumovax: Administered today - Prevnar: Up to date - HPV: Up to date - Zostavax vaccine: Not applicable  SCREENING: - Colonoscopy: Not applicable  Discussed  with patient purpose of the colonoscopy is to detect colon cancer at curable precancerous or early stages   PATIENT COUNSELING:    Sexuality: Discussed sexually transmitted diseases, partner selection, use of condoms, avoidance of unintended pregnancy  and contraceptive alternatives.   Advised to avoid cigarette smoking.  I discussed with the patient that most people either abstain from alcohol or drink within safe limits (<=14/week and <=4 drinks/occasion for males, <=7/weeks and <= 3 drinks/occasion for females) and that the risk for alcohol disorders and other health effects rises proportionally with the number of drinks per week and how often a drinker exceeds daily limits.  Discussed cessation/primary prevention of drug use and availability of treatment for abuse.   Diet: Encouraged to adjust caloric intake to maintain  or achieve ideal body weight, to reduce intake of dietary saturated fat and total fat, to limit sodium intake by avoiding high sodium foods and not adding table salt, and to maintain adequate dietary potassium and calcium preferably from fresh fruits, vegetables, and low-fat dairy products.    stressed the importance of regular exercise  Injury prevention: Discussed safety belts, safety helmets, smoke detector, smoking near bedding or upholstery.   Dental health: Discussed importance of regular tooth brushing, flossing, and dental visits.   Follow up plan: NEXT PREVENTATIVE PHYSICAL DUE IN 1 YEAR. Return in about 6 months (around 12/02/2021).

## 2021-06-02 LAB — COMPREHENSIVE METABOLIC PANEL
ALT: 22 IU/L (ref 0–44)
AST: 29 IU/L (ref 0–40)
Albumin/Globulin Ratio: 1.9 (ref 1.2–2.2)
Albumin: 4.5 g/dL (ref 3.6–4.6)
Alkaline Phosphatase: 72 IU/L (ref 44–121)
BUN/Creatinine Ratio: 8 — ABNORMAL LOW (ref 10–24)
BUN: 10 mg/dL (ref 8–27)
Bilirubin Total: 0.6 mg/dL (ref 0.0–1.2)
CO2: 25 mmol/L (ref 20–29)
Calcium: 9.6 mg/dL (ref 8.6–10.2)
Chloride: 105 mmol/L (ref 96–106)
Creatinine, Ser: 1.32 mg/dL — ABNORMAL HIGH (ref 0.76–1.27)
Globulin, Total: 2.4 g/dL (ref 1.5–4.5)
Glucose: 93 mg/dL (ref 65–99)
Potassium: 4.5 mmol/L (ref 3.5–5.2)
Sodium: 144 mmol/L (ref 134–144)
Total Protein: 6.9 g/dL (ref 6.0–8.5)
eGFR: 52 mL/min/{1.73_m2} — ABNORMAL LOW (ref >59)

## 2021-06-02 LAB — CBC WITH DIFFERENTIAL/PLATELET
Basophils Absolute: 0.1 10*3/uL (ref 0.0–0.2)
Basos: 1 %
EOS (ABSOLUTE): 0.2 10*3/uL (ref 0.0–0.4)
Eos: 2 %
Hematocrit: 43.5 % (ref 37.5–51.0)
Hemoglobin: 14.5 g/dL (ref 13.0–17.7)
Immature Grans (Abs): 0 10*3/uL (ref 0.0–0.1)
Immature Granulocytes: 0 %
Lymphocytes Absolute: 3.1 10*3/uL (ref 0.7–3.1)
Lymphs: 34 %
MCH: 30.2 pg (ref 26.6–33.0)
MCHC: 33.3 g/dL (ref 31.5–35.7)
MCV: 91 fL (ref 79–97)
Monocytes Absolute: 0.7 10*3/uL (ref 0.1–0.9)
Monocytes: 8 %
Neutrophils Absolute: 5 10*3/uL (ref 1.4–7.0)
Neutrophils: 55 %
Platelets: 202 10*3/uL (ref 150–450)
RBC: 4.8 x10E6/uL (ref 4.14–5.80)
RDW: 13.5 % (ref 11.6–15.4)
WBC: 9.1 10*3/uL (ref 3.4–10.8)

## 2021-06-02 LAB — LIPID PANEL W/O CHOL/HDL RATIO
Cholesterol, Total: 150 mg/dL (ref 100–199)
HDL: 45 mg/dL (ref >39)
LDL Chol Calc (NIH): 67 mg/dL (ref 0–99)
Triglycerides: 234 mg/dL — ABNORMAL HIGH (ref 0–149)
VLDL Cholesterol Cal: 38 mg/dL (ref 5–40)

## 2021-06-02 LAB — TSH: TSH: 1.23 u[IU]/mL (ref 0.450–4.500)

## 2021-06-02 LAB — URIC ACID: Uric Acid: 6.2 mg/dL (ref 3.8–8.4)

## 2021-06-02 NOTE — Assessment & Plan Note (Signed)
Under good control on current regimen. Continue current regimen. Continue to monitor. Call with any concerns. Refills given. Labs drawn today.   

## 2021-06-02 NOTE — Assessment & Plan Note (Signed)
Stable. Continue current regimen. Continue to monitor.  

## 2021-06-02 NOTE — Assessment & Plan Note (Signed)
Reassured patient. Call with any concerns.  

## 2021-06-06 ENCOUNTER — Other Ambulatory Visit: Payer: Self-pay | Admitting: Family Medicine

## 2021-06-06 NOTE — Telephone Encounter (Signed)
Med already picked up

## 2021-06-08 ENCOUNTER — Ambulatory Visit (INDEPENDENT_AMBULATORY_CARE_PROVIDER_SITE_OTHER): Payer: Medicare Other

## 2021-06-08 ENCOUNTER — Other Ambulatory Visit: Payer: Self-pay

## 2021-06-08 DIAGNOSIS — Z23 Encounter for immunization: Secondary | ICD-10-CM | POA: Diagnosis not present

## 2021-08-07 DIAGNOSIS — I951 Orthostatic hypotension: Secondary | ICD-10-CM | POA: Insufficient documentation

## 2021-12-02 ENCOUNTER — Ambulatory Visit: Payer: Medicare Other | Admitting: Family Medicine

## 2022-01-14 ENCOUNTER — Other Ambulatory Visit: Payer: Self-pay | Admitting: Family Medicine

## 2022-01-14 NOTE — Telephone Encounter (Signed)
Requested Prescriptions  ?Pending Prescriptions Disp Refills  ?? simvastatin (ZOCOR) 20 MG tablet [Pharmacy Med Name: SIMVASTATIN 20 MG TABLET] 90 tablet 0  ?  Sig: Take 1 tablet (20 mg total) by mouth daily.  ?  ? Cardiovascular:  Antilipid - Statins Failed - 01/14/2022 10:17 AM  ?  ?  Failed - Lipid Panel in normal range within the last 12 months  ?  Cholesterol, Total  ?Date Value Ref Range Status  ?06/01/2021 150 100 - 199 mg/dL Final  ? ?LDL Chol Calc (NIH)  ?Date Value Ref Range Status  ?06/01/2021 67 0 - 99 mg/dL Final  ? ?HDL  ?Date Value Ref Range Status  ?06/01/2021 45 >39 mg/dL Final  ? ?Triglycerides  ?Date Value Ref Range Status  ?06/01/2021 234 (H) 0 - 149 mg/dL Final  ? ?  ?  ?  Passed - Patient is not pregnant  ?  ?  Passed - Valid encounter within last 12 months  ?  Recent Outpatient Visits   ?      ? 7 months ago Routine general medical examination at a health care facility  ? [facility] [place], Megan P, DO  ? 1 year ago Balance problem  ? [facility] [place], Megan P, DO  ? 1 year ago Balance problem  ? [facility] [place], [place] P, DO  ? 1 year ago Benign hypertensive renal disease  ? [facility] [place], [place] P, DO  ? 2 years ago Routine general medical examination at a health care facility  ? [facility] [place], [place] P, DO  ?  ?  ?Future Appointments   ?        ? In 1 month [facility]   ?  ? ?  ?  ?  ? ? ?

## 2022-02-17 ENCOUNTER — Ambulatory Visit (INDEPENDENT_AMBULATORY_CARE_PROVIDER_SITE_OTHER): Payer: Medicare Other | Admitting: *Deleted

## 2022-02-17 VITALS — BP 135/74 | HR 76 | Temp 98.0°F | Wt 153.4 lb

## 2022-02-17 DIAGNOSIS — Z0000 Encounter for general adult medical examination without abnormal findings: Secondary | ICD-10-CM

## 2022-02-17 NOTE — Patient Instructions (Signed)
[name] , ?Thank you for taking time to come for your Medicare Wellness Visit. I appreciate your ongoing commitment to your health goals. Please review the following plan we discussed and let me know if I can assist you in the future.  ? ?Screening recommendations/referrals: ?Colonoscopy: no longer required ?Recommended yearly ophthalmology/optometry visit for glaucoma screening and checkup ?Recommended yearly dental visit for hygiene and checkup ? ?Vaccinations: ?Influenza vaccine: up to date ?Pneumococcal vaccine: up to date ?Tdap vaccine: up to date ?Shingles vaccine: up to date   ? ?Advanced directives: Richard provided ? ?Conditions/risks identified:  ? ? ? ?Preventive Care 5 Years and Older, Male ?Preventive care refers to lifestyle choices and visits with your health care provider that can promote health and wellness. ?What does preventive care include? ?A yearly physical exam. This is also called an annual well check. ?Dental exams once or twice a year. ?Routine eye exams. Ask your health care provider how often you should have your eyes checked. ?Personal lifestyle choices, including: ?Daily care of your teeth and gums. ?Regular physical activity. ?Eating a healthy diet. ?Avoiding tobacco and drug use. ?Limiting alcohol use. ?Practicing safe sex. ?Taking low doses of aspirin every day. ?Taking vitamin and mineral supplements as recommended by your health care provider. ?What happens during an annual well check? ?The services and screenings done by your health care provider during your annual well check will depend on your age, overall health, lifestyle risk factors, and family history of disease. ?Counseling  ?Your health care provider may ask you questions about your: ?Alcohol use. ?Tobacco use. ?Drug use. ?Emotional well-being. ?Home and relationship well-being. ?Sexual activity. ?Eating habits. ?History of falls. ?Memory and ability to understand (cognition). ?Work and work Astronomer. ?Screening   ?You may have the following tests or measurements: ?Height, weight, and BMI. ?Blood pressure. ?Lipid and cholesterol levels. These may be checked every 5 years, or more frequently if you are over 24 years old. ?Skin check. ?Lung cancer screening. You may have this screening every year starting at age 80 if you have a 30-pack-year history of smoking and currently smoke or have quit within the past 15 years. ?Fecal occult blood test (FOBT) of the stool. You may have this test every year starting at age 69. ?Flexible sigmoidoscopy or colonoscopy. You may have a sigmoidoscopy every 5 years or a colonoscopy every 10 years starting at age 72. ?Prostate cancer screening. Recommendations will vary depending on your family history and other risks. ?Hepatitis C blood test. ?Hepatitis B blood test. ?Sexually transmitted disease (STD) testing. ?Diabetes screening. This is done by checking your blood sugar (glucose) after you have not eaten for a while (fasting). You may have this done every 1-3 years. ?Abdominal aortic aneurysm (AAA) screening. You may need this if you are a current or former smoker. ?Osteoporosis. You may be screened starting at age 51 if you are at high risk. ?Talk with your health care provider about your test results, treatment options, and if necessary, the need for more tests. ?Vaccines  ?Your health care provider may recommend certain vaccines, such as: ?Influenza vaccine. This is recommended every year. ?Tetanus, diphtheria, and acellular pertussis (Tdap, Td) vaccine. You may need a Td booster every 10 years. ?Zoster vaccine. You may need this after age 73. ?Pneumococcal 13-valent conjugate (PCV13) vaccine. One dose is recommended after age 67. ?Pneumococcal polysaccharide (PPSV23) vaccine. One dose is recommended after age 31. ?Talk to your health care provider about which screenings and vaccines you need and how  often you need them. ?This information is not intended to replace advice given to you by  your health care provider. Make sure you discuss any questions you have with your health care provider. ?Document Released: 11/28/2015 Document Revised: 07/21/2016 Document Reviewed: 09/02/2015 ?Carlos Richard ? 2017 Carlos Richard. ? ?Fall Prevention in the Home ?Falls can cause injuries. They can happen to people of all ages. There are many things you can do to make your home safe and to help prevent falls. ?What can I do on the outside of my home? ?Regularly fix the edges of walkways and driveways and fix any cracks. ?Remove anything that might make you trip as you walk through a door, such as a raised step or threshold. ?Trim any bushes or trees on the path to your home. ?Use bright outdoor lighting. ?Clear any walking paths of anything that might make someone trip, such as rocks or tools. ?Regularly check to see if handrails are loose or broken. Make sure that both sides of any steps have handrails. ?Any raised decks and porches should have guardrails on the edges. ?Have any leaves, snow, or ice cleared regularly. ?Use sand or salt on walking paths during winter. ?Clean up any spills in your garage right away. This includes oil or grease spills. ?What can I do in the bathroom? ?Use night lights. ?Install grab bars by the toilet and in the tub and shower. Do not use towel bars as grab bars. ?Use non-skid mats or decals in the tub or shower. ?If you need to sit down in the shower, use a plastic, non-slip stool. ?Keep the floor dry. Clean up any water that spills on the floor as soon as it happens. ?Remove soap buildup in the tub or shower regularly. ?Attach bath mats securely with double-sided non-slip rug tape. ?Do not have throw rugs and other things on the floor that can make you trip. ?What can I do in the bedroom? ?Use night lights. ?Make sure that you have a light by your bed that is easy to reach. ?Do not use any sheets or blankets that are too big for your bed. They should not hang  down onto the floor. ?Have a firm chair that has side arms. You can use this for support while you get dressed. ?Do not have throw rugs and other things on the floor that can make you trip. ?What can I do in the kitchen? ?Clean up any spills right away. ?Avoid walking on wet floors. ?Keep items that you use a lot in easy-to-reach places. ?If you need to reach something above you, use a strong step stool that has a grab bar. ?Keep electrical cords out of the way. ?Do not use floor polish or wax that makes floors slippery. If you must use wax, use non-skid floor wax. ?Do not have throw rugs and other things on the floor that can make you trip. ?What can I do with my stairs? ?Do not leave any items on the stairs. ?Make sure that there are handrails on both sides of the stairs and use them. Fix handrails that are broken or loose. Make sure that handrails are as long as the stairways. ?Check any carpeting to make sure that it is firmly attached to the stairs. Fix any carpet that is loose or worn. ?Avoid having throw rugs at the top or bottom of the stairs. If you do have throw rugs, attach them to the floor with carpet tape. ?Make sure that you have a  light switch at the top of the stairs and the bottom of the stairs. If you do not have them, ask someone to add them for you. ?What else can I do to help prevent falls? ?Wear shoes that: ?Do not have high heels. ?Have rubber bottoms. ?Are comfortable and fit you well. ?Are closed at the toe. Do not wear sandals. ?If you use a stepladder: ?Make sure that it is fully opened. Do not climb a closed stepladder. ?Make sure that both sides of the stepladder are locked into place. ?Ask someone to hold it for you, if possible. ?Clearly mark and make sure that you can see: ?Any grab bars or handrails. ?First and last steps. ?Where the edge of each step is. ?Use tools that help you move around (mobility aids) if they are needed. These  include: ?Canes. ?Walkers. ?Scooters. ?Crutches. ?Turn on the lights when you go into a dark area. Replace any light bulbs as soon as they burn out. ?Set up your furniture so you have a clear path. Avoid moving your furniture around. ?If any of your

## 2022-02-17 NOTE — Progress Notes (Signed)
? ?Subjective:  ? Carlos Richard is a 88 y.o. male who presents for Medicare Annual/Subsequent preventive examination. ? ?I connected with  Carlos Richard on 02/17/22 by a telephone enabled telemedicine application and verified that I am speaking with the correct person using two identifiers. ?  ?I discussed the limitations of evaluation and management by telemedicine. The patient expressed understanding and agreed to proceed. ? ?Patient location: home ? ?Provider location:  Tele-health not in office ? ? ? ?Review of Systems    ? ?Cardiac Risk Factors include: advanced age (>53men, >25 women);male gender;sedentary lifestyle ? ?   ?Objective:  ?  ?Today's Vitals  ? 02/17/22 1013  ?BP: 135/74  ?Pulse: 76  ?Temp: 98 ?F (36.7 ?C)  ?Weight: 153 lb 6.4 oz (69.6 kg)  ?PainSc: 5   ? ?Body mass index is 22.99 kg/m?. ? ? ?  02/17/2022  ? 10:17 AM 02/16/2021  ?  2:35 PM 04/22/2020  ?  9:38 PM 02/04/2020  ?  9:56 AM 10/31/2019  ? 10:02 AM 01/31/2019  ?  8:47 AM 07/03/2015  ?  7:34 AM  ?Advanced Directives  ?Does Patient Have a Medical Advance Directive? No No No No No No No  ?Would patient like information on creating a medical advance directive? No - Patient declined    No - Patient declined No - Patient declined No - patient declined information  ? ? ?Current Medications (verified) ?Outpatient Encounter Medications as of 02/17/2022  ?Medication Sig  ? allopurinol (ZYLOPRIM) 100 MG tablet Take 1 tablet (100 mg total) by mouth daily. Monday, Wednesday, Friday  ? finasteride (PROSCAR) 5 MG tablet Take 1 tablet (5 mg total) by mouth daily.  ? fluticasone-salmeterol (ADVAIR DISKUS) 250-50 MCG/ACT AEPB INHALE 1 PUFF INTO THE LUNGS TWICE A DAY  ? furosemide (LASIX) 20 MG tablet Take 20 mg by mouth 3 (three) times a week. Monday, Wednesday, Friday  ? magnesium oxide (MAG-OX) 400 MG tablet Take 400 mg by mouth daily.  ? metoprolol tartrate (LOPRESSOR) 25 MG tablet Take 12.5 mg by mouth 3 (three) times a week.   ? Multiple Vitamins-Minerals  (ZINC PO) Take 1 tablet by mouth daily.  ? simvastatin (ZOCOR) 20 MG tablet Take 1 tablet (20 mg total) by mouth daily.  ? terazosin (HYTRIN) 5 MG capsule Take 1 capsule (5 mg total) by mouth at bedtime.  ? UNABLE TO FIND CuraMed  ? vitamin E 200 UNIT capsule Take 200 Units by mouth daily.  ? XARELTO 15 MG TABS tablet Take 15 mg by mouth daily with supper.   ? Zinc 50 MG TABS Take 50 mg by mouth daily.   ? losartan (COZAAR) 50 MG tablet Take 1 tablet (50 mg total) by mouth daily. (Patient not taking: Reported on 02/17/2022)  ? ?No facility-administered encounter medications on file as of 02/17/2022.  ? ? ?Allergies (verified) ?Patient has no known allergies.  ? ?History: ?Past Medical History:  ?Diagnosis Date  ? Atrial fibrillation (HCC)   ? COPD (chronic obstructive pulmonary disease) (HCC)   ? Hypertension   ? Near syncope   ? Tremors of nervous system   ? ?Past Surgical History:  ?Procedure Laterality Date  ? BACK SURGERY    ? BRAIN SURGERY    ? stimulator placement for tremors  ? EYE SURGERY Left 2020  ? cataract surgery nov   ? EYE SURGERY Right 2020  ? cataract surgery dec  ? ?Family History  ?Problem Relation Age of Onset  ? Tuberculosis  Mother   ? Tremor Paternal Grandmother   ? Obesity Sister   ? Prostate cancer Neg Hx   ? Bladder Cancer Neg Hx   ? Kidney cancer Neg Hx   ? ?Social History  ? ?Socioeconomic History  ? Marital status: Married  ?  Spouse name: Not on file  ? Number of children: Not on file  ? Years of education: Not on file  ? Highest education level: Associate degree: academic program  ?Occupational History  ? Not on file  ?Tobacco Use  ? Smoking status: Former  ?  Types: Cigarettes  ?  Quit date: 691980  ?  Years since quitting: 43.2  ? Smokeless tobacco: Never  ? Tobacco comments:  ?  quit 40 years ago   ?Vaping Use  ? Vaping Use: Never used  ?Substance and Sexual Activity  ? Alcohol use: Yes  ?  Comment: occasionally  ? Drug use: No  ? Sexual activity: Yes  ?Other Topics Concern  ? Not on file   ?Social History Narrative  ? Not on file  ? ?Social Determinants of Health  ? ?Financial Resource Strain: Low Risk   ? Difficulty of Paying Living Expenses: Not hard at all  ?Food Insecurity: No Food Insecurity  ? Worried About Programme researcher, broadcasting/film/videounning Out of Food in the Last Year: Never true  ? Ran Out of Food in the Last Year: Never true  ?Transportation Needs: Unknown  ? Lack of Transportation (Medical): No  ? Lack of Transportation (Non-Medical): Not on file  ?Physical Activity: Inactive  ? Days of Exercise per Week: 0 days  ? Minutes of Exercise per Session: 0 min  ?Stress: No Stress Concern Present  ? Feeling of Stress : Not at all  ?Social Connections: Moderately Integrated  ? Frequency of Communication with Friends and Family: More than three times a week  ? Frequency of Social Gatherings with Friends and Family: Three times a week  ? Attends Religious Services: More than 4 times per year  ? Active Member of Clubs or Organizations: No  ? Attends BankerClub or Organization Meetings: Never  ? Marital Status: Married  ? ? ?Tobacco Counseling ?Counseling given: Not Answered ?Tobacco comments: quit 40 years ago  ? ? ?Clinical Intake: ? ?Pre-visit preparation completed: Yes ? ?Pain : 0-10 ?Pain Score: 5  ?Pain Type: Chronic pain ?Pain Location: Hip (shoulder pain also from surgery) ?Pain Orientation: Left ?Pain Descriptors / Indicators: Burning, Aching ?Pain Onset: More than a month ago ?Pain Frequency: Intermittent ?Effect of Pain on Daily Activities: tylenol ? ?  ? ?Nutritional Risks: None ?Diabetes: No ? ?How often do you need to have someone help you when you read instructions, pamphlets, or other written materials from your doctor or pharmacy?: 1 - Never ? ?Diabetic?  no ? ?Interpreter Needed?: No ? ?Information entered by :: Remi HaggardJulie Aj Crunkleton LPN ? ? ?Activities of Daily Living ? ?  02/17/2022  ? 10:21 AM 06/01/2021  ?  3:09 PM  ?In your present state of health, do you have any difficulty performing the following activities:  ?Hearing? 0  0  ?Vision? 0 0  ?Difficulty concentrating or making decisions? 0 1  ?Walking or climbing stairs? 0 0  ?Dressing or bathing? 0 0  ?Doing errands, shopping? 1 0  ?Preparing Food and eating ? N   ?Using the Toilet? N   ?In the past six months, have you accidently leaked urine? N   ?Do you have problems with loss of bowel control? N   ?  Managing your Medications? N   ?Managing your Finances? N   ?Housekeeping or managing your Housekeeping? N   ? ? ?Patient Care Team: ?Dorcas Carrow, DO as PCP - General (Family Medicine) ? ?Indicate any recent Medical Services you may have received from other than [facility] providers in the past year (date may be approximate). ? ?   ?Assessment:  ? This is a routine wellness examination for [facility]. ? ?Hearing/Vision screen ?Vision Screening - Comments:: Up to date ?[facility] ? ?Dietary issues and exercise activities discussed: ?Current Exercise Habits: The patient does not participate in regular exercise at present ? ? Goals Addressed   ? ?  ?  ?  ?  ? This Visit's Progress  ?  Patient Stated     ?  No goals ?  ? ?  ? ?Depression Screen ? ?  02/17/2022  ? 10:24 AM 06/01/2021  ?  3:09 PM 02/16/2021  ?  2:36 PM 09/16/2020  ?  2:20 PM 02/04/2020  ?  9:57 AM 01/31/2019  ?  8:40 AM 08/03/2018  ?  9:21 AM  ?PHQ 2/9 Scores  ?PHQ - 2 Score 0 0 0 0 0 0 0  ?PHQ- 9 Score    0     ?  ?Fall Risk ? ?  02/17/2022  ? 10:17 AM 06/01/2021  ?  3:09 PM 02/16/2021  ?  2:36 PM 09/16/2020  ?  2:20 PM 02/04/2020  ?  9:52 AM  ?Fall Risk   ?Falls in the past year? 0 0 0 0 0  ?Number falls in past yr: 0 0   0  ?Injury with Fall? 0 0   0  ?Risk for fall due to : Impaired balance/gait No Fall Risks Medication side effect    ?Follow up Falls evaluation completed;Education provided;Falls prevention discussed Falls evaluation completed Falls evaluation completed;Education provided;Falls prevention discussed Falls evaluation completed   ? ? ?FALL RISK PREVENTION PERTAINING TO THE HOME: ? ?Any stairs in or around the home? Yes   ?If so, are there any without handrails? No  ?Home free of loose throw rugs in walkways, pet beds, electrical cords, etc? No  ?Adequate lighting in your home to reduce risk of falls? No  ? ?ASSISTIVE D

## 2022-02-19 ENCOUNTER — Ambulatory Visit: Payer: Medicare Other

## 2022-03-05 ENCOUNTER — Ambulatory Visit: Payer: Self-pay

## 2022-03-05 ENCOUNTER — Ambulatory Visit: Payer: Medicare Other | Admitting: Family Medicine

## 2022-03-05 ENCOUNTER — Encounter: Payer: Self-pay | Admitting: Family Medicine

## 2022-03-05 VITALS — BP 137/66 | HR 69 | Temp 97.7°F | Wt 159.0 lb

## 2022-03-05 DIAGNOSIS — E782 Mixed hyperlipidemia: Secondary | ICD-10-CM | POA: Diagnosis not present

## 2022-03-05 DIAGNOSIS — M1A9XX1 Chronic gout, unspecified, with tophus (tophi): Secondary | ICD-10-CM

## 2022-03-05 DIAGNOSIS — R000 Tachycardia, unspecified: Secondary | ICD-10-CM

## 2022-03-05 DIAGNOSIS — D692 Other nonthrombocytopenic purpura: Secondary | ICD-10-CM

## 2022-03-05 DIAGNOSIS — I4891 Unspecified atrial fibrillation: Secondary | ICD-10-CM

## 2022-03-05 DIAGNOSIS — I129 Hypertensive chronic kidney disease with stage 1 through stage 4 chronic kidney disease, or unspecified chronic kidney disease: Secondary | ICD-10-CM

## 2022-03-05 DIAGNOSIS — J439 Emphysema, unspecified: Secondary | ICD-10-CM

## 2022-03-05 DIAGNOSIS — N1831 Chronic kidney disease, stage 3a: Secondary | ICD-10-CM

## 2022-03-05 LAB — URINALYSIS, ROUTINE W REFLEX MICROSCOPIC
Bilirubin, UA: NEGATIVE
Glucose, UA: NEGATIVE
Ketones, UA: NEGATIVE
Leukocytes,UA: NEGATIVE
Nitrite, UA: NEGATIVE
Protein,UA: NEGATIVE
RBC, UA: NEGATIVE
Specific Gravity, UA: 1.02 (ref 1.005–1.030)
Urobilinogen, Ur: 0.2 mg/dL (ref 0.2–1.0)
pH, UA: 5.5 (ref 5.0–7.5)

## 2022-03-05 MED ORDER — SIMVASTATIN 20 MG PO TABS: 20.0000 mg | ORAL_TABLET | Freq: Every day | ORAL | 1 refills | Status: DC

## 2022-03-05 MED ORDER — FLUTICASONE-SALMETEROL 250-50 MCG/ACT IN AEPB
INHALATION_SPRAY | RESPIRATORY_TRACT | 1 refills | Status: DC
Start: 2022-03-05 — End: 2022-06-15

## 2022-03-05 MED ORDER — ALLOPURINOL 100 MG PO TABS: 100.0000 mg | ORAL_TABLET | Freq: Every day | ORAL | 1 refills | Status: DC

## 2022-03-05 NOTE — Assessment & Plan Note (Signed)
Stable. No concerns. Continue to monitor.  

## 2022-03-05 NOTE — Assessment & Plan Note (Signed)
Under good control on current regimen. Continue current regimen. Continue to monitor. Call with any concerns. Refills given. Labs drawn today.   

## 2022-03-05 NOTE — Assessment & Plan Note (Signed)
Under good control on current regimen. Continue current regimen. Continue to monitor. Call with any concerns. Refills given.   

## 2022-03-05 NOTE — Telephone Encounter (Signed)
Please get him in to see me at 2:20 today- needs EKG if HR was 200.  ?

## 2022-03-05 NOTE — Progress Notes (Signed)
? ?BP 137/66   Pulse 69   Temp 97.7 ?F (36.5 ?C)   Wt 159 lb (72.1 kg)   SpO2 95%   BMI 23.82 kg/m?   ? ?Subjective:  ? ? Patient ID: Carlos Richard, male    DOB: [birth year], 88 y.o.   MRN: [id number] ? ?HPI: ?Carlos Richard is a 88 y.o. male ? ?Chief Complaint  ?Patient presents with  ? Tachycardia  ?  Patient states he was at the gym yesterday and on the bicycle when he noticed his heart rate was 199. Patient states he felt fine.   ? ?Saw Dr. Chancy Milroy in March, due to have an echo in June. Notes not available at this time. ? ?He saw a cardiologist in [place]. They took him off the losartan and upped his metoprolol. Notes not available at this time.  ? ?Tachycardia ?Duration: 1x yesterday for a few minutes ?Symptom description: HR 199 on machine at gym ?Duration of episode: minutes ?Frequency:  once ?Activity when event occurred: exercising ?Related to exertion: yes ?Dyspnea: no ?Chest pain: no ?Syncope: no ?Anxiety/stress: no ?Nausea/vomiting: no ?Diaphoresis: no ?Coronary artery disease: no ?Congestive heart failure: no ?Arrhythmia:yes ?Thyroid disease: no ?Caffeine intake: 1 cafienated beverage ?Status:  better ?Treatments attempted:none ? ?HYPERTENSION / HYPERLIPIDEMIA ?Satisfied with current treatment? yes ?Duration of hypertension: chronic ?BP monitoring frequency: not checking ?BP medication side effects: no ?Past BP meds: metoprolol ?Duration of hyperlipidemia: chronic ?Cholesterol medication side effects: no ?Cholesterol supplements: none ?Past cholesterol medications: simvastatin ?Medication compliance: excellent compliance ?Aspirin: no ?Recent stressors: no ?Recurrent headaches: no ?Visual changes: no ?Palpitations: no ?Dyspnea: no ?Chest pain: no ?Lower extremity edema: no ?Dizzy/lightheaded: no ? ?No gout flares. Tolerating the allopurinol well ? ?Relevant past medical, surgical, family and social history reviewed and updated as indicated. Interim medical history since our last visit  reviewed. ?Allergies and medications reviewed and updated. ? ?Review of Systems  ?Constitutional: Negative.   ?HENT: Negative.    ?Respiratory: Negative.    ?Cardiovascular:  Positive for palpitations. Negative for chest pain and leg swelling.  ?Gastrointestinal: Negative.   ?Musculoskeletal: Negative.   ?Neurological: Negative.   ?Psychiatric/Behavioral: Negative.    ? ?Per HPI unless specifically indicated above ? ?   ?Objective:  ?  ?BP 137/66   Pulse 69   Temp 97.7 ?F (36.5 ?C)   Wt 159 lb (72.1 kg)   SpO2 95%   BMI 23.82 kg/m?   ?Wt Readings from Last 3 Encounters:  ?03/05/22 159 lb (72.1 kg)  ?02/17/22 153 lb 6.4 oz (69.6 kg)  ?06/01/21 166 lb 9.6 oz (75.6 kg)  ?  ?Physical Exam ?Vitals and nursing note reviewed.  ?Constitutional:   ?   General: He is not in acute distress. ?   Appearance: Normal appearance. He is not ill-appearing, toxic-appearing or diaphoretic.  ?HENT:  ?   Head: Normocephalic and atraumatic.  ?   Right Ear: External ear normal.  ?   Left Ear: External ear normal.  ?   Nose: Nose normal.  ?   Mouth/Throat:  ?   Mouth: Mucous membranes are moist.  ?   Pharynx: Oropharynx is clear.  ?Eyes:  ?   General: No scleral icterus.    ?   Right eye: No discharge.     ?   Left eye: No discharge.  ?   Extraocular Movements: Extraocular movements intact.  ?   Conjunctiva/sclera: Conjunctivae normal.  ?   Pupils: Pupils are equal, round, and reactive to  light.  ?Cardiovascular:  ?   Rate and Rhythm: Normal rate and regular rhythm.  ?   Pulses: Normal pulses.  ?   Heart sounds: Normal heart sounds. No murmur heard. ?  No friction rub. No gallop.  ?Pulmonary:  ?   Effort: Pulmonary effort is normal. No respiratory distress.  ?   Breath sounds: Normal breath sounds. No stridor. No wheezing, rhonchi or rales.  ?Chest:  ?   Chest wall: No tenderness.  ?Musculoskeletal:     ?   General: Normal range of motion.  ?   Cervical back: Normal range of motion and neck supple.  ?Skin: ?   General: Skin is warm and  dry.  ?   Capillary Refill: Capillary refill takes less than 2 seconds.  ?   Coloration: Skin is not jaundiced or pale.  ?   Findings: No bruising, erythema, lesion or rash.  ?Neurological:  ?   General: No focal deficit present.  ?   Mental Status: He is alert and oriented to person, place, and time. Mental status is at baseline.  ?Psychiatric:     ?   Mood and Affect: Mood normal.     ?   Behavior: Behavior normal.     ?   Thought Content: Thought content normal.     ?   Judgment: Judgment normal.  ? ? ?Results for orders placed or performed in visit on 03/05/22  ?Urinalysis, Routine w reflex microscopic  ?Result Value Ref Range  ? Specific Gravity, UA 1.020 1.005 - 1.030  ? pH, UA 5.5 5.0 - 7.5  ? Color, UA Yellow Yellow  ? Appearance Ur Clear Clear  ? Leukocytes,UA Negative Negative  ? Protein,UA Negative Negative/Trace  ? Glucose, UA Negative Negative  ? Ketones, UA Negative Negative  ? RBC, UA Negative Negative  ? Bilirubin, UA Negative Negative  ? Urobilinogen, Ur 0.2 0.2 - 1.0 mg/dL  ? Nitrite, UA Negative Negative  ? ?   ?Assessment & Plan:  ? ?Problem List Items Addressed This Visit   ? ?  ? Cardiovascular and Mediastinum  ? Atrial fibrillation ([place])  ?  NSR rhythm today at 70bpm. Will let cardiology know about tachycardia yesterday. Checking labs. Continue to follow with cardiology.  ? ?  ?  ? Relevant Medications  ? metoprolol succinate (TOPROL-XL) 50 MG 24 hr tablet  ? simvastatin (ZOCOR) 20 MG tablet  ? Senile purpura ([place])  ?  Stable. No concerns. Continue to monitor.  ? ?  ?  ? Relevant Medications  ? metoprolol succinate (TOPROL-XL) 50 MG 24 hr tablet  ? simvastatin (ZOCOR) 20 MG tablet  ?  ? Respiratory  ? COPD (chronic obstructive pulmonary disease) ([place])  ?  Under good control on current regimen. Continue current regimen. Continue to monitor. Call with any concerns. Refills given.  ? ? ?  ?  ? Relevant Medications  ? fluticasone-salmeterol (ADVAIR DISKUS) 250-50 MCG/ACT AEPB  ?  ? Genitourinary   ? Benign hypertensive renal disease  ?  Under good control on current regimen. Continue current regimen. Continue to monitor. Call with any concerns. Refills given. Labs drawn today. ? ? ?  ?  ? Relevant Orders  ? CBC with Differential/Platelet  ? TSH  ? Urinalysis, Routine w reflex microscopic (Completed)  ? CKD (chronic kidney disease) stage 3, GFR 30-59 ml/min  ?  Rechecking labs today. Await results. Treat as needed.  ? ?  ?  ?  ? Other  ?  Gout  ?  Under good control on current regimen. Continue current regimen. Continue to monitor. Call with any concerns. Refills given. Labs drawn today. ? ? ?  ?  ? Relevant Orders  ? Uric acid  ? Mixed hyperlipidemia  ?  Under good control on current regimen. Continue current regimen. Continue to monitor. Call with any concerns. Refills given. Labs drawn today. ? ? ?  ?  ? Relevant Medications  ? metoprolol succinate (TOPROL-XL) 50 MG 24 hr tablet  ? simvastatin (ZOCOR) 20 MG tablet  ? Other Relevant Orders  ? Comprehensive metabolic panel  ? CBC with Differential/Platelet  ? Lipid Panel w/o Chol/HDL Ratio  ? ?Other Visit Diagnoses   ? ? Tachycardia    -  Primary  ? Resolved today. Will check labs. Following closely with cardiology. Will make them aware of yesterday. Call with any concerns.   ? Relevant Orders  ? EKG 12-Lead (Completed)  ? CBC with Differential/Platelet  ? TSH  ? ?  ?  ? ?Follow up plan: ?Return after 7/21 for physical. ? ? ? ? ? ?

## 2022-03-05 NOTE — Assessment & Plan Note (Signed)
Rechecking labs today. Await results. Treat as needed.  °

## 2022-03-05 NOTE — Telephone Encounter (Signed)
?  Chief Complaint: Medication question - Should pt be taking losartan ?Symptoms: possible elevated heart rate ?Frequency: at the gym ?Pertinent Negatives: Patient denies Feeling poorly in any way ?Disposition: [] ED /[] Urgent Care (no appt availability in office) / [] Appointment(In office/virtual)/ []  [facility]/ [] Home Care/ [] Refused Recommended Disposition /[] [facility]/ [x]  Follow-up with PCP ?Additional Notes: Wife of pt called, permission to speak with wife gotten from pt. Wife would like to know if pt should resume Cozaar. HR of 200 was recorded by gym equipment, and wife does not know if that is accurate. Wife/pt are unable to determine pt's heart rate. However pt states he feels"just fine".  ? ? ?Summary: Losartan Advice  ? Pt wife is calling to discuss the patients losartan (COZAAR) 50 MG tablet . Pt was taken off the medication 09/22 when he was in the hospital. Pt went to the gym yesterday and his heart rate at the 200. Pts wife is wanting advise if the patient can return to the losartan? Pts wife has medicaton but the date on the medication is 06/01/21 please advise   ?  ? ?Answer Assessment - Initial Assessment Questions ?1. NAME of MEDICATION: "What medicine are you calling about?" ?    Losartan ?2. QUESTION: "What is your question?" (e.g., double dose of medicine, side effect) ?    Should pt resume this medication. ?3. PRESCRIBING HCP: "Who prescribed it?" Reason: if prescribed by specialist, call should be referred to that group. ?     ?4. SYMPTOMS: "Do you have any symptoms?" ?     ?5. SEVERITY: If symptoms are present, ask "Are they mild, moderate or severe?" ?     ?6. PREGNANCY:  "Is there any chance that you are pregnant?" "When was your last menstrual period?" ?    na ? ?Protocols used: Medication Question Call-A-AH ? ?

## 2022-03-05 NOTE — Telephone Encounter (Signed)
Patient scheduled @ 3:20 today ?

## 2022-03-05 NOTE — Assessment & Plan Note (Signed)
NSR rhythm today at 70bpm. Will let cardiology know about tachycardia yesterday. Checking labs. Continue to follow with cardiology.  ?

## 2022-03-05 NOTE — Progress Notes (Signed)
NSR at 62bpm, no ST segment changes ?

## 2022-03-05 NOTE — Telephone Encounter (Signed)
Summary: Losartan Advice  ? Pt wife is calling to discuss the patients losartan (COZAAR) 50 MG tablet [id number] . Pt was taken off the medication 09/22 when he was in the hospital. Pt went to the gym yesterday and his heart rate at the 200. Pts wife is wanting advise if the patient can return to the losartan? Pts wife has medicaton but the date on the medication is 06/01/21 please advise  ?  ?Called and LM on VM to call back. ?Per chart review, med was stopped "pt not taking" and the date was from April of this year.  ?

## 2022-03-06 LAB — COMPREHENSIVE METABOLIC PANEL
ALT: 17 IU/L (ref 0–44)
AST: 26 IU/L (ref 0–40)
Albumin/Globulin Ratio: 2.4 — ABNORMAL HIGH (ref 1.2–2.2)
Albumin: 4.4 g/dL (ref 3.6–4.6)
Alkaline Phosphatase: 76 IU/L (ref 44–121)
BUN/Creatinine Ratio: 15 (ref 10–24)
BUN: 17 mg/dL (ref 8–27)
Bilirubin Total: 0.4 mg/dL (ref 0.0–1.2)
CO2: 26 mmol/L (ref 20–29)
Calcium: 9.4 mg/dL (ref 8.6–10.2)
Chloride: 102 mmol/L (ref 96–106)
Creatinine, Ser: 1.12 mg/dL (ref 0.76–1.27)
Globulin, Total: 1.8 g/dL (ref 1.5–4.5)
Glucose: 108 mg/dL — ABNORMAL HIGH (ref 70–99)
Potassium: 4 mmol/L (ref 3.5–5.2)
Sodium: 142 mmol/L (ref 134–144)
Total Protein: 6.2 g/dL (ref 6.0–8.5)
eGFR: 63 mL/min/{1.73_m2} (ref >59)

## 2022-03-06 LAB — CBC WITH DIFFERENTIAL/PLATELET
Basophils Absolute: 0.1 10*3/uL (ref 0.0–0.2)
Basos: 1 %
EOS (ABSOLUTE): 0.4 10*3/uL (ref 0.0–0.4)
Eos: 5 %
Hematocrit: 40.4 % (ref 37.5–51.0)
Hemoglobin: 13.7 g/dL (ref 13.0–17.7)
Immature Grans (Abs): 0 10*3/uL (ref 0.0–0.1)
Immature Granulocytes: 0 %
Lymphocytes Absolute: 3.5 10*3/uL — ABNORMAL HIGH (ref 0.7–3.1)
Lymphs: 38 %
MCH: 30.6 pg (ref 26.6–33.0)
MCHC: 33.9 g/dL (ref 31.5–35.7)
MCV: 90 fL (ref 79–97)
Monocytes Absolute: 0.6 10*3/uL (ref 0.1–0.9)
Monocytes: 7 %
Neutrophils Absolute: 4.5 10*3/uL (ref 1.4–7.0)
Neutrophils: 49 %
Platelets: 199 10*3/uL (ref 150–450)
RBC: 4.48 x10E6/uL (ref 4.14–5.80)
RDW: 13.1 % (ref 11.6–15.4)
WBC: 9.1 10*3/uL (ref 3.4–10.8)

## 2022-03-06 LAB — LIPID PANEL W/O CHOL/HDL RATIO
Cholesterol, Total: 137 mg/dL (ref 100–199)
HDL: 39 mg/dL — ABNORMAL LOW (ref >39)
LDL Chol Calc (NIH): 68 mg/dL (ref 0–99)
Triglycerides: 180 mg/dL — ABNORMAL HIGH (ref 0–149)
VLDL Cholesterol Cal: 30 mg/dL (ref 5–40)

## 2022-03-06 LAB — URIC ACID: Uric Acid: 5.3 mg/dL (ref 3.8–8.4)

## 2022-03-06 LAB — TSH: TSH: 1.79 u[IU]/mL (ref 0.450–4.500)

## 2022-03-08 NOTE — Progress Notes (Signed)
Patient scheduled 7/25 for physical ?

## 2022-06-08 ENCOUNTER — Encounter: Payer: Medicare Other | Admitting: Family Medicine

## 2022-06-15 ENCOUNTER — Encounter: Payer: Self-pay | Admitting: Family Medicine

## 2022-06-15 ENCOUNTER — Ambulatory Visit (INDEPENDENT_AMBULATORY_CARE_PROVIDER_SITE_OTHER): Payer: Medicare Other | Admitting: Family Medicine

## 2022-06-15 VITALS — BP 138/66 | HR 59 | Temp 97.9°F | Wt 163.2 lb

## 2022-06-15 DIAGNOSIS — N1831 Chronic kidney disease, stage 3a: Secondary | ICD-10-CM

## 2022-06-15 DIAGNOSIS — Z23 Encounter for immunization: Secondary | ICD-10-CM

## 2022-06-15 DIAGNOSIS — M1A9XX1 Chronic gout, unspecified, with tophus (tophi): Secondary | ICD-10-CM | POA: Diagnosis not present

## 2022-06-15 DIAGNOSIS — H6122 Impacted cerumen, left ear: Secondary | ICD-10-CM

## 2022-06-15 DIAGNOSIS — I129 Hypertensive chronic kidney disease with stage 1 through stage 4 chronic kidney disease, or unspecified chronic kidney disease: Secondary | ICD-10-CM | POA: Diagnosis not present

## 2022-06-15 DIAGNOSIS — G8929 Other chronic pain: Secondary | ICD-10-CM

## 2022-06-15 DIAGNOSIS — E782 Mixed hyperlipidemia: Secondary | ICD-10-CM | POA: Diagnosis not present

## 2022-06-15 DIAGNOSIS — M25512 Pain in left shoulder: Secondary | ICD-10-CM

## 2022-06-15 DIAGNOSIS — J439 Emphysema, unspecified: Secondary | ICD-10-CM

## 2022-06-15 DIAGNOSIS — Z0000 Encounter for general adult medical examination without abnormal findings: Secondary | ICD-10-CM

## 2022-06-15 DIAGNOSIS — D692 Other nonthrombocytopenic purpura: Secondary | ICD-10-CM

## 2022-06-15 LAB — URINALYSIS, ROUTINE W REFLEX MICROSCOPIC
Bilirubin, UA: NEGATIVE
Glucose, UA: NEGATIVE
Ketones, UA: NEGATIVE
Leukocytes,UA: NEGATIVE
Nitrite, UA: NEGATIVE
Protein,UA: NEGATIVE
RBC, UA: NEGATIVE
Specific Gravity, UA: 1.02 (ref 1.005–1.030)
Urobilinogen, Ur: 0.2 mg/dL (ref 0.2–1.0)
pH, UA: 7.5 (ref 5.0–7.5)

## 2022-06-15 LAB — MICROALBUMIN, URINE WAIVED
Creatinine, Urine Waived: 100 mg/dL (ref 10–300)
Microalb, Ur Waived: 80 mg/L — ABNORMAL HIGH (ref 0–19)

## 2022-06-15 MED ORDER — SIMVASTATIN 20 MG PO TABS
20.0000 mg | ORAL_TABLET | Freq: Every day | ORAL | 1 refills | Status: DC
Start: 2022-06-15 — End: 2022-12-14

## 2022-06-15 MED ORDER — FLUTICASONE-SALMETEROL 250-50 MCG/ACT IN AEPB: INHALATION_SPRAY | RESPIRATORY_TRACT | 1 refills | Status: DC

## 2022-06-15 MED ORDER — ALLOPURINOL 100 MG PO TABS: 100.0000 mg | ORAL_TABLET | Freq: Every day | ORAL | 1 refills | Status: DC

## 2022-06-15 NOTE — Progress Notes (Addendum)
BP 138/66   Pulse (!) 59   Temp 97.9 F (36.6 C)   Wt 163 lb 3.2 oz (74 kg)   SpO2 96%   BMI 24.45 kg/m    Subjective:    Patient ID: Carlos Richard, male    DOB: [birth year], 88 y.o.   MRN: [id number]  HPI: Carlos Richard is a 88 y.o. male presenting on 06/15/2022 for comprehensive medical examination. Current medical complaints include:  HYPERTENSION / HYPERLIPIDEMIA Satisfied with current treatment? yes Duration of hypertension: chronic BP monitoring frequency: not checking BP medication side effects: no Past BP meds: metoprolol, lasix Duration of hyperlipidemia: chronic Cholesterol medication side effects: no Cholesterol supplements: none Past cholesterol medications: simvastatin Medication compliance: excellent compliance Aspirin: no Recent stressors: no Recurrent headaches: no Visual changes: no Palpitations: no Dyspnea: no Chest pain: no Lower extremity edema: no Dizzy/lightheaded: no  No gout flares. Tolerating his medicine well. No concerns.   COPD COPD status: controlled Satisfied with current treatment?: yes Oxygen use: no Dyspnea frequency: rarely Cough frequency: rarely Rescue inhaler frequency: rarely   Limitation of activity: no Productive cough: no Pneumovax: administered today Influenza: Up to Date   He currently lives with: wife Interim Problems from his last visit: no  Depression Screen done today and results listed below:     06/15/2022    9:01 AM 02/17/2022   10:24 AM 06/01/2021    3:09 PM 02/16/2021    2:36 PM 09/16/2020    2:20 PM  Depression screen PHQ 2/9  Decreased Interest 0 0 0 0 0  Down, Depressed, Hopeless 0 0 0 0 0  PHQ - 2 Score 0 0 0 0 0  Altered sleeping 0    0  Tired, decreased energy 0    0  Change in appetite 0    0  Feeling bad or failure about yourself  0    0  Trouble concentrating 0    0  Moving slowly or fidgety/restless 0    0  Suicidal thoughts 0    0  PHQ-9 Score 0    0  Difficult doing work/chores      Not difficult at all     Past Medical History:  Past Medical History:  Diagnosis Date   Atrial fibrillation ([place])    COPD (chronic obstructive pulmonary disease) ([place])    Hypertension    Near syncope    Tremors of nervous system     Surgical History:  Past Surgical History:  Procedure Laterality Date   BACK SURGERY     BRAIN SURGERY     stimulator placement for tremors   EYE SURGERY Left 2020   cataract surgery nov    EYE SURGERY Right 2020   cataract surgery dec    Medications:  Current Outpatient Medications on File Prior to Visit  Medication Sig   furosemide (LASIX) 20 MG tablet Take 20 mg by mouth 3 (three) times a week. Monday, Wednesday, Friday   magnesium oxide (MAG-OX) 400 MG tablet Take 400 mg by mouth daily.   metoprolol succinate (TOPROL-XL) 50 MG 24 hr tablet Take 50 mg by mouth daily.   Multiple Vitamins-Minerals (ZINC PO) Take 1 tablet by mouth daily.   XARELTO 15 MG TABS tablet Take 15 mg by mouth daily with supper.    Zinc 50 MG TABS Take 50 mg by mouth daily.    No current facility-administered medications on file prior to visit.    Allergies:  No Known Allergies  Social History:  Social History   Socioeconomic History   Marital status: Married    Spouse name: Not on file   Number of children: Not on file   Years of education: Not on file   Highest education level: Associate degree: academic program  Occupational History   Not on file  Tobacco Use   Smoking status: Former    Types: Cigarettes    Quit date: 1980    Years since quitting: 43.6   Smokeless tobacco: Never   Tobacco comments:    quit 40 years ago   Vaping Use   Vaping Use: Never used  Substance and Sexual Activity   Alcohol use: Yes    Comment: occasionally   Drug use: No   Sexual activity: Yes  Other Topics Concern   Not on file  Social History Narrative   Not on file   Social Determinants of Health   Financial Resource Strain: Low Risk  (02/17/2022)   Overall  Financial Resource Strain (CARDIA)    Difficulty of Paying Living Expenses: Not hard at all  Food Insecurity: No Food Insecurity (02/17/2022)   Hunger Vital Sign    Worried About Running Out of Food in the Last Year: Never true    [place] in the Last Year: Never true  Transportation Needs: Unknown (02/17/2022)   PRAPARE - Hydrologist (Medical): No    Lack of Transportation (Non-Medical): Not on file  Physical Activity: Inactive (02/17/2022)   Exercise Vital Sign    Days of Exercise per Week: 0 days    Minutes of Exercise per Session: 0 min  Stress: No Stress Concern Present (02/17/2022)   [place]    Feeling of Stress : Not at all  Social Connections: Moderately Integrated (02/17/2022)   Social Connection and Isolation Panel [NHANES]    Frequency of Communication with Friends and Family: More than three times a week    Frequency of Social Gatherings with Friends and Family: Three times a week    Attends Religious Services: More than 4 times per year    Active Member of Clubs or Organizations: No    Attends Archivist Meetings: Never    Marital Status: Married  Human resources officer Violence: Not At Risk (02/17/2022)   Humiliation, Afraid, Rape, and Kick questionnaire    Fear of Current or Ex-Partner: No    Emotionally Abused: No    Physically Abused: No    Sexually Abused: No   Social History   Tobacco Use  Smoking Status Former   Types: Cigarettes   Quit date: 1980   Years since quitting: 43.6  Smokeless Tobacco Never  Tobacco Comments   quit 40 years ago    Social History   Substance and Sexual Activity  Alcohol Use Yes   Comment: occasionally    Family History:  Family History  Problem Relation Age of Onset   Tuberculosis Mother    Tremor Paternal Grandmother    Obesity Sister    Prostate cancer Neg Hx    Bladder Cancer Neg Hx    Kidney cancer Neg Hx      Past medical history, surgical history, medications, allergies, family history and social history reviewed with patient today and changes made to appropriate areas of the chart.   Review of Systems  Constitutional: Negative.   HENT:  Positive for hearing loss. Negative for congestion, ear discharge, ear pain, nosebleeds, sinus  pain, sore throat and tinnitus.   Eyes: Negative.   Respiratory:  Positive for cough (occasionally). Negative for hemoptysis, sputum production, shortness of breath, wheezing and stridor.   Cardiovascular: Negative.   Gastrointestinal:  Positive for diarrhea (soft/loose stools- no diarrhea). Negative for abdominal pain, blood in stool, constipation, heartburn, melena, nausea and vomiting.  Genitourinary: Negative.   Musculoskeletal:  Positive for joint pain and myalgias. Negative for back pain, falls and neck pain.  Skin: Negative.   Neurological: Negative.   Endo/Heme/Allergies:  Negative for environmental allergies and polydipsia. Bruises/bleeds easily.  Psychiatric/Behavioral: Negative.     All other ROS negative except what is listed above and in the HPI.      Objective:    BP 138/66   Pulse (!) 59   Temp 97.9 F (36.6 C)   Wt 163 lb 3.2 oz (74 kg)   SpO2 96%   BMI 24.45 kg/m   Wt Readings from Last 3 Encounters:  06/15/22 163 lb 3.2 oz (74 kg)  03/05/22 159 lb (72.1 kg)  02/17/22 153 lb 6.4 oz (69.6 kg)    Physical Exam Vitals and nursing note reviewed.  Constitutional:      General: He is not in acute distress.    Appearance: Normal appearance. He is normal weight. He is not ill-appearing, toxic-appearing or diaphoretic.  HENT:     Head: Normocephalic and atraumatic.     Right Ear: Tympanic membrane, ear canal and external ear normal. There is no impacted cerumen.     Left Ear: Ear canal and external ear normal. There is impacted cerumen.     Nose: Nose normal. No congestion or rhinorrhea.     Mouth/Throat:     Mouth: Mucous membranes  are moist.     Pharynx: Oropharynx is clear. No oropharyngeal exudate or posterior oropharyngeal erythema.  Eyes:     General: No scleral icterus.       Right eye: No discharge.        Left eye: No discharge.     Extraocular Movements: Extraocular movements intact.     Conjunctiva/sclera: Conjunctivae normal.     Pupils: Pupils are equal, round, and reactive to light.  Neck:     Vascular: No carotid bruit.  Cardiovascular:     Rate and Rhythm: Normal rate and regular rhythm.     Pulses: Normal pulses.     Heart sounds: No murmur heard.    No friction rub. No gallop.  Pulmonary:     Effort: Pulmonary effort is normal. No respiratory distress.     Breath sounds: Normal breath sounds. No stridor. No wheezing, rhonchi or rales.  Chest:     Chest wall: No tenderness.  Abdominal:     General: Abdomen is flat. Bowel sounds are normal. There is no distension.     Palpations: Abdomen is soft. There is no mass.     Tenderness: There is no abdominal tenderness. There is no right CVA tenderness, left CVA tenderness, guarding or rebound.     Hernia: No hernia is present.  Genitourinary:    Comments: Genital exam deferred with shared decision making Musculoskeletal:        General: No swelling, tenderness, deformity or signs of injury.     Cervical back: Normal range of motion and neck supple. No rigidity. No muscular tenderness.     Right lower leg: No edema.     Left lower leg: No edema.  Lymphadenopathy:     Cervical: No cervical adenopathy.  Skin:    General: Skin is warm and dry.     Capillary Refill: Capillary refill takes less than 2 seconds.     Coloration: Skin is not jaundiced or pale.     Findings: No bruising, erythema, lesion or rash.  Neurological:     General: No focal deficit present.     Mental Status: He is alert and oriented to person, place, and time.     Cranial Nerves: No cranial nerve deficit.     Sensory: No sensory deficit.     Motor: No weakness.      Coordination: Coordination normal.     Gait: Gait normal.     Deep Tendon Reflexes: Reflexes normal.  Psychiatric:        Mood and Affect: Mood normal.        Behavior: Behavior normal.        Thought Content: Thought content normal.        Judgment: Judgment normal.     Results for orders placed or performed in visit on 03/05/22  Comprehensive metabolic panel  Result Value Ref Range   Glucose 108 (H) 70 - 99 mg/dL   BUN 17 8 - 27 mg/dL   Creatinine, Ser 1.12 0.76 - 1.27 mg/dL   eGFR 63 >59 mL/min/1.73   BUN/Creatinine Ratio 15 10 - 24   Sodium 142 134 - 144 mmol/L   Potassium 4.0 3.5 - 5.2 mmol/L   Chloride 102 96 - 106 mmol/L   CO2 26 20 - 29 mmol/L   Calcium 9.4 8.6 - 10.2 mg/dL   Total Protein 6.2 6.0 - 8.5 g/dL   Albumin 4.4 3.6 - 4.6 g/dL   Globulin, Total 1.8 1.5 - 4.5 g/dL   Albumin/Globulin Ratio 2.4 (H) 1.2 - 2.2   Bilirubin Total 0.4 0.0 - 1.2 mg/dL   Alkaline Phosphatase 76 44 - 121 IU/L   AST 26 0 - 40 IU/L   ALT 17 0 - 44 IU/L  CBC with Differential/Platelet  Result Value Ref Range   WBC 9.1 3.4 - 10.8 x10E3/uL   RBC 4.48 4.14 - 5.80 x10E6/uL   Hemoglobin 13.7 13.0 - 17.7 g/dL   Hematocrit 40.4 37.5 - 51.0 %   MCV 90 79 - 97 fL   MCH 30.6 26.6 - 33.0 pg   MCHC 33.9 31.5 - 35.7 g/dL   RDW 13.1 11.6 - 15.4 %   Platelets 199 150 - 450 x10E3/uL   Neutrophils 49 Not Estab. %   Lymphs 38 Not Estab. %   Monocytes 7 Not Estab. %   Eos 5 Not Estab. %   Basos 1 Not Estab. %   Neutrophils Absolute 4.5 1.4 - 7.0 x10E3/uL   Lymphocytes Absolute 3.5 (H) 0.7 - 3.1 x10E3/uL   Monocytes Absolute 0.6 0.1 - 0.9 x10E3/uL   EOS (ABSOLUTE) 0.4 0.0 - 0.4 x10E3/uL   Basophils Absolute 0.1 0.0 - 0.2 x10E3/uL   Immature Granulocytes 0 Not Estab. %   Immature Grans (Abs) 0.0 0.0 - 0.1 x10E3/uL  Lipid Panel w/o Chol/HDL Ratio  Result Value Ref Range   Cholesterol, Total 137 100 - 199 mg/dL   Triglycerides 180 (H) 0 - 149 mg/dL   HDL 39 (L) >39 mg/dL   VLDL Cholesterol  Cal 30 5 - 40 mg/dL   LDL Chol Calc (NIH) 68 0 - 99 mg/dL  TSH  Result Value Ref Range   TSH 1.790 0.450 - 4.500 uIU/mL  Urinalysis, Routine w reflex microscopic  Result  Value Ref Range   Specific Gravity, UA 1.020 1.005 - 1.030   pH, UA 5.5 5.0 - 7.5   Color, UA Yellow Yellow   Appearance Ur Clear Clear   Leukocytes,UA Negative Negative   Protein,UA Negative Negative/Trace   Glucose, UA Negative Negative   Ketones, UA Negative Negative   RBC, UA Negative Negative   Bilirubin, UA Negative Negative   Urobilinogen, Ur 0.2 0.2 - 1.0 mg/dL   Nitrite, UA Negative Negative  Uric acid  Result Value Ref Range   Uric Acid 5.3 3.8 - 8.4 mg/dL      Assessment & Plan:   Problem List Items Addressed This Visit       Cardiovascular and Mediastinum   Senile purpura ([place])    Reassured patient. Continue to monitor.       Relevant Medications   simvastatin (ZOCOR) 20 MG tablet   Other Relevant Orders   Comprehensive metabolic panel   CBC with Differential/Platelet     Respiratory   COPD (chronic obstructive pulmonary disease) ([place])    Under good control on current regimen. Continue current regimen. Continue to monitor. Call with any concerns. Refills given.        Relevant Medications   fluticasone-salmeterol (ADVAIR DISKUS) 250-50 MCG/ACT AEPB   Other Relevant Orders   Comprehensive metabolic panel   CBC with Differential/Platelet     Genitourinary   Benign hypertensive renal disease    Under good control on current regimen. Continue current regimen. Continue to monitor. Call with any concerns. Refills given. Labs drawn today.        Relevant Orders   Comprehensive metabolic panel   CBC with Differential/Platelet   TSH   Microalbumin, Urine Waived   CKD (chronic kidney disease) stage 3, GFR 30-59 ml/min    Rechecking labs today. Await results. Treat as needed.       Relevant Orders   Comprehensive metabolic panel   CBC with Differential/Platelet   Urinalysis,  Routine w reflex microscopic   Microalbumin, Urine Waived     Other   Gout    Under good control on current regimen. Continue current regimen. Continue to monitor. Call with any concerns. Refills given. Labs drawn today.       Relevant Orders   Comprehensive metabolic panel   CBC with Differential/Platelet   Uric acid   Mixed hyperlipidemia    Under good control on current regimen. Continue current regimen. Continue to monitor. Call with any concerns. Refills given. Labs drawn today.       Relevant Medications   simvastatin (ZOCOR) 20 MG tablet   Other Relevant Orders   Comprehensive metabolic panel   CBC with Differential/Platelet   Lipid Panel w/o Chol/HDL Ratio   Other Visit Diagnoses     Routine general medical examination at a health care facility    -  Primary   Vaccines up to date. Screening labs checked today. Continue diet and exercise. Call with any concerns.    Chronic left shoulder pain       Relevant Orders   Ambulatory referral to Orthopedic Surgery        Discussed aspirin prophylaxis for myocardial infarction prevention and decision was it was not indicated  LABORATORY TESTING:  Health maintenance labs ordered today as discussed above.   IMMUNIZATIONS:   - Tdap: Tetanus vaccination status reviewed: last tetanus booster within 10 years. - Influenza: Up to date - Pneumovax: Administered today - Prevnar: Up to date - COVID: Up to date -  HPV: Not applicable - Shingrix vaccine: Up to date  SCREENING: - Colonoscopy: Not applicable  Discussed with patient purpose of the colonoscopy is to detect colon cancer at curable precancerous or early stages   PATIENT COUNSELING:    Sexuality: Discussed sexually transmitted diseases, partner selection, use of condoms, avoidance of unintended pregnancy  and contraceptive alternatives.   Advised to avoid cigarette smoking.  I discussed with the patient that most people either abstain from alcohol or drink within  safe limits (<=14/week and <=4 drinks/occasion for males, <=7/weeks and <= 3 drinks/occasion for females) and that the risk for alcohol disorders and other health effects rises proportionally with the number of drinks per week and how often a drinker exceeds daily limits.  Discussed cessation/primary prevention of drug use and availability of treatment for abuse.   Diet: Encouraged to adjust caloric intake to maintain  or achieve ideal body weight, to reduce intake of dietary saturated fat and total fat, to limit sodium intake by avoiding high sodium foods and not adding table salt, and to maintain adequate dietary potassium and calcium preferably from fresh fruits, vegetables, and low-fat dairy products.    stressed the importance of regular exercise  Injury prevention: Discussed safety belts, safety helmets, smoke detector, smoking near bedding or upholstery.   Dental health: Discussed importance of regular tooth brushing, flossing, and dental visits.   Follow up plan: NEXT PREVENTATIVE PHYSICAL DUE IN 1 YEAR. Return in about 3 months (around 09/15/2022).

## 2022-06-15 NOTE — Assessment & Plan Note (Signed)
Under good control on current regimen. Continue current regimen. Continue to monitor. Call with any concerns. Refills given. Labs drawn today.   

## 2022-06-15 NOTE — Assessment & Plan Note (Signed)
Reassured patient. Continue to monitor.  

## 2022-06-15 NOTE — Assessment & Plan Note (Signed)
Rechecking labs today. Await results. Treat as needed.  °

## 2022-06-15 NOTE — Assessment & Plan Note (Signed)
Under good control on current regimen. Continue current regimen. Continue to monitor. Call with any concerns. Refills given.   

## 2022-06-16 LAB — CBC WITH DIFFERENTIAL/PLATELET
Basophils Absolute: 0.1 10*3/uL (ref 0.0–0.2)
Basos: 1 %
EOS (ABSOLUTE): 0.4 10*3/uL (ref 0.0–0.4)
Eos: 5 %
Hematocrit: 42.7 % (ref 37.5–51.0)
Hemoglobin: 14.4 g/dL (ref 13.0–17.7)
Immature Grans (Abs): 0 10*3/uL (ref 0.0–0.1)
Immature Granulocytes: 0 %
Lymphocytes Absolute: 3.4 10*3/uL — ABNORMAL HIGH (ref 0.7–3.1)
Lymphs: 39 %
MCH: 31.4 pg (ref 26.6–33.0)
MCHC: 33.7 g/dL (ref 31.5–35.7)
MCV: 93 fL (ref 79–97)
Monocytes Absolute: 0.7 10*3/uL (ref 0.1–0.9)
Monocytes: 8 %
Neutrophils Absolute: 4.2 10*3/uL (ref 1.4–7.0)
Neutrophils: 47 %
Platelets: 203 10*3/uL (ref 150–450)
RBC: 4.59 x10E6/uL (ref 4.14–5.80)
RDW: 13.2 % (ref 11.6–15.4)
WBC: 8.8 10*3/uL (ref 3.4–10.8)

## 2022-06-16 LAB — COMPREHENSIVE METABOLIC PANEL
ALT: 19 IU/L (ref 0–44)
AST: 24 IU/L (ref 0–40)
Albumin/Globulin Ratio: 1.9 (ref 1.2–2.2)
Albumin: 4.2 g/dL (ref 3.7–4.7)
Alkaline Phosphatase: 71 IU/L (ref 44–121)
BUN/Creatinine Ratio: 8 — ABNORMAL LOW (ref 10–24)
BUN: 9 mg/dL (ref 8–27)
Bilirubin Total: 0.7 mg/dL (ref 0.0–1.2)
CO2: 24 mmol/L (ref 20–29)
Calcium: 9.3 mg/dL (ref 8.6–10.2)
Chloride: 105 mmol/L (ref 96–106)
Creatinine, Ser: 1.17 mg/dL (ref 0.76–1.27)
Globulin, Total: 2.2 g/dL (ref 1.5–4.5)
Glucose: 100 mg/dL — ABNORMAL HIGH (ref 70–99)
Potassium: 4.2 mmol/L (ref 3.5–5.2)
Sodium: 143 mmol/L (ref 134–144)
Total Protein: 6.4 g/dL (ref 6.0–8.5)
eGFR: 60 mL/min/{1.73_m2} (ref >59)

## 2022-06-16 LAB — LIPID PANEL W/O CHOL/HDL RATIO
Cholesterol, Total: 134 mg/dL (ref 100–199)
HDL: 43 mg/dL (ref >39)
LDL Chol Calc (NIH): 71 mg/dL (ref 0–99)
Triglycerides: 106 mg/dL (ref 0–149)
VLDL Cholesterol Cal: 20 mg/dL (ref 5–40)

## 2022-06-16 LAB — TSH: TSH: 1.72 u[IU]/mL (ref 0.450–4.500)

## 2022-06-16 LAB — URIC ACID: Uric Acid: 6.4 mg/dL (ref 3.8–8.4)

## 2022-06-18 ENCOUNTER — Ambulatory Visit: Payer: Medicare Other | Admitting: Physician Assistant

## 2022-06-18 ENCOUNTER — Encounter: Payer: Self-pay | Admitting: Physician Assistant

## 2022-06-18 VITALS — BP 172/84 | HR 67 | Temp 97.8°F | Wt 163.0 lb

## 2022-06-18 DIAGNOSIS — S41111A Laceration without foreign body of right upper arm, initial encounter: Secondary | ICD-10-CM
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
## 2022-06-18 NOTE — Patient Instructions (Signed)
Keep the dressing on for the rest of the day  Please get non-stick gauze from the drug store   To cleanse the area please use a gentle soap and warm water with a washcloth  Pat the wound dry and apply a new dressing once per day  To make a new dressing apply Aquaphor or Vaseline to the gauze and then apply that side to the wound Wrap the wound with gauze and tape the end edge so it will stay wrapped  Please return on Wed so we can check the area for healing and infection

## 2022-06-23 ENCOUNTER — Ambulatory Visit (INDEPENDENT_AMBULATORY_CARE_PROVIDER_SITE_OTHER): Payer: Medicare Other | Admitting: Physician Assistant

## 2022-06-23 ENCOUNTER — Encounter: Payer: Self-pay | Admitting: Physician Assistant

## 2022-06-23 VITALS — BP 161/82 | HR 59 | Temp 98.0°F | Ht 67.72 in | Wt 162.5 lb

## 2022-06-23 DIAGNOSIS — S41111D Laceration without foreign body of right upper arm, subsequent encounter: Secondary | ICD-10-CM
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
## 2022-09-15 ENCOUNTER — Ambulatory Visit: Payer: Medicare Other | Admitting: Family Medicine

## 2022-09-16 ENCOUNTER — Encounter: Payer: Self-pay | Admitting: Family Medicine

## 2022-09-16 ENCOUNTER — Ambulatory Visit: Payer: Medicare Other | Admitting: Family Medicine

## 2022-09-16 VITALS — BP 138/72 | HR 62 | Temp 97.9°F | Wt 163.6 lb

## 2022-09-16 DIAGNOSIS — E782 Mixed hyperlipidemia: Secondary | ICD-10-CM

## 2022-09-16 DIAGNOSIS — I129 Hypertensive chronic kidney disease with stage 1 through stage 4 chronic kidney disease, or unspecified chronic kidney disease: Secondary | ICD-10-CM | POA: Diagnosis not present

## 2022-09-16 NOTE — Assessment & Plan Note (Signed)
Under good control on current regimen. Continue current regimen. Continue to monitor. Call with any concerns. Refills up to date.   

## 2022-09-16 NOTE — Progress Notes (Signed)
BP 138/72   Pulse 62   Temp 97.9 F (36.6 C)   Wt 163 lb 9.6 oz (74.2 kg)   SpO2 94%   BMI 25.08 kg/m    Subjective:    Patient ID: Carlos Richard, male    DOB: Dec 19, 1931, 88 y.o.   MRN: [id number]  HPI: Carlos Richard is a 88 y.o. male  Chief Complaint  Patient presents with   Hypertension   Hyperlipidemia   Chronic Kidney Disease   HYPERTENSION / HYPERLIPIDEMIA- did not take his BP medicine this AM Satisfied with current treatment? yes Duration of hypertension: chronic BP monitoring frequency: not checking BP medication side effects: no Past BP meds: losartan, metoprolol, lasix Duration of hyperlipidemia: chronic Cholesterol medication side effects: no Cholesterol supplements: none Past cholesterol medications: simvastatin Medication compliance: excellent compliance Aspirin: no Recent stressors: no Recurrent headaches: no Visual changes: no Palpitations: no Dyspnea: no Chest pain: no Lower extremity edema: no Dizzy/lightheaded: no  Relevant past medical, surgical, family and social history reviewed and updated as indicated. Interim medical history since our last visit reviewed. Allergies and medications reviewed and updated.  Review of Systems  Constitutional: Negative.   Respiratory: Negative.    Cardiovascular: Negative.   Gastrointestinal: Negative.   Musculoskeletal: Negative.   Neurological: Negative.   Psychiatric/Behavioral: Negative.      Per HPI unless specifically indicated above     Objective:    BP 138/72   Pulse 62   Temp 97.9 F (36.6 C)   Wt 163 lb 9.6 oz (74.2 kg)   SpO2 94%   BMI 25.08 kg/m   Wt Readings from Last 3 Encounters:  09/16/22 163 lb 9.6 oz (74.2 kg)  06/23/22 162 lb 8 oz (73.7 kg)  06/18/22 163 lb (73.9 kg)    Physical Exam Vitals and nursing note reviewed.  Constitutional:      General: He is not in acute distress.    Appearance: Normal appearance. He is not ill-appearing, toxic-appearing or  diaphoretic.  HENT:     Head: Normocephalic and atraumatic.     Right Ear: External ear normal.     Left Ear: External ear normal.     Nose: Nose normal.     Mouth/Throat:     Mouth: Mucous membranes are moist.     Pharynx: Oropharynx is clear.  Eyes:     General: No scleral icterus.       Right eye: No discharge.        Left eye: No discharge.     Extraocular Movements: Extraocular movements intact.     Conjunctiva/sclera: Conjunctivae normal.     Pupils: Pupils are equal, round, and reactive to light.  Cardiovascular:     Rate and Rhythm: Normal rate and regular rhythm.     Pulses: Normal pulses.     Heart sounds: Normal heart sounds. No murmur heard.    No friction rub. No gallop.  Pulmonary:     Effort: Pulmonary effort is normal. No respiratory distress.     Breath sounds: Normal breath sounds. No stridor. No wheezing, rhonchi or rales.  Chest:     Chest wall: No tenderness.  Musculoskeletal:        General: Normal range of motion.     Cervical back: Normal range of motion and neck supple.  Skin:    General: Skin is warm and dry.     Capillary Refill: Capillary refill takes less than 2 seconds.     Coloration: Skin is  not jaundiced or pale.     Findings: No bruising, erythema, lesion or rash.  Neurological:     General: No focal deficit present.     Mental Status: He is alert and oriented to person, place, and time. Mental status is at baseline.  Psychiatric:        Mood and Affect: Mood normal.        Behavior: Behavior normal.        Thought Content: Thought content normal.        Judgment: Judgment normal.     Results for orders placed or performed in visit on 06/15/22  Comprehensive metabolic panel  Result Value Ref Range   Glucose 100 (H) 70 - 99 mg/dL   BUN 9 8 - 27 mg/dL   Creatinine, Ser 1.17 0.76 - 1.27 mg/dL   eGFR 60 >59 mL/min/1.73   BUN/Creatinine Ratio 8 (L) 10 - 24   Sodium 143 134 - 144 mmol/L   Potassium 4.2 3.5 - 5.2 mmol/L   Chloride 105  96 - 106 mmol/L   CO2 24 20 - 29 mmol/L   Calcium 9.3 8.6 - 10.2 mg/dL   Total Protein 6.4 6.0 - 8.5 g/dL   Albumin 4.2 3.7 - 4.7 g/dL   Globulin, Total 2.2 1.5 - 4.5 g/dL   Albumin/Globulin Ratio 1.9 1.2 - 2.2   Bilirubin Total 0.7 0.0 - 1.2 mg/dL   Alkaline Phosphatase 71 44 - 121 IU/L   AST 24 0 - 40 IU/L   ALT 19 0 - 44 IU/L  CBC with Differential/Platelet  Result Value Ref Range   WBC 8.8 3.4 - 10.8 x10E3/uL   RBC 4.59 4.14 - 5.80 x10E6/uL   Hemoglobin 14.4 13.0 - 17.7 g/dL   Hematocrit 42.7 37.5 - 51.0 %   MCV 93 79 - 97 fL   MCH 31.4 26.6 - 33.0 pg   MCHC 33.7 31.5 - 35.7 g/dL   RDW 13.2 11.6 - 15.4 %   Platelets 203 150 - 450 x10E3/uL   Neutrophils 47 Not Estab. %   Lymphs 39 Not Estab. %   Monocytes 8 Not Estab. %   Eos 5 Not Estab. %   Basos 1 Not Estab. %   Neutrophils Absolute 4.2 1.4 - 7.0 x10E3/uL   Lymphocytes Absolute 3.4 (H) 0.7 - 3.1 x10E3/uL   Monocytes Absolute 0.7 0.1 - 0.9 x10E3/uL   EOS (ABSOLUTE) 0.4 0.0 - 0.4 x10E3/uL   Basophils Absolute 0.1 0.0 - 0.2 x10E3/uL   Immature Granulocytes 0 Not Estab. %   Immature Grans (Abs) 0.0 0.0 - 0.1 x10E3/uL  Lipid Panel w/o Chol/HDL Ratio  Result Value Ref Range   Cholesterol, Total 134 100 - 199 mg/dL   Triglycerides 106 0 - 149 mg/dL   HDL 43 >39 mg/dL   VLDL Cholesterol Cal 20 5 - 40 mg/dL   LDL Chol Calc (NIH) 71 0 - 99 mg/dL  TSH  Result Value Ref Range   TSH 1.720 0.450 - 4.500 uIU/mL  Urinalysis, Routine w reflex microscopic  Result Value Ref Range   Specific Gravity, UA 1.020 1.005 - 1.030   pH, UA 7.5 5.0 - 7.5   Color, UA Yellow Yellow   Appearance Ur Clear Clear   Leukocytes,UA Negative Negative   Protein,UA Negative Negative/Trace   Glucose, UA Negative Negative   Ketones, UA Negative Negative   RBC, UA Negative Negative   Bilirubin, UA Negative Negative   Urobilinogen, Ur 0.2 0.2 - 1.0 mg/dL  Nitrite, UA Negative Negative  Microalbumin, Urine Waived  Result Value Ref Range    Microalb, Ur Waived 80 (H) 0 - 19 mg/L   Creatinine, Urine Waived 100 10 - 300 mg/dL   Microalb/Creat Ratio 30-300 (H) <30 mg/g  Uric acid  Result Value Ref Range   Uric Acid 6.4 3.8 - 8.4 mg/dL      Assessment & Plan:   Problem List Items Addressed This Visit       Genitourinary   Benign hypertensive renal disease - Primary    Under good control on current regimen. Continue current regimen. Continue to monitor. Call with any concerns. Refills up to date.          Other   Mixed hyperlipidemia    Under good control on current regimen. Continue current regimen. Continue to monitor. Call with any concerns. Refills up to date.       Relevant Medications   losartan (COZAAR) 25 MG tablet     Follow up plan: Return in about 3 months (around 12/17/2022).

## 2022-10-18 ENCOUNTER — Ambulatory Visit (INDEPENDENT_AMBULATORY_CARE_PROVIDER_SITE_OTHER): Payer: Medicare Other | Admitting: Nurse Practitioner

## 2022-10-18 ENCOUNTER — Encounter: Payer: Self-pay | Admitting: Nurse Practitioner

## 2022-10-18 VITALS — BP 140/80 | HR 70 | Temp 97.5°F | Ht 67.72 in | Wt 165.6 lb

## 2022-10-18 DIAGNOSIS — T148XXA Other injury of unspecified body region, initial encounter: Secondary | ICD-10-CM

## 2022-10-18 DIAGNOSIS — S41112A Laceration without foreign body of left upper arm, initial encounter: Secondary | ICD-10-CM | POA: Diagnosis not present

## 2022-10-18 NOTE — Progress Notes (Signed)
BP (!) 140/80   Pulse 70   Temp (!) 97.5 F (36.4 C) (Oral)   Ht 5' 7.72" (1.72 m)   Wt 165 lb 9.6 oz (75.1 kg)   BMI 25.39 kg/m    Subjective:    Patient ID: Carlos Richard, male    DOB: Apr 18, 1932, 88 y.o.   MRN: [id number]  HPI: Carlos Richard is a 88 y.o. male  Chief Complaint  Patient presents with   Bleeding abrasion     Left elbow,   SKIN ABRASION On Xarelto.  Recently, over past two days, had a bruise to left exterior upper arm above elbow -- this turned into skin tear over past day and started bleeding a lot which concerned wife. Duration: days Location: as above noted History of trauma in area: yes Pain: yes Quality: 2/10 Severity: mild Redness: no Swelling: no Oozing: no Pus: no Fevers: no Nausea/vomiting: no Status: stable Treatments attempted: pressure dressing at home   Tetanus: UTD   Relevant past medical, surgical, family and social history reviewed and updated as indicated. Interim medical history since our last visit reviewed. Allergies and medications reviewed and updated.  Review of Systems  Constitutional:  Negative for activity change, diaphoresis, fatigue and fever.  Respiratory:  Negative for cough, chest tightness, shortness of breath and wheezing.   Cardiovascular:  Negative for chest pain, palpitations and leg swelling.  Gastrointestinal: Negative.   Skin:  Positive for wound.  Neurological: Negative.   Psychiatric/Behavioral: Negative.     Per HPI unless specifically indicated above     Objective:    BP (!) 140/80   Pulse 70   Temp (!) 97.5 F (36.4 C) (Oral)   Ht 5' 7.72" (1.72 m)   Wt 165 lb 9.6 oz (75.1 kg)   BMI 25.39 kg/m   Wt Readings from Last 3 Encounters:  10/18/22 165 lb 9.6 oz (75.1 kg)  09/16/22 163 lb 9.6 oz (74.2 kg)  06/23/22 162 lb 8 oz (73.7 kg)    Physical Exam Vitals and nursing note reviewed.  Constitutional:      General: He is awake. He is not in acute distress.    Appearance: He is  well-developed and well-groomed. He is not ill-appearing or toxic-appearing.  HENT:     Head: Normocephalic and atraumatic.     Right Ear: Hearing normal. No drainage.     Left Ear: Hearing normal. No drainage.  Eyes:     General: Lids are normal.        Right eye: No discharge.        Left eye: No discharge.     Conjunctiva/sclera: Conjunctivae normal.     Pupils: Pupils are equal, round, and reactive to light.  Neck:     Vascular: No carotid bruit.  Cardiovascular:     Rate and Rhythm: Normal rate and regular rhythm.     Heart sounds: Normal heart sounds, S1 normal and S2 normal. No murmur heard.    No gallop.  Pulmonary:     Effort: Pulmonary effort is normal. No accessory muscle usage or respiratory distress.     Breath sounds: Normal breath sounds.  Abdominal:     General: Bowel sounds are normal.     Palpations: Abdomen is soft. There is no hepatomegaly or splenomegaly.  Musculoskeletal:        General: Normal range of motion.     Cervical back: Normal range of motion and neck supple.     Right lower leg:  No edema.     Left lower leg: No edema.  Skin:    General: Skin is warm and dry.     Capillary Refill: Capillary refill takes less than 2 seconds.     Findings: Abrasion and bruising present.       Neurological:     Mental Status: He is alert and oriented to person, place, and time.  Psychiatric:        Attention and Perception: Attention normal.        Mood and Affect: Mood normal.        Speech: Speech normal.        Behavior: Behavior normal. Behavior is cooperative.        Thought Content: Thought content normal.    Results for orders placed or performed in visit on 06/15/22  Comprehensive metabolic panel  Result Value Ref Range   Glucose 100 (H) 70 - 99 mg/dL   BUN 9 8 - 27 mg/dL   Creatinine, Ser 1.17 0.76 - 1.27 mg/dL   eGFR 60 >59 mL/min/1.73   BUN/Creatinine Ratio 8 (L) 10 - 24   Sodium 143 134 - 144 mmol/L   Potassium 4.2 3.5 - 5.2 mmol/L    Chloride 105 96 - 106 mmol/L   CO2 24 20 - 29 mmol/L   Calcium 9.3 8.6 - 10.2 mg/dL   Total Protein 6.4 6.0 - 8.5 g/dL   Albumin 4.2 3.7 - 4.7 g/dL   Globulin, Total 2.2 1.5 - 4.5 g/dL   Albumin/Globulin Ratio 1.9 1.2 - 2.2   Bilirubin Total 0.7 0.0 - 1.2 mg/dL   Alkaline Phosphatase 71 44 - 121 IU/L   AST 24 0 - 40 IU/L   ALT 19 0 - 44 IU/L  CBC with Differential/Platelet  Result Value Ref Range   WBC 8.8 3.4 - 10.8 x10E3/uL   RBC 4.59 4.14 - 5.80 x10E6/uL   Hemoglobin 14.4 13.0 - 17.7 g/dL   Hematocrit 42.7 37.5 - 51.0 %   MCV 93 79 - 97 fL   MCH 31.4 26.6 - 33.0 pg   MCHC 33.7 31.5 - 35.7 g/dL   RDW 13.2 11.6 - 15.4 %   Platelets 203 150 - 450 x10E3/uL   Neutrophils 47 Not Estab. %   Lymphs 39 Not Estab. %   Monocytes 8 Not Estab. %   Eos 5 Not Estab. %   Basos 1 Not Estab. %   Neutrophils Absolute 4.2 1.4 - 7.0 x10E3/uL   Lymphocytes Absolute 3.4 (H) 0.7 - 3.1 x10E3/uL   Monocytes Absolute 0.7 0.1 - 0.9 x10E3/uL   EOS (ABSOLUTE) 0.4 0.0 - 0.4 x10E3/uL   Basophils Absolute 0.1 0.0 - 0.2 x10E3/uL   Immature Granulocytes 0 Not Estab. %   Immature Grans (Abs) 0.0 0.0 - 0.1 x10E3/uL  Lipid Panel w/o Chol/HDL Ratio  Result Value Ref Range   Cholesterol, Total 134 100 - 199 mg/dL   Triglycerides 106 0 - 149 mg/dL   HDL 43 >39 mg/dL   VLDL Cholesterol Cal 20 5 - 40 mg/dL   LDL Chol Calc (NIH) 71 0 - 99 mg/dL  TSH  Result Value Ref Range   TSH 1.720 0.450 - 4.500 uIU/mL  Urinalysis, Routine w reflex microscopic  Result Value Ref Range   Specific Gravity, UA 1.020 1.005 - 1.030   pH, UA 7.5 5.0 - 7.5   Color, UA Yellow Yellow   Appearance Ur Clear Clear   Leukocytes,UA Negative Negative   Protein,UA Negative Negative/Trace  Glucose, UA Negative Negative   Ketones, UA Negative Negative   RBC, UA Negative Negative   Bilirubin, UA Negative Negative   Urobilinogen, Ur 0.2 0.2 - 1.0 mg/dL   Nitrite, UA Negative Negative  Microalbumin, Urine Waived  Result Value Ref  Range   Microalb, Ur Waived 80 (H) 0 - 19 mg/L   Creatinine, Urine Waived 100 10 - 300 mg/dL   Microalb/Creat Ratio 30-300 (H) <30 mg/g  Uric acid  Result Value Ref Range   Uric Acid 6.4 3.8 - 8.4 mg/dL      Assessment & Plan:   Problem List Items Addressed This Visit   None Visit Diagnoses     Bruise    -  Primary   To left upper arm, skin tear present.  Wound care performed today and instructed wife on how to perform at home.  Return if worsening or ongoing issues.   Skin tear of left upper arm without complication, initial encounter       Wife at bedside, instructed on wound care.  No bleeding noted.  No s/s infection.  Wound care to be performed at home and if any worsening or ongoing return.        Follow up plan: Return if symptoms worsen or fail to improve.

## 2022-10-18 NOTE — Patient Instructions (Signed)
Skin Tear A skin tear is a wound in which the top layers of skin peel off. This is a common problem for older people. It can also be a problem for people who take certain medicines for too long. To repair the skin, your doctor may use: Tape. Skin tape (adhesive) strips. A bandage (dressing) may also be placed over the tape or skin tape strips. Follow these instructions at home: Keep your wound clean Clean the wound as told by your doctor. You may be told to keep the wound dry for the first few days. If you are told to clean the wound: Wash the wound with mild soap and water, a wound cleanser, or a salt-water (saline) solution. If you use soap, rinse the wound with water to get all the soap off. Do not rub the wound dry. Pat the wound gently with a clean towel or let it air-dry. Change any bandage as told by your doctor. This includes changing the bandage if it gets wet, gets dirty, or starts to smell bad. To change your bandage: Wash your hands with soap and water for at least 20 seconds before and after you change your bandage. If you cannot use soap and water, use hand sanitizer. Leave tape or skin tape strips alone unless you are told to take them off. You may trim the edges of the tape strips if they curl up. Watch for signs of infection Check your wound every day for signs of infection. Check for: Redness, swelling, or pain. More fluid or blood. Warmth. Pus or a bad smell.  Protect your wound Do not scratch or pick at the wound. Protect the injured area until it has healed. Medicines Take or apply over-the-counter and prescription medicines only as told by your doctor. If you were prescribed an antibiotic medicine, take or apply it as told by your doctor. Do not stop using it even if your condition gets better. General instructions  Keep the bandage dry. Do not take baths, swim, use a hot tub, or do anything that puts your wound underwater. Ask your doctor about taking showers or  sponge baths. Keep all follow-up visits. Contact a doctor if: You have any of these signs of infection in your wound: Redness, swelling, or pain. More fluid or blood. Warmth. Pus or a bad smell. Get help right away if: You have a red streak that goes away from the skin tear. You have a fever and chills, and your symptoms get worse all of a sudden. Summary A skin tear is a wound in which the top layers of skin peel off. To repair the skin, your doctor may use tape or skin tape strips. Change any bandage as told by your doctor. Take or apply over-the-counter and prescription medicines only as told by your doctor. Contact a doctor if you have signs of infection. This information is not intended to replace advice given to you by your health care provider. Make sure you discuss any questions you have with your health care provider. Document Revised: 02/06/2020 Document Reviewed: 02/06/2020 Elsevier Patient Education  2023 [employer]  

## 2022-12-14 ENCOUNTER — Ambulatory Visit: Payer: Medicare Other | Admitting: Family Medicine

## 2022-12-14 ENCOUNTER — Encounter: Payer: Self-pay | Admitting: Family Medicine

## 2022-12-14 VITALS — BP 146/80 | HR 66 | Temp 98.2°F | Ht 67.0 in | Wt 166.8 lb

## 2022-12-14 DIAGNOSIS — T17320A Food in larynx causing asphyxiation, initial encounter: Secondary | ICD-10-CM

## 2022-12-14 DIAGNOSIS — I129 Hypertensive chronic kidney disease with stage 1 through stage 4 chronic kidney disease, or unspecified chronic kidney disease: Secondary | ICD-10-CM

## 2022-12-14 DIAGNOSIS — E782 Mixed hyperlipidemia: Secondary | ICD-10-CM | POA: Diagnosis not present

## 2022-12-14 DIAGNOSIS — W44F3XA Food entering into or through a natural orifice, initial encounter: Secondary | ICD-10-CM

## 2022-12-14 DIAGNOSIS — M1A9XX1 Chronic gout, unspecified, with tophus (tophi): Secondary | ICD-10-CM

## 2022-12-14 MED ORDER — ALLOPURINOL 100 MG PO TABS: 100.0000 mg | ORAL_TABLET | Freq: Every day | ORAL | 1 refills | Status: DC

## 2022-12-14 MED ORDER — FLUTICASONE-SALMETEROL 250-50 MCG/ACT IN AEPB: INHALATION_SPRAY | RESPIRATORY_TRACT | 1 refills | Status: DC

## 2022-12-14 MED ORDER — XARELTO 15 MG PO TABS: 15.0000 mg | ORAL_TABLET | Freq: Every day | ORAL | 3 refills | Status: DC

## 2022-12-14 MED ORDER — SIMVASTATIN 20 MG PO TABS: 20.0000 mg | ORAL_TABLET | Freq: Every day | ORAL | 1 refills | Status: DC

## 2022-12-14 NOTE — Progress Notes (Signed)
BP (!) 146/80 (BP Location: Left Arm, Cuff Size: Normal)   Pulse 66   Temp 98.2 F (36.8 C) (Oral)   Ht 5\' 7"  (1.702 m)   Wt 166 lb 12.8 oz (75.7 kg)   SpO2 96%   BMI 26.12 kg/m    Subjective:    Patient ID: Carlos Richard, male    DOB: 1932-11-02, 89 y.o.   MRN: [id number]  HPI: Carlos Richard is a 89 y.o. male  Chief Complaint  Patient presents with   Nausea    Patient wife says the patient has baked chicken for lunch and says she is not sure if he swallowed wrong and started to have the hiccups and then started to cough up white phlegm. Patient says the patient has been able to since get some water down and swallowing perfect fine.    DYSPHAGIA Duration:  today Description of symptom: things get stuck- happened today, hasn't really happened before Onset: Several seconds after swallowing Location of dysphagia: chest Dysphagia to solids only: yes Dysphagia to solids & liquids: no  Frequency:once  Progressively getting worse: no Alleviatiating factors: drinking water Provoking factors: eating some chicken today Status: better EGD: no Weight loss: no Sensation of lump in throat: no Heartburn: no Odynophagia: no Nausea: yes Vomiting: no Drooling/nasal regurgitation/food spillage: no Coughing/choking/dysphonia: no Dysarthria: no Hematemesis: no Regurgitation of undigested food/halitosis: no Chest pain: no  No gout flares.   HYPERTENSION / HYPERLIPIDEMIA Satisfied with current treatment? yes Duration of hypertension: chronic BP monitoring frequency: not checking BP medication side effects: no Past BP meds: losartan, metoprolol, lasix Duration of hyperlipidemia: chronic Cholesterol medication side effects: no Cholesterol supplements: none Past cholesterol medications: simvastatin Medication compliance: excellent compliance Aspirin: no Recent stressors: no Recurrent headaches: no Visual changes: no Palpitations: no Dyspnea: no Chest pain: no Lower  extremity edema: no Dizzy/lightheaded: no  Relevant past medical, surgical, family and social history reviewed and updated as indicated. Interim medical history since our last visit reviewed. Allergies and medications reviewed and updated.  Review of Systems  Constitutional: Negative.   Respiratory: Negative.    Cardiovascular: Negative.   Gastrointestinal: Negative.   Musculoskeletal: Negative.   Psychiatric/Behavioral: Negative.      Per HPI unless specifically indicated above     Objective:    BP (!) 146/80 (BP Location: Left Arm, Cuff Size: Normal)   Pulse 66   Temp 98.2 F (36.8 C) (Oral)   Ht 5\' 7"  (1.702 m)   Wt 166 lb 12.8 oz (75.7 kg)   SpO2 96%   BMI 26.12 kg/m   Wt Readings from Last 3 Encounters:  12/14/22 166 lb 12.8 oz (75.7 kg)  10/18/22 165 lb 9.6 oz (75.1 kg)  09/16/22 163 lb 9.6 oz (74.2 kg)    Physical Exam Vitals and nursing note reviewed.  Constitutional:      General: He is not in acute distress.    Appearance: Normal appearance. He is not ill-appearing, toxic-appearing or diaphoretic.  HENT:     Head: Normocephalic and atraumatic.     Right Ear: External ear normal.     Left Ear: External ear normal.     Nose: Nose normal.     Mouth/Throat:     Mouth: Mucous membranes are moist.     Pharynx: Oropharynx is clear.  Eyes:     General: No scleral icterus.       Right eye: No discharge.        Left eye: No discharge.  Extraocular Movements: Extraocular movements intact.     Conjunctiva/sclera: Conjunctivae normal.     Pupils: Pupils are equal, round, and reactive to light.  Cardiovascular:     Rate and Rhythm: Normal rate and regular rhythm.     Pulses: Normal pulses.     Heart sounds: Normal heart sounds. No murmur heard.    No friction rub. No gallop.  Pulmonary:     Effort: Pulmonary effort is normal. No respiratory distress.     Breath sounds: Normal breath sounds. No stridor. No wheezing, rhonchi or rales.  Chest:     Chest  wall: No tenderness.  Musculoskeletal:        General: Normal range of motion.     Cervical back: Normal range of motion and neck supple.  Skin:    General: Skin is warm and dry.     Capillary Refill: Capillary refill takes less than 2 seconds.     Coloration: Skin is not jaundiced or pale.     Findings: No bruising, erythema, lesion or rash.  Neurological:     General: No focal deficit present.     Mental Status: He is alert and oriented to person, place, and time. Mental status is at baseline.  Psychiatric:        Mood and Affect: Mood normal.        Behavior: Behavior normal.        Thought Content: Thought content normal.        Judgment: Judgment normal.     Results for orders placed or performed in visit on 06/15/22  Comprehensive metabolic panel  Result Value Ref Range   Glucose 100 (H) 70 - 99 mg/dL   BUN 9 8 - 27 mg/dL   Creatinine, Ser 1.17 0.76 - 1.27 mg/dL   eGFR 60 >59 mL/min/1.73   BUN/Creatinine Ratio 8 (L) 10 - 24   Sodium 143 134 - 144 mmol/L   Potassium 4.2 3.5 - 5.2 mmol/L   Chloride 105 96 - 106 mmol/L   CO2 24 20 - 29 mmol/L   Calcium 9.3 8.6 - 10.2 mg/dL   Total Protein 6.4 6.0 - 8.5 g/dL   Albumin 4.2 3.7 - 4.7 g/dL   Globulin, Total 2.2 1.5 - 4.5 g/dL   Albumin/Globulin Ratio 1.9 1.2 - 2.2   Bilirubin Total 0.7 0.0 - 1.2 mg/dL   Alkaline Phosphatase 71 44 - 121 IU/L   AST 24 0 - 40 IU/L   ALT 19 0 - 44 IU/L  CBC with Differential/Platelet  Result Value Ref Range   WBC 8.8 3.4 - 10.8 x10E3/uL   RBC 4.59 4.14 - 5.80 x10E6/uL   Hemoglobin 14.4 13.0 - 17.7 g/dL   Hematocrit 42.7 37.5 - 51.0 %   MCV 93 79 - 97 fL   MCH 31.4 26.6 - 33.0 pg   MCHC 33.7 31.5 - 35.7 g/dL   RDW 13.2 11.6 - 15.4 %   Platelets 203 150 - 450 x10E3/uL   Neutrophils 47 Not Estab. %   Lymphs 39 Not Estab. %   Monocytes 8 Not Estab. %   Eos 5 Not Estab. %   Basos 1 Not Estab. %   Neutrophils Absolute 4.2 1.4 - 7.0 x10E3/uL   Lymphocytes Absolute 3.4 (H) 0.7 - 3.1  x10E3/uL   Monocytes Absolute 0.7 0.1 - 0.9 x10E3/uL   EOS (ABSOLUTE) 0.4 0.0 - 0.4 x10E3/uL   Basophils Absolute 0.1 0.0 - 0.2 x10E3/uL   Immature Granulocytes 0 Not Estab. %  Immature Grans (Abs) 0.0 0.0 - 0.1 x10E3/uL  Lipid Panel w/o Chol/HDL Ratio  Result Value Ref Range   Cholesterol, Total 134 100 - 199 mg/dL   Triglycerides 106 0 - 149 mg/dL   HDL 43 >39 mg/dL   VLDL Cholesterol Cal 20 5 - 40 mg/dL   LDL Chol Calc (NIH) 71 0 - 99 mg/dL  TSH  Result Value Ref Range   TSH 1.720 0.450 - 4.500 uIU/mL  Urinalysis, Routine w reflex microscopic  Result Value Ref Range   Specific Gravity, UA 1.020 1.005 - 1.030   pH, UA 7.5 5.0 - 7.5   Color, UA Yellow Yellow   Appearance Ur Clear Clear   Leukocytes,UA Negative Negative   Protein,UA Negative Negative/Trace   Glucose, UA Negative Negative   Ketones, UA Negative Negative   RBC, UA Negative Negative   Bilirubin, UA Negative Negative   Urobilinogen, Ur 0.2 0.2 - 1.0 mg/dL   Nitrite, UA Negative Negative  Microalbumin, Urine Waived  Result Value Ref Range   Microalb, Ur Waived 80 (H) 0 - 19 mg/L   Creatinine, Urine Waived 100 10 - 300 mg/dL   Microalb/Creat Ratio 30-300 (H) <30 mg/g  Uric acid  Result Value Ref Range   Uric Acid 6.4 3.8 - 8.4 mg/dL      Assessment & Plan:   Problem List Items Addressed This Visit       Genitourinary   Benign hypertensive renal disease - Primary    Under good control on current regimen. Continue current regimen. Continue to monitor. Call with any concerns. Refills given. Labs drawn today.        Relevant Orders   CBC with Differential/Platelet   Comprehensive metabolic panel     Other   Gout    Under good control on current regimen. Continue current regimen. Continue to monitor. Call with any concerns. Refills given. Labs drawn today.       Relevant Orders   CBC with Differential/Platelet   Comprehensive metabolic panel   Uric acid   Mixed hyperlipidemia    Under good  control on current regimen. Continue current regimen. Continue to monitor. Call with any concerns. Refills given. Labs drawn today.       Relevant Medications   simvastatin (ZOCOR) 20 MG tablet   XARELTO 15 MG TABS tablet   Other Relevant Orders   CBC with Differential/Platelet   Comprehensive metabolic panel   Lipid Panel w/o Chol/HDL Ratio   Other Visit Diagnoses     Choking due to food in larynx, initial encounter       Resolved. Will cut his food smaller and take longer to eat. Continue to monitor. Call with any concerns.        Follow up plan: Return in about 3 months (around 03/15/2023) for OK to cancel appt for next week.

## 2022-12-14 NOTE — Assessment & Plan Note (Signed)
Under good control on current regimen. Continue current regimen. Continue to monitor. Call with any concerns. Refills given. Labs drawn today.   

## 2022-12-15 LAB — COMPREHENSIVE METABOLIC PANEL
ALT: 19 IU/L (ref 0–44)
AST: 24 IU/L (ref 0–40)
Albumin/Globulin Ratio: 2.1 (ref 1.2–2.2)
Albumin: 4.4 g/dL (ref 3.7–4.7)
Alkaline Phosphatase: 76 IU/L (ref 44–121)
BUN/Creatinine Ratio: 9 — ABNORMAL LOW (ref 10–24)
BUN: 10 mg/dL (ref 8–27)
Bilirubin Total: 0.5 mg/dL (ref 0.0–1.2)
CO2: 26 mmol/L (ref 20–29)
Calcium: 9.3 mg/dL (ref 8.6–10.2)
Chloride: 104 mmol/L (ref 96–106)
Creatinine, Ser: 1.16 mg/dL (ref 0.76–1.27)
Globulin, Total: 2.1 g/dL (ref 1.5–4.5)
Glucose: 107 mg/dL — ABNORMAL HIGH (ref 70–99)
Potassium: 4.2 mmol/L (ref 3.5–5.2)
Sodium: 142 mmol/L (ref 134–144)
Total Protein: 6.5 g/dL (ref 6.0–8.5)
eGFR: 61 mL/min/{1.73_m2} (ref >59)

## 2022-12-15 LAB — CBC WITH DIFFERENTIAL/PLATELET
Basophils Absolute: 0.1 10*3/uL (ref 0.0–0.2)
Basos: 1 %
EOS (ABSOLUTE): 0.3 10*3/uL (ref 0.0–0.4)
Eos: 4 %
Hematocrit: 43.6 % (ref 37.5–51.0)
Hemoglobin: 14.7 g/dL (ref 13.0–17.7)
Immature Grans (Abs): 0 10*3/uL (ref 0.0–0.1)
Immature Granulocytes: 0 %
Lymphocytes Absolute: 2.7 10*3/uL (ref 0.7–3.1)
Lymphs: 33 %
MCH: 30.6 pg (ref 26.6–33.0)
MCHC: 33.7 g/dL (ref 31.5–35.7)
MCV: 91 fL (ref 79–97)
Monocytes Absolute: 0.6 10*3/uL (ref 0.1–0.9)
Monocytes: 7 %
Neutrophils Absolute: 4.6 10*3/uL (ref 1.4–7.0)
Neutrophils: 55 %
Platelets: 206 10*3/uL (ref 150–450)
RBC: 4.81 x10E6/uL (ref 4.14–5.80)
RDW: 12.8 % (ref 11.6–15.4)
WBC: 8.2 10*3/uL (ref 3.4–10.8)

## 2022-12-15 LAB — LIPID PANEL W/O CHOL/HDL RATIO
Cholesterol, Total: 144 mg/dL (ref 100–199)
HDL: 39 mg/dL — ABNORMAL LOW (ref >39)
LDL Chol Calc (NIH): 77 mg/dL (ref 0–99)
Triglycerides: 162 mg/dL — ABNORMAL HIGH (ref 0–149)
VLDL Cholesterol Cal: 28 mg/dL (ref 5–40)

## 2022-12-15 LAB — URIC ACID: Uric Acid: 6 mg/dL (ref 3.8–8.4)

## 2022-12-17 ENCOUNTER — Ambulatory Visit: Payer: Medicare Other | Admitting: Family Medicine

## 2022-12-22 ENCOUNTER — Ambulatory Visit: Payer: Medicare Other | Admitting: Family Medicine

## 2023-01-18 ENCOUNTER — Other Ambulatory Visit: Payer: Self-pay | Admitting: Cardiovascular Disease

## 2023-01-25 ENCOUNTER — Telehealth: Payer: Self-pay | Admitting: Family Medicine

## 2023-01-25 NOTE — Telephone Encounter (Signed)
Contacted Jearld Pies to schedule their annual wellness visit. Call back at later date: 01/27/2023  *Sherol Dade; [facility] Direct Dial: [phone]

## 2023-02-14 ENCOUNTER — Ambulatory Visit: Payer: Medicare Other | Admitting: Family Medicine

## 2023-02-14 ENCOUNTER — Encounter: Payer: Self-pay | Admitting: Family Medicine

## 2023-02-14 VITALS — BP 160/72 | HR 63 | Temp 97.9°F | Wt 166.0 lb

## 2023-02-14 DIAGNOSIS — G3184 Mild cognitive impairment, so stated: Secondary | ICD-10-CM | POA: Diagnosis not present

## 2023-02-14 NOTE — Patient Instructions (Signed)
Continue to have daily routine Start doing more brain exercises such as crossword puzzles, sodoku, or puzzles.

## 2023-02-14 NOTE — Progress Notes (Signed)
BP (!) 160/72 (BP Location: Left Arm)   Pulse 63   Temp 97.9 F (36.6 C) (Oral)   Wt 166 lb (75.3 kg)   SpO2 91%   BMI 26.00 kg/m    Subjective:    Patient ID: Carlos Richard, male    DOB: 1932-05-03, 89 y.o.   MRN: [id number]  HPI: Carlos Richard is a 89 y.o. male  Chief Complaint  Patient presents with   Memory Loss   Blood pressure elevated after recheck 160/72, he has not taken BP medications this morning. Pulse oximetry 91% today, COPD history, denies SOB, remains on Advair daily with no complaints.   MEMORY LOSS  His wife admits two years ago she started to notice a progressive decline in the patients long term memory, such as remembering events that occurred in the past. She states he is not able to remember when he fell and fractured his left arm. He was previously taking Neuriva, a brain health vitamin for one month but states this makes him sluggish in activities, so stopped taking it. He is able to do his ADL's with minimal assistance. Had a little trouble remembering some activities he did yesterday but did okay with cues from wife. He is able to order food when eating out, participating in hobbies such as yard work and playing word games together with his wife. He is not much of a talker in social groups.   Duration: Progressive over 2 year span Orientation: Alert and oriented x4 Confusion: None Recent Falls: No Trauma: No Delusions: No Hallucinations: No Forgetting Names: no Urinary symptoms: none MMSE score: 26 6CIT score: 2     02/14/2023    8:51 AM  MMSE - Mini Mental State Exam  Orientation to time 5  Orientation to Place 5  Registration 3  Attention/ Calculation 3  Recall 1  Language- name 2 objects 2  Language- repeat 1  Language- follow 3 step command 3  Language- read & follow direction 1  Write a sentence 1  Copy design 1  Total score 26       02/14/2023    9:10 AM 02/17/2022   10:19 AM 06/01/2021    3:39 PM 02/16/2021    2:38 PM  02/04/2020   10:03 AM  6CIT Screen  What Year? 0 points 0 points 0 points 0 points 0 points  What month? 0 points 0 points 0 points 0 points 0 points  What time? 0 points 0 points 0 points 0 points 0 points  Count back from 20 0 points 0 points 0 points 0 points 0 points  Months in reverse 2 points 4 points 2 points 4 points 0 points  Repeat phrase 0 points 0 points 4 points 6 points 0 points  Total Score 2 points 4 points 6 points 10 points 0 points   Relevant past medical, surgical, family and social history reviewed and updated as indicated. Interim medical history since our last visit reviewed. Allergies and medications reviewed and updated.  Review of Systems  Constitutional: Negative.   HENT: Negative.    Eyes: Negative.   Respiratory: Negative.    Cardiovascular: Negative.   Gastrointestinal: Negative.   Genitourinary:  Negative for difficulty urinating, dysuria, flank pain and urgency.  Musculoskeletal: Negative.   Skin: Negative.   Neurological:  Positive for tremors. Negative for dizziness, seizures, syncope, facial asymmetry, speech difficulty, weakness, light-headedness, numbness and headaches.  Psychiatric/Behavioral: Negative.      Per HPI unless specifically indicated  above     Objective:    BP (!) 160/72 (BP Location: Left Arm)   Pulse 63   Temp 97.9 F (36.6 C) (Oral)   Wt 166 lb (75.3 kg)   SpO2 91%   BMI 26.00 kg/m   Wt Readings from Last 3 Encounters:  02/14/23 166 lb (75.3 kg)  12/14/22 166 lb 12.8 oz (75.7 kg)  10/18/22 165 lb 9.6 oz (75.1 kg)    Physical Exam  Results for orders placed or performed in visit on 12/14/22  CBC with Differential/Platelet  Result Value Ref Range   WBC 8.2 3.4 - 10.8 x10E3/uL   RBC 4.81 4.14 - 5.80 x10E6/uL   Hemoglobin 14.7 13.0 - 17.7 g/dL   Hematocrit 43.6 37.5 - 51.0 %   MCV 91 79 - 97 fL   MCH 30.6 26.6 - 33.0 pg   MCHC 33.7 31.5 - 35.7 g/dL   RDW 12.8 11.6 - 15.4 %   Platelets 206 150 - 450 x10E3/uL    Neutrophils 55 Not Estab. %   Lymphs 33 Not Estab. %   Monocytes 7 Not Estab. %   Eos 4 Not Estab. %   Basos 1 Not Estab. %   Neutrophils Absolute 4.6 1.4 - 7.0 x10E3/uL   Lymphocytes Absolute 2.7 0.7 - 3.1 x10E3/uL   Monocytes Absolute 0.6 0.1 - 0.9 x10E3/uL   EOS (ABSOLUTE) 0.3 0.0 - 0.4 x10E3/uL   Basophils Absolute 0.1 0.0 - 0.2 x10E3/uL   Immature Granulocytes 0 Not Estab. %   Immature Grans (Abs) 0.0 0.0 - 0.1 x10E3/uL  Comprehensive metabolic panel  Result Value Ref Range   Glucose 107 (H) 70 - 99 mg/dL   BUN 10 8 - 27 mg/dL   Creatinine, Ser 1.16 0.76 - 1.27 mg/dL   eGFR 61 >59 mL/min/1.73   BUN/Creatinine Ratio 9 (L) 10 - 24   Sodium 142 134 - 144 mmol/L   Potassium 4.2 3.5 - 5.2 mmol/L   Chloride 104 96 - 106 mmol/L   CO2 26 20 - 29 mmol/L   Calcium 9.3 8.6 - 10.2 mg/dL   Total Protein 6.5 6.0 - 8.5 g/dL   Albumin 4.4 3.7 - 4.7 g/dL   Globulin, Total 2.1 1.5 - 4.5 g/dL   Albumin/Globulin Ratio 2.1 1.2 - 2.2   Bilirubin Total 0.5 0.0 - 1.2 mg/dL   Alkaline Phosphatase 76 44 - 121 IU/L   AST 24 0 - 40 IU/L   ALT 19 0 - 44 IU/L  Lipid Panel w/o Chol/HDL Ratio  Result Value Ref Range   Cholesterol, Total 144 100 - 199 mg/dL   Triglycerides 162 (H) 0 - 149 mg/dL   HDL 39 (L) >39 mg/dL   VLDL Cholesterol Cal 28 5 - 40 mg/dL   LDL Chol Calc (NIH) 77 0 - 99 mg/dL  Uric acid  Result Value Ref Range   Uric Acid 6.0 3.8 - 8.4 mg/dL      Assessment & Plan:   Problem List Items Addressed This Visit   None Visit Diagnoses     Mild cognitive impairment    -  Primary   Chronic, ongoing. Educated on the importance of a daily routine and brain exercises such as activities in puzzle book. F/u in 1 month or if symptoms worsen.        Follow up plan: Return in about 1 week (around 02/21/2023) for Annual wellness visit with RN.

## 2023-02-20 ENCOUNTER — Emergency Department: Payer: Medicare Other

## 2023-02-20 ENCOUNTER — Other Ambulatory Visit: Payer: Self-pay

## 2023-02-20 ENCOUNTER — Observation Stay
Admission: EM | Admit: 2023-02-20 | Discharge: 2023-02-21 | Disposition: A | Discharge status: Home / Self Care | Payer: Medicare Other | Attending: Internal Medicine | Admitting: Internal Medicine

## 2023-02-20 DIAGNOSIS — Z79899 Other long term (current) drug therapy: Secondary | ICD-10-CM | POA: Diagnosis not present

## 2023-02-20 DIAGNOSIS — Z9682 Presence of neurostimulator: Secondary | ICD-10-CM | POA: Diagnosis not present

## 2023-02-20 DIAGNOSIS — Z8673 Personal history of transient ischemic attack (TIA), and cerebral infarction without residual deficits: Secondary | ICD-10-CM | POA: Insufficient documentation

## 2023-02-20 DIAGNOSIS — W19XXXA Unspecified fall, initial encounter: Secondary | ICD-10-CM | POA: Diagnosis not present

## 2023-02-20 DIAGNOSIS — J449 Chronic obstructive pulmonary disease, unspecified: Secondary | ICD-10-CM | POA: Insufficient documentation

## 2023-02-20 DIAGNOSIS — I129 Hypertensive chronic kidney disease with stage 1 through stage 4 chronic kidney disease, or unspecified chronic kidney disease: Secondary | ICD-10-CM | POA: Diagnosis not present

## 2023-02-20 DIAGNOSIS — M109 Gout, unspecified: Secondary | ICD-10-CM | POA: Diagnosis present

## 2023-02-20 DIAGNOSIS — J439 Emphysema, unspecified: Secondary | ICD-10-CM

## 2023-02-20 DIAGNOSIS — Z7951 Long term (current) use of inhaled steroids: Secondary | ICD-10-CM | POA: Insufficient documentation

## 2023-02-20 DIAGNOSIS — Z87891 Personal history of nicotine dependence: Secondary | ICD-10-CM | POA: Insufficient documentation

## 2023-02-20 DIAGNOSIS — Z7901 Long term (current) use of anticoagulants: Secondary | ICD-10-CM | POA: Insufficient documentation

## 2023-02-20 DIAGNOSIS — N1831 Chronic kidney disease, stage 3a: Secondary | ICD-10-CM | POA: Insufficient documentation

## 2023-02-20 DIAGNOSIS — Z743 Need for continuous supervision: Secondary | ICD-10-CM | POA: Diagnosis not present

## 2023-02-20 DIAGNOSIS — I482 Chronic atrial fibrillation, unspecified: Secondary | ICD-10-CM | POA: Diagnosis not present

## 2023-02-20 DIAGNOSIS — R55 Syncope and collapse: Secondary | ICD-10-CM | POA: Diagnosis not present

## 2023-02-20 DIAGNOSIS — R0902 Hypoxemia: Secondary | ICD-10-CM | POA: Diagnosis not present

## 2023-02-20 DIAGNOSIS — R404 Transient alteration of awareness: Secondary | ICD-10-CM | POA: Diagnosis not present

## 2023-02-20 DIAGNOSIS — G250 Essential tremor: Secondary | ICD-10-CM | POA: Diagnosis present

## 2023-02-20 DIAGNOSIS — R6889 Other general symptoms and signs: Secondary | ICD-10-CM | POA: Diagnosis not present

## 2023-02-20 DIAGNOSIS — I10 Essential (primary) hypertension: Secondary | ICD-10-CM | POA: Diagnosis present

## 2023-02-20 DIAGNOSIS — E785 Hyperlipidemia, unspecified: Secondary | ICD-10-CM | POA: Diagnosis present

## 2023-02-20 DIAGNOSIS — M47812 Spondylosis without myelopathy or radiculopathy, cervical region: Secondary | ICD-10-CM | POA: Diagnosis not present

## 2023-02-20 LAB — CBC
HCT: 45.4 % (ref 39.0–52.0)
Hemoglobin: 14.8 g/dL (ref 13.0–17.0)
MCH: 30.2 pg (ref 26.0–34.0)
MCHC: 32.6 g/dL (ref 30.0–36.0)
MCV: 92.7 fL (ref 80.0–100.0)
Platelets: 211 10*3/uL (ref 150–400)
RBC: 4.9 MIL/uL (ref 4.22–5.81)
RDW: 13 % (ref 11.5–15.5)
WBC: 8.9 10*3/uL (ref 4.0–10.5)
nRBC: 0.2 % (ref 0.0–0.2)

## 2023-02-20 LAB — TROPONIN I (HIGH SENSITIVITY)
Troponin I (High Sensitivity): 9 ng/L (ref <18)
Troponin I (High Sensitivity): 6 ng/L (ref <18)

## 2023-02-20 LAB — BASIC METABOLIC PANEL
Anion gap: 8 (ref 5–15)
BUN: 10 mg/dL (ref 8–23)
CO2: 27 mmol/L (ref 22–32)
Calcium: 8.8 mg/dL — ABNORMAL LOW (ref 8.9–10.3)
Chloride: 106 mmol/L (ref 98–111)
Creatinine, Ser: 1.09 mg/dL (ref 0.61–1.24)
GFR, Estimated: >60 mL/min (ref >60)
Glucose, Bld: 113 mg/dL — ABNORMAL HIGH (ref 70–99)
Potassium: 4 mmol/L (ref 3.5–5.1)
Sodium: 141 mmol/L (ref 135–145)

## 2023-02-20 MED ORDER — ONDANSETRON HCL 4 MG/2ML IJ SOLN
4.0000 mg | Freq: Three times a day (TID) | INTRAMUSCULAR | Status: DC | PRN
  Administered 2023-02-20: 4 mg via INTRAVENOUS
  Filled 2023-02-20: qty 2

## 2023-02-20 MED ORDER — MAGNESIUM OXIDE 400 MG PO TABS
400.0000 mg | ORAL_TABLET | Freq: Every day | ORAL | Status: DC
  Administered 2023-02-20 – 2023-02-21 (×2): 400 mg via ORAL
  Filled 2023-02-20 (×3): qty 1

## 2023-02-20 MED ORDER — DM-GUAIFENESIN ER 30-600 MG PO TB12: 1.0000 | ORAL_TABLET | Freq: Two times a day (BID) | ORAL | Status: DC | PRN

## 2023-02-20 MED ORDER — MOMETASONE FURO-FORMOTEROL FUM 200-5 MCG/ACT IN AERO
2.0000 | INHALATION_SPRAY | Freq: Two times a day (BID) | RESPIRATORY_TRACT | Status: DC
  Administered 2023-02-20 – 2023-02-21 (×2): 2 via RESPIRATORY_TRACT
  Filled 2023-02-20: qty 8.8

## 2023-02-20 MED ORDER — ALBUTEROL SULFATE (2.5 MG/3ML) 0.083% IN NEBU: 3.0000 mL | INHALATION_SOLUTION | RESPIRATORY_TRACT | Status: DC | PRN

## 2023-02-20 MED ORDER — METOPROLOL SUCCINATE ER 50 MG PO TB24
50.0000 mg | ORAL_TABLET | Freq: Every day | ORAL | Status: DC
  Administered 2023-02-20 – 2023-02-21 (×2): 50 mg via ORAL
  Filled 2023-02-20 (×2): qty 1

## 2023-02-20 MED ORDER — RIVAROXABAN 15 MG PO TABS
15.0000 mg | ORAL_TABLET | Freq: Every day | ORAL | Status: DC
  Administered 2023-02-20: 15 mg via ORAL
  Filled 2023-02-20 (×2): qty 1

## 2023-02-20 MED ORDER — ALLOPURINOL 100 MG PO TABS
100.0000 mg | ORAL_TABLET | ORAL | Status: DC
  Administered 2023-02-21: 100 mg via ORAL
  Filled 2023-02-20: qty 1

## 2023-02-20 MED ORDER — TRIAMCINOLONE ACETONIDE 0.1 % EX CREA
TOPICAL_CREAM | Freq: Two times a day (BID) | CUTANEOUS | Status: DC
  Filled 2023-02-20: qty 15

## 2023-02-20 MED ORDER — LOSARTAN POTASSIUM 25 MG PO TABS
25.0000 mg | ORAL_TABLET | Freq: Every day | ORAL | Status: DC
  Administered 2023-02-20 – 2023-02-21 (×2): 25 mg via ORAL
  Filled 2023-02-20 (×2): qty 1

## 2023-02-20 MED ORDER — HYDRALAZINE HCL 20 MG/ML IJ SOLN: 5.0000 mg | INTRAMUSCULAR | Status: DC | PRN

## 2023-02-20 MED ORDER — ZINC SULFATE 220 (50 ZN) MG PO CAPS
220.0000 mg | ORAL_CAPSULE | Freq: Every day | ORAL | Status: DC
  Administered 2023-02-20 – 2023-02-21 (×2): 220 mg via ORAL
  Filled 2023-02-20 (×2): qty 1

## 2023-02-20 MED ORDER — SIMVASTATIN 20 MG PO TABS
20.0000 mg | ORAL_TABLET | Freq: Every day | ORAL | Status: DC
  Administered 2023-02-20: 20 mg via ORAL
  Filled 2023-02-20: qty 1

## 2023-02-20 MED ORDER — SODIUM CHLORIDE 0.9 % IV SOLN: INTRAVENOUS | Status: DC

## 2023-02-20 MED ORDER — ACETAMINOPHEN 325 MG PO TABS: 650.0000 mg | ORAL_TABLET | Freq: Four times a day (QID) | ORAL | Status: DC | PRN

## 2023-02-20 NOTE — ED Notes (Signed)
Pt was up for discharge. Pts VS were updated and pt needed to use the bathroom. Pt was assisted to the toilet and then transferred to [facility]. Pt became pale and gray and was slow to respond. Pt was placed back on the monitor. Pts HR was 32 and remained in the 30's for about 3 minutes. Pt remained slow to respond. MD was notified and pt was placed back in bed. Pts VS recovered as well as he color and response.

## 2023-02-20 NOTE — ED Notes (Signed)
Advised nurse that patient has ready bed 

## 2023-02-20 NOTE — ED Triage Notes (Signed)
Pt to ED from home [facility] Syncopal episode this AM in shower, does not remember falling/how fell Wife told EMS there was LOC. This happened about 1 year ago Complains of neck pain  Hx tremors, has some sort of deep brain stimulator Pt alert, oriented. No pain. Skin dry

## 2023-02-20 NOTE — H&P (Signed)
History and Physical    NISHANTH BROTHERSON BTD:[id number] DOB: Dec 10, 1931 DOA: 02/20/2023  Referring MD/NP/PA:   PCP: Dorcas Carrow, DO   Patient coming from:  The patient is coming from home.     Chief Complaint: syncope  HPI: Carlos Richard is a 89 y.o. male with medical history significant of hypertension, hyperlipidemia, COPD, gout, BPH, CKD-3A, essential tremor (s/p of deep brain stimulator placement), atrial fibrillation Xarelto, who presents with syncope.  Per his wife at the bedside, pt was in his overall usual state of health this morning.  He had a bowel movement, then set up to begin a shower. After  taking shower, pt began to stare off, started smacking his lips, then passed out for few seconds. Wife slowly lowered him to the ground, no injury.  Patient does not have unilateral numbness or tinglings in extremities.  No facial droop or slurred speech.  Denies chest pain, cough, shortness breath.  No fever or chills.  No nausea vomiting, diarrhea or abdominal pain.  No symptoms of UTI.  Per ED physician, patient was almost cleared for discharge, but pt had another syncope episode in the ED, which lasted few seconds, no seizure activity noted in ED. He took his Xarelto yesterday.  Data reviewed independently and ED Course: pt was found to have WBC 8.9, GFR> 60, temperature normal, blood pressure 193/99, heart rate 56, RR 27, oxygen saturation 94% on room air.  CT head negative.  CT of C-spine negative for acute injury but showed degenerative disc disease.  Patient is placed on telemetry bed for observation.   EKG: I have personally reviewed.  Sinus rhythm, QTc 452, heart rate 65, LAD, nonspecific T wave change.   Review of Systems:   General: no fevers, chills, no body weight gain, has fatigue HEENT: no blurry vision, hearing changes or sore throat Respiratory: no dyspnea, coughing, wheezing CV: no chest pain, no palpitations GI: no nausea, vomiting, abdominal pain, diarrhea,  constipation GU: no dysuria, burning on urination, increased urinary frequency, hematuria  Ext: no leg edema Neuro: no unilateral weakness, numbness, or tingling, no vision change or hearing loss.  Has syncope Skin: no rash, no skin tear. MSK: No muscle spasm, no deformity, no limitation of range of movement in spin Heme: No easy bruising.  Travel history: No recent long distant travel.   Allergy: No Known Allergies  Past Medical History:  Diagnosis Date   Atrial fibrillation    COPD (chronic obstructive pulmonary disease)    Hypertension    Near syncope    Tremors of nervous system     Past Surgical History:  Procedure Laterality Date   BACK SURGERY     BRAIN SURGERY     stimulator placement for tremors   EYE SURGERY Left 2020   cataract surgery nov    EYE SURGERY Right 2020   cataract surgery dec    Social History:  reports that he quit smoking about 44 years ago. His smoking use included cigarettes. He has never used smokeless tobacco. He reports current alcohol use. He reports that he does not use drugs.  Family History:  Family History  Problem Relation Age of Onset   Tuberculosis Mother    Tremor Paternal Grandmother    Obesity Sister    Prostate cancer Neg Hx    Bladder Cancer Neg Hx    Kidney cancer Neg Hx      Prior to Admission medications   Medication Sig Start Date End Date Taking?  Authorizing Provider  allopurinol (ZYLOPRIM) 100 MG tablet Take 1 tablet (100 mg total) by mouth daily. Monday, Wednesday, Friday 12/14/22   Olevia Perches P, DO  fluticasone-salmeterol (ADVAIR DISKUS) 250-50 MCG/ACT AEPB INHALE 1 PUFF INTO THE LUNGS TWICE A DAY 12/14/22   Johnson, Megan P, DO  furosemide (LASIX) 20 MG tablet TAKE 1 TABLET DAILY. 01/18/23   Laurier Nancy, MD  losartan (COZAAR) 25 MG tablet Take 25 mg by mouth daily. 08/21/22   [provider]  magnesium oxide (MAG-OX) 400 MG tablet Take 400 mg by mouth daily.    [provider]  metoprolol  succinate (TOPROL-XL) 50 MG 24 hr tablet Take 50 mg by mouth daily. 01/11/22   [provider]  Multiple Vitamins-Minerals (ZINC PO) Take 1 tablet by mouth daily.    [provider]  simvastatin (ZOCOR) 20 MG tablet Take 1 tablet (20 mg total) by mouth daily. 12/14/22   Olevia Perches P, DO  triamcinolone cream (KENALOG) 0.1 % SMARTSIG:1 Application Topical 2-3 Times Daily 08/04/22   [provider]  XARELTO 15 MG TABS tablet Take 1 tablet (15 mg total) by mouth daily with supper. 12/14/22   Johnson, Megan P, DO  Zinc 50 MG TABS Take 50 mg by mouth daily.     [provider]    Physical Exam: Vitals:   02/20/23 1315 02/20/23 1330 02/20/23 1345 02/20/23 1400  BP: (!) 160/140 (!) 158/82  (!) 162/80  Pulse: 81 81 84 86  Resp: (!) 28 (!) 33 (!) 28 (!) 25  Temp:      TempSrc:      SpO2: 92% (!) 89% 91% 95%  Weight:      Height:       General: Not in acute distress HEENT:       Eyes: PERRL, EOMI, no scleral icterus.       ENT: No discharge from the ears and nose, no pharynx injection, no tonsillar enlargement.        Neck: No JVD, no bruit, no mass felt. Heme: No neck lymph node enlargement. Cardiac: S1/S2, RRR, No murmurs, No gallops or rubs. Respiratory: No rales, wheezing, rhonchi or rubs. GI: Soft, nondistended, nontender, no rebound pain, no organomegaly, BS present. GU: No hematuria Ext: No pitting leg edema bilaterally. 1+DP/PT pulse bilaterally. Musculoskeletal: No joint deformities, No joint redness or warmth, no limitation of ROM in spin. Skin: No rashes.  Neuro: Alert, oriented X3, cranial nerves II-XII grossly intact, moves all extremities normally. Psych: Patient is not psychotic, no suicidal or hemocidal ideation.  Labs on Admission: I have personally reviewed following labs and imaging studies  CBC: Recent Labs  Lab 02/20/23 0855  WBC 8.9  HGB 14.8  HCT 45.4  MCV 92.7  PLT 211   Basic Metabolic Panel: Recent Labs  Lab  02/20/23 0855  NA 141  K 4.0  CL 106  CO2 27  GLUCOSE 113*  BUN 10  CREATININE 1.09  CALCIUM 8.8*   GFR: Estimated Creatinine Clearance: 47.4 mL/min (by C-G formula based on SCr of 1.09 mg/dL). Liver Function Tests: No results for input(s): "AST", "ALT", "ALKPHOS", "BILITOT", "PROT", "ALBUMIN" in the last 168 hours. No results for input(s): "LIPASE", "AMYLASE" in the last 168 hours. No results for input(s): "AMMONIA" in the last 168 hours. Coagulation Profile: No results for input(s): "INR", "PROTIME" in the last 168 hours. Cardiac Enzymes: No results for input(s): "CKTOTAL", "CKMB", "CKMBINDEX", "TROPONINI" in the last 168 hours. BNP (last 3 results) No  results for input(s): "PROBNP" in the last 8760 hours. HbA1C: No results for input(s): "HGBA1C" in the last 72 hours. CBG: No results for input(s): "GLUCAP" in the last 168 hours. Lipid Profile: No results for input(s): "CHOL", "HDL", "LDLCALC", "TRIG", "CHOLHDL", "LDLDIRECT" in the last 72 hours. Thyroid Function Tests: No results for input(s): "TSH", "T4TOTAL", "FREET4", "T3FREE", "THYROIDAB" in the last 72 hours. Anemia Panel: No results for input(s): "VITAMINB12", "FOLATE", "FERRITIN", "TIBC", "IRON", "RETICCTPCT" in the last 72 hours. Urine analysis:    Component Value Date/Time   COLORURINE YELLOW (A) 12/05/2019 0857   APPEARANCEUR Clear 06/15/2022 0926   LABSPEC 1.010 12/05/2019 0857   LABSPEC 1.027 11/13/2013 1832   PHURINE 7.0 12/05/2019 0857   GLUCOSEU Negative 06/15/2022 0926   GLUCOSEU Negative 11/13/2013 1832   HGBUR NEGATIVE 12/05/2019 0857   BILIRUBINUR Negative 06/15/2022 0926   BILIRUBINUR Negative 11/13/2013 1832   KETONESUR NEGATIVE 12/05/2019 0857   PROTEINUR Negative 06/15/2022 0926   PROTEINUR NEGATIVE 12/05/2019 0857   NITRITE Negative 06/15/2022 0926   NITRITE NEGATIVE 12/05/2019 0857   LEUKOCYTESUR Negative 06/15/2022 0926   LEUKOCYTESUR NEGATIVE 12/05/2019 0857   LEUKOCYTESUR Negative  11/13/2013 1832   Sepsis Labs: @LABRCNTIP (procalcitonin:4,lacticidven:4) )No results found for this or any previous visit (from the past 240 hour(s)).   Radiological Exams on Admission: CT Cervical Spine Wo Contrast  Result Date: 02/20/2023 CLINICAL DATA:  89 year old male with history of syncope. Neck trauma. EXAM: CT CERVICAL SPINE WITHOUT CONTRAST TECHNIQUE: Multidetector CT imaging of the cervical spine was performed without intravenous contrast. Multiplanar CT image reconstructions were also generated. RADIATION DOSE REDUCTION: This exam was performed according to the departmental dose-optimization program which includes automated exposure control, adjustment of the mA and/or kV according to patient size and/or use of iterative reconstruction technique. COMPARISON:  Cervical spine CT scan 04/29/2015. FINDINGS: Alignment: Normal. Skull base and vertebrae: No acute fracture. No primary bone lesion or focal pathologic process. Chronic appearing compression fracture of T3 with 25% loss of anterior vertebral body height. Soft tissues and spinal canal: No prevertebral fluid or swelling. No visible canal hematoma. Disc levels: Mild multilevel degenerative disc disease, most severe at C7-T1. Moderate multilevel facet arthropathy. Upper chest: Lead from deep brain stimulator noted in the soft tissues of the left anterior chest wall and left cervical region. Emphysema. Other: None. IMPRESSION: 1. No evidence of significant acute traumatic injury to the cervical spine. 2. Multilevel degenerative disc disease and cervical spondylosis, as above. Electronically Signed   By: Trudie Reed M.D.   On: 02/20/2023 10:24   CT HEAD WO CONTRAST ( )  Result Date: 02/20/2023 CLINICAL DATA:  Head trauma after syncopal episode this morning in shower. Patient does not remember falling. EXAM: CT HEAD WITHOUT CONTRAST TECHNIQUE: Contiguous axial images were obtained from the base of the skull through the vertex without  intravenous contrast. RADIATION DOSE REDUCTION: This exam was performed according to the departmental dose-optimization program which includes automated exposure control, adjustment of the mA and/or kV according to patient size and/or use of iterative reconstruction technique. COMPARISON:  12/05/2019 FINDINGS: Brain: Stable left frontoparietal deep brain stimulator device with tip over the region of the left thalamus. Ventricles, cisterns and other CSF spaces are otherwise unremarkable. There is moderate chronic ischemic microvascular disease. There is no mass, mass effect, shift of midline structures or acute hemorrhage. No evidence of acute infarction. Vascular: No hyperdense vessel or unexpected calcification. Skull: Normal. Negative for fracture or focal lesion. Sinuses/Orbits: No acute finding. Other: None. IMPRESSION: 1. No  acute findings. 2. Moderate chronic ischemic microvascular disease. 3. Stable left frontoparietal deep brain stimulator device with tip over the region of the left thalamus. Electronically Signed   By: Elberta Fortisaniel  Boyle M.D.   On: 02/20/2023 10:20   DG Chest 2 View  Result Date: 02/20/2023 CLINICAL DATA:  Syncopal episode.  Loss of consciousness with fall. EXAM: CHEST - 2 VIEW COMPARISON:  05/04/2020 FINDINGS: The heart size and mediastinal contours are within normal limits. Changes of COPD are seen. Stable mild parenchymal scarring also noted bilaterally. No evidence of acute infiltrate or edema. No evidence of pleural effusion. Neurostimulator device again seen in the left chest wall, with leads extending cephalad in the left neck. IMPRESSION: COPD. No active cardiopulmonary disease. Electronically Signed   By: Danae OrleansJohn A Stahl M.D.   On: 02/20/2023 09:21      Assessment/Plan Principal Problem:   Syncope Active Problems:   Atrial fibrillation, chronic   Essential tremor   COPD (chronic obstructive pulmonary disease)   HTN (hypertension)   Gout   HLD (hyperlipidemia)   Chronic  kidney disease, stage 3a   Assessment and Plan:  Syncope:  Etiology is not clear. The differential diagnosis is broad, including vasovagal syncope, TIA, arrhythmia, orthostatic status. Pt had another episode of syncope in the ED which was witnessed, no seizure activity noted.  No focal neurodeficit on physical examination.  CT head negative.  Cannot do MRI for brain since patient has deep brain stimulator placement  - Place on tele for obs - Orthostatic vital signs  - hold lasix - 2d echo - Neuro checks  - IVF: NS 75 cc/h - PT/OT eval and treat  Atrial fibrillation, chronic: Heart rate 50-70s. -Xarelto and metoprolol  Essential tremor -S/p of deep brain stimulator  COPD (chronic obstructive pulmonary disease) -As needed bronchodilators  HTN (hypertension) -IV hydralazine as needed -Hold Lasix due to syncope -Continue Cozaar, metoprolol  Gout -Allopurinol  Hyperlipidemia: -Zocor  Chronic kidney disease, stage 3a: Stable.  GFR> 60 -Follow-up with BMP     DVT ppx: on Xarelto  Code Status: Full code  Family Communication:  Yes, patient's wife   at bed side.    Disposition Plan:  Anticipate discharge back to previous environment  Consults called:  none  Admission status and Level of care: Telemetry Medical:  for obs    Dispo: The patient is from: Home              Anticipated d/c is to: Home              Anticipated d/c date is: 1 day              Patient currently is not medically stable to d/c.    Severity of Illness:  The appropriate patient status for this patient is OBSERVATION. Observation status is judged to be reasonable and necessary in order to provide the required intensity of service to ensure the patient's safety. The patient's presenting symptoms, physical exam findings, and initial radiographic and laboratory data in the context of their medical condition is felt to place them at decreased risk for further clinical deterioration. Furthermore,  it is anticipated that the patient will be medically stable for discharge from the hospital within 2 midnights of admission.        Date of Service 02/20/2023    Lorretta HarpXilin Ekam Besson [facility]   If 7PM-7AM, please contact night-coverage [email or web address] 02/20/2023, 5:16 PM

## 2023-02-20 NOTE — Discharge Instructions (Signed)
Do not take your Lasix tomorrow.  Try to drink enough water to keep your urine relatively clear.  This is a good marker of dehydration.  I would recommend discussing your symptoms with your primary care doctor in the next week to see if any medications need to be changed.  Try to sit down when you are showering.

## 2023-02-20 NOTE — ED Notes (Signed)
Orthostatics  Laying BP 158/88  HR 76 Sitting BP 105/60  HR 78 Standing BP 78/41  HR 81 MD notifed

## 2023-02-20 NOTE — ED Provider Notes (Addendum)
[facility] Provider Note    Event Date/Time   First MD Initiated Contact with Patient 02/20/23 [phone]     (approximate)   History   Loss of Consciousness   HPI  Carlos Richard is a 89 y.o. male here with loss of consciousness.  The patient states he was in his overall usual state of health this morning.  He had a bowel movement, then set up to begin a shower.  Several minutes later, patient reportedly began to stare off.  He started smacking his lips.  He then syncopized.  Wife was helping him wash, and slowly lowered him to the ground.  She does not believe it is head very hard.  Denies any pain currently.  He has had similar symptoms in the past, particularly when standing up.  Denies any recent medication changes.  No chest pain.  No current focal numbness or weakness.     Physical Exam   Triage Vital Signs: ED Triage Vitals  Enc Vitals Group     BP 02/20/23 0855 (!) 194/82     Pulse Rate 02/20/23 0855 61     Resp 02/20/23 0855 16     Temp 02/20/23 0855 98 F (36.7 C)     Temp Source 02/20/23 0855 Oral     SpO2 02/20/23 0855 95 %     Weight 02/20/23 0850 166 lb (75.3 kg)     Height 02/20/23 0850 5\' 10"  (1.778 m)     Head Circumference --      Peak Flow --      Pain Score 02/20/23 0850 0     Pain Loc --      Pain Edu? --      Excl. in GC? --     Most recent vital signs: Vitals:   02/20/23 1345 02/20/23 1400  BP:  (!) 162/80  Pulse: 84 86  Resp: (!) 28 (!) 25  Temp:    SpO2: 91% 95%     General: Awake, no distress.  CV:  Good peripheral perfusion.  Regular rate and rhythm.  No murmurs. Resp:  Normal work of breathing.  Lungs clear to auscultation bilaterally. Abd:  No distention.  No significant tenderness. Other:  Mildly dry mucous membranes.  Cranial nerves II through XII intact.  Strength out of 5 bilateral upper and lower extremities were normal sensation to light touch.   ED Results / Procedures / Treatments   Labs (all  labs ordered are listed, but only abnormal results are displayed) Labs Reviewed  BASIC METABOLIC PANEL - Abnormal; Notable for the following components:      Result Value   Glucose, Bld 113 (*)    Calcium 8.8 (*)    All other components within normal limits  CBC  TROPONIN I (HIGH SENSITIVITY)  TROPONIN I (HIGH SENSITIVITY)     EKG Normal sinus rhythm, ventricular rate 65.  PR 173, QRS 87, QTc 452.  No acute ST elevations or depressions.  No acute evidence of acute ischemia or infarct.   RADIOLOGY Chest x-ray: COPD, no acute disease CT Head/C Spine: No acute findings multilevel degenerative disease  I also independently reviewed and agree with radiologist interpretations.   PROCEDURES:  Critical Care performed: No  .1-3 Lead EKG Interpretation  Performed by: Shaune Pollack, MD Authorized by: Shaune Pollack, MD     Interpretation: normal     ECG rate:  60-70   ECG rate assessment: normal     Rhythm: sinus rhythm  Ectopy: none     Conduction: normal   Comments:     Indication: Syncope     MEDICATIONS ORDERED IN ED: Medications  albuterol (PROVENTIL) (2.5 MG/3ML) 0.083% nebulizer solution 3 mL (has no administration in time range)  dextromethorphan-guaiFENesin (MUCINEX DM) 30-600 MG per 12 hr tablet 1 tablet (has no administration in time range)  ondansetron (ZOFRAN) injection 4 mg (4 mg Intravenous Given 02/20/23 1813)  acetaminophen (TYLENOL) tablet 650 mg (has no administration in time range)  hydrALAZINE (APRESOLINE) injection 5 mg (has no administration in time range)  0.9 %  sodium chloride infusion ( Intravenous New Bag/Given 02/20/23 1308)  allopurinol (ZYLOPRIM) tablet 100 mg (has no administration in time range)  losartan (COZAAR) tablet 25 mg (25 mg Oral Given 02/20/23 1715)  metoprolol succinate (TOPROL-XL) 24 hr tablet 50 mg (50 mg Oral Given 02/20/23 1715)  simvastatin (ZOCOR) tablet 20 mg (20 mg Oral Given 02/20/23 1715)  magnesium oxide (MAG-OX) tablet  400 mg (400 mg Oral Given 02/20/23 1715)  Rivaroxaban (XARELTO) tablet 15 mg (15 mg Oral Given 02/20/23 1715)  zinc sulfate capsule 220 mg (220 mg Oral Given 02/20/23 1715)  mometasone-formoterol (DULERA) 200-5 MCG/ACT inhaler 2 puff (has no administration in time range)  triamcinolone cream (KENALOG) 0.1 % cream (has no administration in time range)     IMPRESSION / MDM / ASSESSMENT AND PLAN / ED COURSE  I reviewed the triage vital signs and the nursing notes.                              Differential diagnosis includes, but is not limited to, orthostasis, vasovagal syncope, arrhythmia, valvular disease, anemia, less likely ICH/CVA  Patient's presentation is most consistent with acute presentation with potential threat to life or bodily function.  The patient is on the cardiac monitor to evaluate for evidence of arrhythmia and/or significant heart rate changes  89 year old male here with syncopal episode in the shower.  The patient has a long, well-documented history of similar episodes and I suspect it was due to a combination of orthostasis and relative dehydration.  Patient is hypertensive here because he did not take his medications but remains fairly significantly orthostatic.  This occurred after a bowel movement, he stood up, then got into a warm shower which I suspect caused some vasodilation and drop in blood pressure.  He now is completely back to his baseline.  No focal neurological deficits.  CT head and neck are negative.  Chest x-ray clear.  No leukocytosis or anemia.  BMP unremarkable with normal electrolytes.  Troponin negative x 2 and EKG is nonischemic with no arrhythmia or signs of ischemia.  Will encourage hydration, have him hold his Lasix tomorrow, and have him call to discuss his medications with his PCP as he may need adjustment of his blood pressure medicines to prevent similar orthostatic episodes in the future.  Otherwise, he is back to his baseline and feels comfortable with  plan to discharge home.   While ambulating for d/c, pt became bradycardic and had syncopal episode. This was after having a BM. EKG shows no ischemia and he is back to baseline. No seizure like activity. Given that this is second episode in <24 hr, will admit for obs/tele/further evaluation.   FINAL CLINICAL IMPRESSION(S) / ED DIAGNOSES   Final diagnoses:  Syncope, unspecified syncope type     Rx / DC Orders   ED Discharge Orders  None        Note:  This document was prepared using Dragon voice recognition software and may include unintentional dictation errors.   Shaune Pollack, MD 02/20/23 8101    Shaune Pollack, MD 02/20/23 [phone]

## 2023-02-21 ENCOUNTER — Observation Stay (HOSPITAL_BASED_OUTPATIENT_CLINIC_OR_DEPARTMENT_OTHER)
Admit: 2023-02-21 | Discharge: 2023-02-21 | Disposition: A | Discharge status: Home / Self Care | Payer: Medicare Other | Attending: Internal Medicine | Admitting: Internal Medicine

## 2023-02-21 DIAGNOSIS — R55 Syncope and collapse: Secondary | ICD-10-CM | POA: Diagnosis not present

## 2023-02-21 DIAGNOSIS — I482 Chronic atrial fibrillation, unspecified: Secondary | ICD-10-CM | POA: Diagnosis not present

## 2023-02-21 DIAGNOSIS — I10 Essential (primary) hypertension: Secondary | ICD-10-CM | POA: Diagnosis not present

## 2023-02-21 DIAGNOSIS — J449 Chronic obstructive pulmonary disease, unspecified: Secondary | ICD-10-CM | POA: Diagnosis not present

## 2023-02-21 LAB — ECHOCARDIOGRAM COMPLETE
AR max vel: 2.37 cm2
AV Area VTI: 2.56 cm2
AV Area mean vel: 2.46 cm2
AV Mean grad: 5.5 mmHg
AV Peak grad: 9.4 mmHg
Ao pk vel: 1.54 m/s
Area-P 1/2: 3.5 cm2
Height: 70 in
MV VTI: 2.81 cm2
P 1/2 time: 543 msec
S' Lateral: 2.6 cm
Weight: 2656 oz

## 2023-02-21 LAB — BASIC METABOLIC PANEL
Anion gap: 5 (ref 5–15)
BUN: 15 mg/dL (ref 8–23)
CO2: 26 mmol/L (ref 22–32)
Calcium: 8.2 mg/dL — ABNORMAL LOW (ref 8.9–10.3)
Chloride: 108 mmol/L (ref 98–111)
Creatinine, Ser: 1.21 mg/dL (ref 0.61–1.24)
GFR, Estimated: 57 mL/min — ABNORMAL LOW (ref >60)
Glucose, Bld: 105 mg/dL — ABNORMAL HIGH (ref 70–99)
Potassium: 3.6 mmol/L (ref 3.5–5.1)
Sodium: 139 mmol/L (ref 135–145)

## 2023-02-21 NOTE — Evaluation (Signed)
Occupational Therapy Evaluation Patient Details Name: Carlos Richard MRN: [id number] DOB: 04/20/1932 Today's Date: 02/21/2023   History of Present Illness Carlos Richard is a 89 y.o. male with medical history significant of hypertension, hyperlipidemia, COPD, gout, BPH, CKD-3A, essential tremor (s/p of deep brain stimulator placement), atrial fibrillation Xarelto, who presents with syncope.   Clinical Impression   Patient agreeable to OT evaluation. Spouse present. Pt presenting with decreased independence in self care, balance, functional mobility/transfers, endurance, and safety awareness. PTA pt received assistance with ADLs PRN (bathing, LB dressing), all IADLs (household chores, med mgmt, driving), and was Mod I for functional mobility using a SPC. Pt currently functioning at supervision for bed mobility, CGA-Min A for simulated toilet transfer, and Min guard for functional mobility at room level using a RW. Pt denied SOB with activity, however, SpO2 reading 80% on RA after activity. Pt deferred placing O2 back on for recovery. RN aware. Pt will benefit from skilled acute OT services to address deficits noted below. OT recommends ongoing therapy upon discharge to maximize safety and independence with ADLs, decrease fall risk, decrease caregiver burden, and promote return to PLOF.     Recommendations for follow up therapy are one component of a multi-disciplinary discharge planning process, led by the attending physician.  Recommendations may be updated based on patient status, additional functional criteria and insurance authorization.   Assistance Recommended at Discharge Frequent or constant Supervision/Assistance  Patient can return home with the following A little help with walking and/or transfers;Assistance with cooking/housework;Assist for transportation;Help with stairs or ramp for entrance;A lot of help with bathing/dressing/bathroom;Direct supervision/assist for medications  management    Functional Status Assessment  Patient has had a recent decline in their functional status and demonstrates the ability to make significant improvements in function in a reasonable and predictable amount of time.  Equipment Recommendations  None recommended by OT    Recommendations for Other Services       Precautions / Restrictions Precautions Precautions: Fall Restrictions Weight Bearing Restrictions: No      Mobility Bed Mobility Overal bed mobility: Needs Assistance Bed Mobility: Supine to Sit, Sit to Supine     Supine to sit: Supervision, HOB elevated Sit to supine: Supervision        Transfers Overall transfer level: Needs assistance Equipment used: Rolling walker (2 wheels) Transfers: Sit to/from Stand Sit to Stand: Min guard, Min assist                  Balance Overall balance assessment: Needs assistance Sitting-balance support: Feet supported Sitting balance-Leahy Scale: Fair Sitting balance - Comments: pt with posterior lean during dynamic ADL task, able to self correct with VC   Standing balance support: Bilateral upper extremity supported, During functional activity Standing balance-Leahy Scale: Fair           ADL either performed or assessed with clinical judgement   ADL Overall ADL's : Needs assistance/impaired       Lower Body Dressing: Moderate assistance;Sitting/lateral leans Lower Body Dressing Details (indicate cue type and reason): able to doff B socks but required assistance to don in sitting (pt/wife reports this is baseline)  Toilet Transfer: Rolling walker (2 wheels);Min guard;Minimal assistance Toilet Transfer Details (indicate cue type and reason): simulated         Functional mobility during ADLs: Min guard;Rolling walker (2 wheels)       Vision Baseline Vision/History: 1 Wears glasses (readers) Patient Visual Report: No change from baseline  Perception     Praxis      Pertinent  Vitals/Pain Pain Assessment Pain Assessment: No/denies pain     Hand Dominance Right   Extremity/Trunk Assessment Upper Extremity Assessment Upper Extremity Assessment: Generalized weakness (pt/wife reported previous shoulder fx from 2020, h/o essential tremor with deep brain stimulator placement)   Lower Extremity Assessment Lower Extremity Assessment: Generalized weakness       Communication Communication Communication: No difficulties   Cognition Arousal/Alertness: Awake/alert Behavior During Therapy: WFL for tasks assessed/performed Overall Cognitive Status: Within Functional Limits for tasks assessed           General Comments: Relied on wife for some details, but quickly corrected himself. A&Ox4 although did not know specific date.     General Comments  Pt received on RA, SpO2 90% at rest and 80% after activity. Improved with seated rest break and PLB. Pt deferred placing O2 back on for recovery. RN notified. No c/o dizziness.    Exercises Other Exercises Other Exercises: OT provided education re: role of OT, OT POC, post acute recs, sitting up for all meals, EOB/OOB mobility with assistance, home/fall safety.     Shoulder Instructions      Home Living Family/patient expects to be discharged to:: Private residence Living Arrangements: Spouse/significant other Available Help at Discharge: Family;Available 24 hours/day Type of Home: House Home Access: Stairs to enter Entergy CorporationEntrance Stairs-Number of Steps: 5 Entrance Stairs-Rails: Right;Left;Can reach both Home Layout: One level     Bathroom Shower/Tub: IT trainerTub/shower unit;Curtain   Bathroom Toilet: Standard     Home Equipment: Merchant navy officerAdaptive equipment;Shower seat;Cane - single point;Rollator (4 wheels) Adaptive Equipment: Reacher;Long-handled shoe horn        Prior Functioning/Environment Prior Level of Function : Needs assist             Mobility Comments: Mod I with SPC, wife reports pt goes outside to pick up  pine cones in the yard using his reacher ADLs Comments: Wife assists with ADLs PRN (bathing, LB dressing) and all IADLs (household chores, med mgmt, driving).        OT Problem List: Cardiopulmonary status limiting activity;Decreased strength;Decreased range of motion;Decreased activity tolerance;Impaired balance (sitting and/or standing);Decreased knowledge of use of DME or AE;Decreased knowledge of precautions;Decreased coordination;Decreased safety awareness      OT Treatment/Interventions: Self-care/ADL training;Therapeutic exercise;Neuromuscular education;Energy conservation;DME and/or AE instruction;Manual therapy;Modalities;Balance training;Patient/family education;Visual/perceptual remediation/compensation;Cognitive remediation/compensation;Therapeutic activities;Splinting    OT Goals(Current goals can be found in the care plan section) Acute Rehab OT Goals Patient Stated Goal: return home OT Goal Formulation: With patient/family Time For Goal Achievement: 03/07/23 Potential to Achieve Goals: Good   OT Frequency: Min 2X/week    Co-evaluation              AM-PAC OT "6 Clicks" Daily Activity     Outcome Measure Help from another person eating meals?: None Help from another person taking care of personal grooming?: A Little Help from another person toileting, which includes using toliet, bedpan, or urinal?: A Little Help from another person bathing (including washing, rinsing, drying)?: A Lot Help from another person to put on and taking off regular upper body clothing?: A Little Help from another person to put on and taking off regular lower body clothing?: A Lot 6 Click Score: 17   End of Session Equipment Utilized During Treatment: Gait belt;Rolling walker (2 wheels) Nurse Communication: Mobility status;Other (comment) (SpO2)  Activity Tolerance: Patient tolerated treatment well;Patient limited by fatigue Patient left: in bed;with call bell/phone within reach;with bed  alarm set;with family/visitor present  OT Visit Diagnosis: Unsteadiness on feet (R26.81);Muscle weakness (generalized) (M62.81)                Time: 0093-8182 OT Time Calculation (min): 30 min Charges:  OT General Charges $OT Visit: 1 Visit OT Evaluation $OT Eval Moderate Complexity: 1 Mod  [facility] MS, OTR/L ascom [phone]  02/21/23, 1:47 PM

## 2023-02-21 NOTE — Progress Notes (Signed)
SATURATION QUALIFICATIONS: (This note is used to comply with regulatory documentation for home oxygen)  Patient Saturations on Room Air at Rest = 94%  Patient Saturations on Room Air while Ambulating = 87%  Patient Saturations on 2 Liters of oxygen while Ambulating = 99%  Please briefly explain why patient needs home oxygen: to maintain oxygen saturations within normal limits while ambulating

## 2023-02-21 NOTE — Progress Notes (Signed)
*  PRELIMINARY RESULTS* Echocardiogram 2D Echocardiogram has been performed.  Cristela Blue 02/21/2023, 7:50 AM

## 2023-02-21 NOTE — TOC Progression Note (Addendum)
Transition of Care [facility]) - Progression Note    Patient Details  Name: Carlos Richard MRN: [id number] Date of Birth: 15-Jul-1932  [facility] CM/SW Contact  Marlowe Sax, RN Phone Number: 02/21/2023, 12:27 PM  Clinical Narrative:    Met with the patient in the room, he stated that he has a RW and a cane at home, The patient has used [facility] for [facility] in the past and is agreeable to use them again, Amy with [facility] accepted the patient for [facility] services His wife will provide transportation [employer] to deliver Oxygen to the patient prior to DC         Expected Discharge Plan and Services                                               Social Determinants of Health (SDOH) Interventions SDOH Screenings   Food Insecurity: No Food Insecurity (02/20/2023)  Housing: Low Risk  (02/20/2023)  Transportation Needs: No Transportation Needs (02/20/2023)  Utilities: Not At Risk (02/20/2023)  Alcohol Screen: Low Risk  (02/17/2022)  Depression (PHQ2-9): Low Risk  (02/14/2023)  Financial Resource Strain: Low Risk  (02/17/2022)  Physical Activity: Inactive (02/17/2022)  Social Connections: Moderately Integrated (02/17/2022)  Stress: No Stress Concern Present (02/17/2022)  Tobacco Use: Medium Risk (02/20/2023)    Readmission Risk Interventions     No data to display

## 2023-02-21 NOTE — Discharge Summary (Addendum)
Physician Discharge Summary  DEMITRE BORTS NAT:[id number] DOB: [birth year] DOA: 02/20/2023  PCP: Dorcas Carrow, DO  Admit date: 02/20/2023 Discharge date: 02/21/2023  Admitted From: home  Disposition:  home w/ home health   Recommendations for Outpatient Follow-up:  Follow up with PCP w/in 1 week  Home Health: yes Equipment/Devices: 2L [place]   Discharge Condition: stable  CODE STATUS: full  Diet recommendation: Heart Healthy   Brief/Interim Summary: HPI was taken from Dr. Clyde Lundborg: Carlos Richard is a 89 y.o. male with medical history significant of hypertension, hyperlipidemia, COPD, gout, BPH, CKD-3A, essential tremor (s/p of deep brain stimulator placement), atrial fibrillation Xarelto, who presents with syncope.   Per his wife at the bedside, pt was in his overall usual state of health this morning.  He had a bowel movement, then set up to begin a shower. After  taking shower, pt began to stare off, started smacking his lips, then passed out for few seconds. Wife slowly lowered him to the ground, no injury.  Patient does not have unilateral numbness or tinglings in extremities.  No facial droop or slurred speech.  Denies chest pain, cough, shortness breath.  No fever or chills.  No nausea vomiting, diarrhea or abdominal pain.  No symptoms of UTI.  Per ED physician, patient was almost cleared for discharge, but pt had another syncope episode in the ED, which lasted few seconds, no seizure activity noted in ED. He took his Xarelto yesterday.   Data reviewed independently and ED Course: pt was found to have WBC 8.9, GFR> 60, temperature normal, blood pressure 193/99, heart rate 56, RR 27, oxygen saturation 94% on room air.  CT head negative.  CT of C-spine negative for acute injury but showed degenerative disc disease.  Patient is placed on telemetry bed for observation.  Discharge Diagnoses:  Principal Problem:   Syncope Active Problems:   Atrial fibrillation, chronic   Essential tremor    COPD (chronic obstructive pulmonary disease)   HTN (hypertension)   Gout   HLD (hyperlipidemia)   Chronic kidney disease, stage 3a Syncope:  etiology unclear, likely secondary orthostatic hypotension. Ddx: vasovagal syncope, TIA, arrhythmia, orthostatic hypotension. Pt had another episode of syncope in the ED which was witnessed, no seizure activity noted. CT head negative. Cannot do MRI for brain since patient has deep brain stimulator placement. Orthostatic vitals were positive on day of admission & repeat orthostatic vitals are neg today 02/21/23. Echo shows EF 60-65%, normal diastolic function, no regional wall motion abnormalities. Therapy recs home health. Continue on IVFs.    Chronic a. fib: continue on metoprolol, xarelto    Essential tremor: s/p placement of deep brain stimulator    COPD: w/o exacerbation. Continue on bronchodilators   HTN: continue on home dose of metoprolol, losartan. Holding home dose of lasix secondary to syncope. IV hydralazine prn    Gout: continue on home dose of allopurinol    HLD: continue on statin    CKDIIIa: Cr is stable.    Discharge Instructions  Discharge Instructions     Diet - low sodium heart healthy   Complete by: As directed    Discharge instructions   Complete by: As directed    F/u w/ PCP w/in 1 week. Take blood pressure daily, write down what your blood pressure is and take the list of blood pressures to your PCP at your next visit.   Increase activity slowly   Complete by: As directed       Allergies  as of 02/21/2023   No Known Allergies      Medication List     TAKE these medications    allopurinol 100 MG tablet Commonly known as: ZYLOPRIM Take 1 tablet (100 mg total) by mouth daily. Monday, Wednesday, Friday   fluticasone-salmeterol 250-50 MCG/ACT Aepb Commonly known as: Advair Diskus INHALE 1 PUFF INTO THE LUNGS TWICE A DAY   furosemide 20 MG tablet Commonly known as: LASIX TAKE 1 TABLET DAILY.   losartan 25 MG  tablet Commonly known as: COZAAR Take 25 mg by mouth daily.   magnesium oxide 400 MG tablet Commonly known as: MAG-OX Take 400 mg by mouth daily.   metoprolol succinate 50 MG 24 hr tablet Commonly known as: TOPROL-XL Take 50 mg by mouth daily.   simvastatin 20 MG tablet Commonly known as: ZOCOR Take 1 tablet (20 mg total) by mouth daily.   triamcinolone cream 0.1 % Commonly known as: KENALOG SMARTSIG:1 Application Topical 2-3 Times Daily   Xarelto 15 MG Tabs tablet Generic drug: Rivaroxaban Take 1 tablet (15 mg total) by mouth daily with supper.   Zinc 50 MG Tabs Take 50 mg by mouth daily.   ZINC PO Take 1 tablet by mouth daily.               Durable Medical Equipment  (From admission, onward)           Start     Ordered   02/21/23 1300  For home use only DME oxygen  Once       Question Answer Comment  Length of Need Lifetime   Mode or (Route) Nasal cannula   Liters per Minute 2   Frequency Continuous (stationary and portable oxygen unit needed)   Oxygen conserving device Yes   Oxygen delivery system Gas      02/21/23 1300            Follow-up Information     [facility].   Specialty: Emergency Medicine Why: As needed Contact information: [address] [id number] [address] [place] [address] [phone]        Olevia Perches P, DO In 1 week.   Specialty: Family Medicine Contact information: [address] [place] [address] [phone]                No Known Allergies  Consultations:   Procedures/Studies: ECHOCARDIOGRAM COMPLETE  Result Date: 02/21/2023    ECHOCARDIOGRAM REPORT   Patient Name:   Carlos Richard Date of Exam: 02/21/2023 Medical Rec #:  [id number]        Height:       70.0 in Accession #:    [id number]       Weight:       166.0 lb Date of Birth:  10/09/1932        BSA:          1.928 m Patient Age:    89 years         BP:           126/79 mmHg Patient  Gender: M                HR:           80 bpm. Exam Location:  [facility] Procedure: 2D Echo, Cardiac Doppler and Color Doppler Indications:     Syncope R55  History:         Patient has prior history of Echocardiogram examinations, most  recent 07/01/2020. COPD, Arrythmias:Atrial Fibrillation; Risk                  Factors:Hypertension.  Sonographer:     Cristela Blue Referring Phys:  [id number] Brien Few NIU Diagnosing Phys: Lorine Bears MD  Sonographer Comments: No parasternal window. IMPRESSIONS  1. Left ventricular ejection fraction, by estimation, is 60 to 65%. The left ventricle has normal function. The left ventricle has no regional wall motion abnormalities. There is mild left ventricular hypertrophy. Left ventricular diastolic parameters were normal.  2. Right ventricular systolic function is normal. The right ventricular size is normal. There is moderately elevated pulmonary artery systolic pressure.  3. The mitral valve is normal in structure. Trivial mitral valve regurgitation. No evidence of mitral stenosis. Moderate mitral annular calcification.  4. The aortic valve is normal in structure. Aortic valve regurgitation is mild. Aortic valve sclerosis/calcification is present, without any evidence of aortic stenosis.  5. The inferior vena cava is normal in size with greater than 50% respiratory variability, suggesting right atrial pressure of 3 mmHg. FINDINGS  Left Ventricle: Left ventricular ejection fraction, by estimation, is 60 to 65%. The left ventricle has normal function. The left ventricle has no regional wall motion abnormalities. The left ventricular internal cavity size was normal in size. There is  mild left ventricular hypertrophy. Left ventricular diastolic parameters were normal. Right Ventricle: The right ventricular size is normal. No increase in right ventricular wall thickness. Right ventricular systolic function is normal. There is moderately elevated pulmonary artery systolic pressure.  The tricuspid regurgitant velocity is 3.27 m/s, and with an assumed right atrial pressure of 3 mmHg, the estimated right ventricular systolic pressure is 45.8 mmHg. Left Atrium: Left atrial size was normal in size. Right Atrium: Right atrial size was normal in size. Pericardium: There is no evidence of pericardial effusion. Mitral Valve: The mitral valve is normal in structure. Moderate mitral annular calcification. Trivial mitral valve regurgitation. No evidence of mitral valve stenosis. MV peak gradient, 4.6 mmHg. The mean mitral valve gradient is 2.0 mmHg. Tricuspid Valve: The tricuspid valve is normal in structure. Tricuspid valve regurgitation is mild . No evidence of tricuspid stenosis. Aortic Valve: The aortic valve is normal in structure. Aortic valve regurgitation is mild. Aortic regurgitation PHT measures 543 msec. Aortic valve sclerosis/calcification is present, without any evidence of aortic stenosis. Aortic valve mean gradient measures 5.5 mmHg. Aortic valve peak gradient measures 9.4 mmHg. Aortic valve area, by VTI measures 2.56 cm. Pulmonic Valve: The pulmonic valve was normal in structure. Pulmonic valve regurgitation is not visualized. No evidence of pulmonic stenosis. Aorta: The aortic root is normal in size and structure. Venous: The inferior vena cava is normal in size with greater than 50% respiratory variability, suggesting right atrial pressure of 3 mmHg. IAS/Shunts: No atrial level shunt detected by color flow Doppler.  LEFT VENTRICLE PLAX 2D LVIDd:         3.90 cm   Diastology LVIDs:         2.60 cm   LV e' medial:    7.72 cm/s LV PW:         1.10 cm   LV E/e' medial:  13.7 LV IVS:        1.50 cm   LV e' lateral:   8.59 cm/s LVOT diam:     2.00 cm   LV E/e' lateral: 12.3 LV SV:         86 LV SV Index:   45 LVOT Area:  3.14 cm  RIGHT VENTRICLE RV Basal diam:  4.10 cm RV Mid diam:    3.00 cm RV S prime:     12.60 cm/s TAPSE (M-mode): 2.9 cm LEFT ATRIUM             Index        RIGHT  ATRIUM           Index LA diam:        2.30 cm 1.19 cm/m   RA Area:     15.80 cm LA Vol (A2C):   42.3 ml 21.94 ml/m  RA Volume:   41.10 ml  21.31 ml/m LA Vol (A4C):   17.8 ml 9.23 ml/m LA Biplane Vol: 28.1 ml 14.57 ml/m  AORTIC VALVE AV Area (Vmax):    2.37 cm AV Area (Vmean):   2.46 cm AV Area (VTI):     2.56 cm AV Vmax:           153.50 cm/s AV Vmean:          106.000 cm/s AV VTI:            0.338 m AV Peak Grad:      9.4 mmHg AV Mean Grad:      5.5 mmHg LVOT Vmax:         116.00 cm/s LVOT Vmean:        83.100 cm/s LVOT VTI:          0.275 m LVOT/AV VTI ratio: 0.81 AI PHT:            543 msec  AORTA Ao Root diam: 3.20 cm MITRAL VALVE                TRICUSPID VALVE MV Area (PHT): 3.50 cm     TR Peak grad:   42.8 mmHg MV Area VTI:   2.81 cm     TR Vmax:        327.00 cm/s MV Peak grad:  4.6 mmHg MV Mean grad:  2.0 mmHg     SHUNTS MV Vmax:       1.07 m/s     Systemic VTI:  0.28 m MV Vmean:      57.7 cm/s    Systemic Diam: 2.00 cm MV Decel Time: 217 msec MV E velocity: 106.00 cm/s MV A velocity: 95.40 cm/s MV E/A ratio:  1.11 Lorine BearsMuhammad Arida MD Electronically signed by Lorine BearsMuhammad Arida MD Signature Date/Time: 02/21/2023/8:20:22 AM    Final    CT Cervical Spine Wo Contrast  Result Date: 02/20/2023 CLINICAL DATA:  [age] year old male with history of syncope. Neck trauma. EXAM: CT CERVICAL SPINE WITHOUT CONTRAST TECHNIQUE: Multidetector CT imaging of the cervical spine was performed without intravenous contrast. Multiplanar CT image reconstructions were also generated. RADIATION DOSE REDUCTION: This exam was performed according to the departmental dose-optimization program which includes automated exposure control, adjustment of the mA and/or kV according to patient size and/or use of iterative reconstruction technique. COMPARISON:  Cervical spine CT scan 04/29/2015. FINDINGS: Alignment: Normal. Skull base and vertebrae: No acute fracture. No primary bone lesion or focal pathologic process. Chronic appearing  compression fracture of T3 with 25% loss of anterior vertebral body height. Soft tissues and spinal canal: No prevertebral fluid or swelling. No visible canal hematoma. Disc levels: Mild multilevel degenerative disc disease, most severe at C7-T1. Moderate multilevel facet arthropathy. Upper chest: Lead from deep brain stimulator noted in the soft tissues of the left anterior chest wall and left cervical region. Emphysema. Other: None.  IMPRESSION: 1. No evidence of significant acute traumatic injury to the cervical spine. 2. Multilevel degenerative disc disease and cervical spondylosis, as above. Electronically Signed   By: Trudie Reed M.D.   On: 02/20/2023 10:24   CT HEAD WO CONTRAST ( )  Result Date: 02/20/2023 CLINICAL DATA:  Head trauma after syncopal episode this morning in shower. Patient does not remember falling. EXAM: CT HEAD WITHOUT CONTRAST TECHNIQUE: Contiguous axial images were obtained from the base of the skull through the vertex without intravenous contrast. RADIATION DOSE REDUCTION: This exam was performed according to the departmental dose-optimization program which includes automated exposure control, adjustment of the mA and/or kV according to patient size and/or use of iterative reconstruction technique. COMPARISON:  12/05/2019 FINDINGS: Brain: Stable left frontoparietal deep brain stimulator device with tip over the region of the left thalamus. Ventricles, cisterns and other CSF spaces are otherwise unremarkable. There is moderate chronic ischemic microvascular disease. There is no mass, mass effect, shift of midline structures or acute hemorrhage. No evidence of acute infarction. Vascular: No hyperdense vessel or unexpected calcification. Skull: Normal. Negative for fracture or focal lesion. Sinuses/Orbits: No acute finding. Other: None. IMPRESSION: 1. No acute findings. 2. Moderate chronic ischemic microvascular disease. 3. Stable left frontoparietal deep brain stimulator device with  tip over the region of the left thalamus. Electronically Signed   By: Elberta Fortis M.D.   On: 02/20/2023 10:20   DG Chest 2 View  Result Date: 02/20/2023 CLINICAL DATA:  Syncopal episode.  Loss of consciousness with fall. EXAM: CHEST - 2 VIEW COMPARISON:  05/04/2020 FINDINGS: The heart size and mediastinal contours are within normal limits. Changes of COPD are seen. Stable mild parenchymal scarring also noted bilaterally. No evidence of acute infiltrate or edema. No evidence of pleural effusion. Neurostimulator device again seen in the left chest wall, with leads extending cephalad in the left neck. IMPRESSION: COPD. No active cardiopulmonary disease. Electronically Signed   By: Danae Orleans M.D.   On: 02/20/2023 09:21   (Echo, Carotid, EGD, Colonoscopy, ERCP)    Subjective: Pt denies any complaints    Discharge Exam: Vitals:   02/20/23 2336 02/21/23 0829  BP: 126/79 (!) 142/70  Pulse: 80 61  Resp: 20 20  Temp: 98.5 F (36.9 C) 98 F (36.7 C)  SpO2: 91% 95%   Vitals:   02/20/23 1345 02/20/23 1400 02/20/23 2336 02/21/23 0829  BP:  (!) 162/80 126/79 (!) 142/70  Pulse: 84 86 80 61  Resp: (!) 28 (!) 25 20 20   Temp:   98.5 F (36.9 C) 98 F (36.7 C)  TempSrc:      SpO2: 91% 95% 91% 95%  Weight:      Height:        General: Pt is alert, awake, not in acute distress Cardiovascular: S1/S2 +, no rubs, no gallops Respiratory: CTA bilaterally, no wheezing, no rhonchi Abdominal: Soft, NT, ND, bowel sounds + Extremities: no edema, no cyanosis    The results of significant diagnostics from this hospitalization (including imaging, microbiology, ancillary and laboratory) are listed below for reference.     Microbiology: No results found for this or any previous visit (from the past 240 hour(s)).   Labs: BNP (last 3 results) No results for input(s): "BNP" in the last 8760 hours. Basic Metabolic Panel: Recent Labs  Lab 02/20/23 0855 02/21/23 0846  NA 141 139  K 4.0 3.6  CL  106 108  CO2 27 26  GLUCOSE 113* 105*  BUN 10 15  CREATININE 1.09 1.21  CALCIUM 8.8* 8.2*   Liver Function Tests: No results for input(s): "AST", "ALT", "ALKPHOS", "BILITOT", "PROT", "ALBUMIN" in the last 168 hours. No results for input(s): "LIPASE", "AMYLASE" in the last 168 hours. No results for input(s): "AMMONIA" in the last 168 hours. CBC: Recent Labs  Lab 02/20/23 0855  WBC 8.9  HGB 14.8  HCT 45.4  MCV 92.7  PLT 211   Cardiac Enzymes: No results for input(s): "CKTOTAL", "CKMB", "CKMBINDEX", "TROPONINI" in the last 168 hours. BNP: Invalid input(s): "POCBNP" CBG: No results for input(s): "GLUCAP" in the last 168 hours. D-Dimer No results for input(s): "DDIMER" in the last 72 hours. Hgb A1c No results for input(s): "HGBA1C" in the last 72 hours. Lipid Profile No results for input(s): "CHOL", "HDL", "LDLCALC", "TRIG", "CHOLHDL", "LDLDIRECT" in the last 72 hours. Thyroid function studies No results for input(s): "TSH", "T4TOTAL", "T3FREE", "THYROIDAB" in the last 72 hours.  Invalid input(s): "FREET3" Anemia work up No results for input(s): "VITAMINB12", "FOLATE", "FERRITIN", "TIBC", "IRON", "RETICCTPCT" in the last 72 hours. Urinalysis    Component Value Date/Time   COLORURINE YELLOW (A) 12/05/2019 0857   APPEARANCEUR Clear 06/15/2022 0926   LABSPEC 1.010 12/05/2019 0857   LABSPEC 1.027 11/13/2013 1832   PHURINE 7.0 12/05/2019 0857   GLUCOSEU Negative 06/15/2022 0926   GLUCOSEU Negative 11/13/2013 1832   HGBUR NEGATIVE 12/05/2019 0857   BILIRUBINUR Negative 06/15/2022 0926   BILIRUBINUR Negative 11/13/2013 1832   KETONESUR NEGATIVE 12/05/2019 0857   PROTEINUR Negative 06/15/2022 0926   PROTEINUR NEGATIVE 12/05/2019 0857   NITRITE Negative 06/15/2022 0926   NITRITE NEGATIVE 12/05/2019 0857   LEUKOCYTESUR Negative 06/15/2022 0926   LEUKOCYTESUR NEGATIVE 12/05/2019 0857   LEUKOCYTESUR Negative 11/13/2013 1832   Sepsis Labs Recent Labs  Lab 02/20/23 0855   WBC 8.9   Microbiology No results found for this or any previous visit (from the past 240 hour(s)).   Time coordinating discharge: Over 30 minutes  SIGNED:   Charise Killian, MD  [facility] 02/21/2023, 3:17 PM Pager   If 7PM-7AM, please contact night-coverage [email or web address]

## 2023-02-21 NOTE — Evaluation (Signed)
Physical Therapy Evaluation Patient Details Name: Carlos Richard MRN: [id number] DOB: 1932/11/12 Today's Date: 02/21/2023  History of Present Illness  Carlos Richard is a 89 y.o. male with medical history significant of hypertension, hyperlipidemia, COPD, gout, BPH, CKD-3A, essential tremor (s/p of deep brain stimulator placement), atrial fibrillation Xarelto, who presents with syncope.  Clinical Impression  Pt is a pleasant 89 year old male who was admitted for syncope. Pt performs bed mobility with cga, transfers with min assist, and ambulation with cga and SPC. Pt demonstrates deficits with balance/endurance. O2 sats decrease to 79% with exertion on RA; extended rest break for recovery. Discussed with MD. Would benefit from skilled PT to address above deficits and promote optimal return to PLOF. Will continue to maintain on PT caseload while admitted for continued therapy. Will defer to [facility] team for disposition.      Recommendations for follow up therapy are one component of a multi-disciplinary discharge planning process, led by the attending physician.  Recommendations may be updated based on patient status, additional functional criteria and insurance authorization.  Follow Up Recommendations       Assistance Recommended at Discharge Set up Supervision/Assistance  Patient can return home with the following  A little help with walking and/or transfers;A little help with bathing/dressing/bathroom;Help with stairs or ramp for entrance    Equipment Recommendations None recommended by PT  Recommendations for Other Services       Functional Status Assessment Patient has had a recent decline in their functional status and demonstrates the ability to make significant improvements in function in a reasonable and predictable amount of time.     Precautions / Restrictions Precautions Precautions: Fall Restrictions Weight Bearing Restrictions: No      Mobility  Bed Mobility Overal  bed mobility: Needs Assistance Bed Mobility: Supine to Sit, Sit to Supine     Supine to sit: Min guard Sit to supine: Supervision   General bed mobility comments: needs slight assist for trunkal elevation. Reaches for therapist hand. Once seated, upright posture    Transfers Overall transfer level: Needs assistance Equipment used: Rolling walker (2 wheels) Transfers: Sit to/from Stand Sit to Stand: Min assist           General transfer comment: several attempts to stand from low bed. Per reports, he sits on elevated surfaces in home environment. Once standing, uses SPC. Decreased eccentric control during stand-Sit needing min/mod assist    Ambulation/Gait Ambulation/Gait assistance: Min guard Gait Distance (Feet): 200 Feet Assistive device: Straight cane Gait Pattern/deviations: Step-through pattern       General Gait Details: ambulated around hallway with good speed/cadence. Generalized unsteadiness, however no formal LOB noted. O2 sats on RA decrease to 79% with exertion. Cues for PLB during ambulation with improvement to 90%.  Stairs            Wheelchair Mobility    Modified Rankin (Stroke Patients Only)       Balance Overall balance assessment: Needs assistance Sitting-balance support: Feet supported Sitting balance-Leahy Scale: Fair     Standing balance support: During functional activity, Single extremity supported Standing balance-Leahy Scale: Fair                               Pertinent Vitals/Pain Pain Assessment Pain Assessment: No/denies pain    Home Living Family/patient expects to be discharged to:: Private residence Living Arrangements: Spouse/significant other Available Help at Discharge: Family;Available 24 hours/day Type of  Home: House Home Access: Stairs to enter Entrance Stairs-Rails: Right;Left;Can reach both Entrance Stairs-Number of Steps: 5   Home Layout: One level Home Equipment: Adaptive equipment;Shower  seat;Cane - single point;Rollator (4 wheels)      Prior Function Prior Level of Function : Needs assist             Mobility Comments: Mod I with huricane, wife reports pt goes outside to pick up pine cones in the yard using his reacher ADLs Comments: Wife assists with ADLs PRN (bathing, LB dressing) and all IADLs (household chores, med mgmt, driving).     Hand Dominance   Dominant Hand: Right    Extremity/Trunk Assessment   Upper Extremity Assessment Upper Extremity Assessment: Generalized weakness (B UE grossly 4/5)    Lower Extremity Assessment Lower Extremity Assessment: Generalized weakness (B LE grossly 4-/5)       Communication   Communication: No difficulties  Cognition Arousal/Alertness: Awake/alert Behavior During Therapy: WFL for tasks assessed/performed Overall Cognitive Status: Within Functional Limits for tasks assessed                                 General Comments: pleasant and follows directions        General Comments General comments (skin integrity, edema, etc.): Pt received on RA, SpO2 90% at rest and 80% after activity. Improved with seated rest break and PLB. Pt deferred placing O2 back on for recovery. RN notified. No c/o dizziness.    Exercises     Assessment/Plan    PT Assessment Patient needs continued PT services  PT Problem List Decreased strength;Decreased activity tolerance;Decreased balance;Decreased mobility;Decreased safety awareness;Cardiopulmonary status limiting activity       PT Treatment Interventions DME instruction;Gait training;Stair training;Therapeutic exercise;Balance training    PT Goals (Current goals can be found in the Care Plan section)  Acute Rehab PT Goals Patient Stated Goal: to go home PT Goal Formulation: With patient Time For Goal Achievement: 03/07/23 Potential to Achieve Goals: Good    Frequency Min 3X/week     Co-evaluation               AM-PAC PT "6 Clicks" Mobility   Outcome Measure Help needed turning from your back to your side while in a flat bed without using bedrails?: A Little Help needed moving from lying on your back to sitting on the side of a flat bed without using bedrails?: A Little Help needed moving to and from a bed to a chair (including a wheelchair)?: A Little Help needed standing up from a chair using your arms (e.g., wheelchair or bedside chair)?: A Little Help needed to walk in hospital room?: A Little Help needed climbing 3-5 steps with a railing? : A Lot 6 Click Score: 17    End of Session   Activity Tolerance: Patient tolerated treatment well Patient left: in bed;with bed alarm set (per patient request) Nurse Communication: Mobility status PT Visit Diagnosis: Unsteadiness on feet (R26.81);Muscle weakness (generalized) (M62.81);History of falling (Z91.81);Difficulty in walking, not elsewhere classified (R26.2)    Time: 1886-7737 PT Time Calculation (min) (ACUTE ONLY): 18 min   Charges:   PT Evaluation $PT Eval Low Complexity: 1 Low PT Treatments $Gait Training: 8-22 mins        Elizabeth Palau, PT, DPT, GCS [phone]   Akira Perusse 02/21/2023, 2:32 PM

## 2023-02-22 ENCOUNTER — Telehealth: Payer: Self-pay

## 2023-02-22 DIAGNOSIS — J449 Chronic obstructive pulmonary disease, unspecified: Secondary | ICD-10-CM | POA: Diagnosis not present

## 2023-02-22 NOTE — Transitions of Care (Post Inpatient/ED Visit) (Unsigned)
   02/22/2023  Name: Carlos Richard MRN: [id number] DOB: 19-Nov-1931  Today's TOC FU Call Status: Today's TOC FU Call Status:: Unsuccessul Call (1st Attempt) Unsuccessful Call (1st Attempt) Date: 02/22/23  Attempted to reach the patient regarding the most recent Inpatient/ED visit.  Follow Up Plan: Additional outreach attempts will be made to reach the patient to complete the Transitions of Care (Post Inpatient/ED visit) call.   Signature Karena Addison, LPN [facility] Nurse Health Advisor Direct Dial [phone]

## 2023-02-23 ENCOUNTER — Telehealth: Payer: Self-pay | Admitting: Family Medicine

## 2023-02-23 NOTE — Transitions of Care (Post Inpatient/ED Visit) (Signed)
   02/23/2023  Name: Carlos Richard MRN: [id number] DOB: 03-Jul-1932  Today's TOC FU Call Status: Today's TOC FU Call Status:: Successful TOC FU Call Competed Unsuccessful Call (1st Attempt) Date: 02/22/23 [facility] FU Call Complete Date: 02/23/23  Transition Care Management Follow-up Telephone Call Date of Discharge: 02/21/23 Discharge Facility: [facility]) Type of Discharge: Inpatient Admission Primary Inpatient Discharge Diagnosis:: syncope How have you been since you were released from the hospital?: Better Any questions or concerns?: No  Items Reviewed: Did you receive and understand the discharge instructions provided?: Yes Medications obtained and verified?: Yes (Medications Reviewed) Any new allergies since your discharge?: No Dietary orders reviewed?: Yes Do you have support at home?: Yes People in Home: spouse  Home Care and Equipment/Supplies: Were Home Health Services Ordered?: NA Any new equipment or medical supplies ordered?: Yes Name of Medical supply agency?: unknown Were you able to get the equipment/medical supplies?: Yes Do you have any questions related to the use of the equipment/supplies?: No  Functional Questionnaire: Do you need assistance with bathing/showering or dressing?: No Do you need assistance with meal preparation?: No Do you need assistance with eating?: No Do you have difficulty maintaining continence: No Do you need assistance with getting out of bed/getting out of a chair/moving?: No Do you have difficulty managing or taking your medications?: No  Follow up appointments reviewed: PCP Follow-up appointment confirmed?: Yes (declined) Specialist Hospital Follow-up appointment confirmed?: Yes Date of Specialist follow-up appointment?: 03/03/23 Follow-Up Specialty Provider:: Dr Welton Flakes Do you need transportation to your follow-up appointment?: Yes Do you understand care options if your condition(s) worsen?: Yes-patient  verbalized understanding    SIGNATURE Karena Addison, LPN [facility] Nurse Health Advisor Direct Dial [phone]

## 2023-02-23 NOTE — Telephone Encounter (Signed)
Copied from CRM [id number]. Topic: General - Inquiry >> Feb 23, 2023 11:01 AM De Blanch wrote: Reason for CRM: Elmer Bales from [facility] stated needs to update the start of care date for tomorrow, April 11. Was unable to reach them all day yesterday, so was unable to see him today, but will try again tomorrow.  Please advise.

## 2023-02-24 NOTE — Telephone Encounter (Signed)
Noted  

## 2023-03-02 ENCOUNTER — Telehealth: Payer: Self-pay | Admitting: Family Medicine

## 2023-03-02 DIAGNOSIS — E785 Hyperlipidemia, unspecified: Secondary | ICD-10-CM | POA: Diagnosis not present

## 2023-03-02 DIAGNOSIS — N1831 Chronic kidney disease, stage 3a: Secondary | ICD-10-CM | POA: Diagnosis not present

## 2023-03-02 DIAGNOSIS — R55 Syncope and collapse: Secondary | ICD-10-CM | POA: Diagnosis not present

## 2023-03-02 DIAGNOSIS — Z9981 Dependence on supplemental oxygen: Secondary | ICD-10-CM | POA: Diagnosis not present

## 2023-03-02 DIAGNOSIS — I129 Hypertensive chronic kidney disease with stage 1 through stage 4 chronic kidney disease, or unspecified chronic kidney disease: Secondary | ICD-10-CM | POA: Diagnosis not present

## 2023-03-02 DIAGNOSIS — R251 Tremor, unspecified: Secondary | ICD-10-CM | POA: Diagnosis not present

## 2023-03-02 DIAGNOSIS — I482 Chronic atrial fibrillation, unspecified: Secondary | ICD-10-CM | POA: Diagnosis not present

## 2023-03-02 DIAGNOSIS — M109 Gout, unspecified: Secondary | ICD-10-CM | POA: Diagnosis not present

## 2023-03-02 DIAGNOSIS — Z7901 Long term (current) use of anticoagulants: Secondary | ICD-10-CM | POA: Diagnosis not present

## 2023-03-02 DIAGNOSIS — J449 Chronic obstructive pulmonary disease, unspecified: Secondary | ICD-10-CM | POA: Diagnosis not present

## 2023-03-02 NOTE — Telephone Encounter (Signed)
Elmer Bales called from [facility] home [facility] to request for verbal orders for physical therapy twice a week for 3 weeks and once a week for a week. Also Irving Burton would like clarification for oxygen use, she states that there are no notes to indicate how many liters patient should be using. She states that he currently using 2 litters of oxygen. Please advise Irving Burton at [phone]) [phone]. She states to leave a message if no answer.

## 2023-03-03 ENCOUNTER — Encounter: Payer: Self-pay | Admitting: Family Medicine

## 2023-03-03 ENCOUNTER — Ambulatory Visit (INDEPENDENT_AMBULATORY_CARE_PROVIDER_SITE_OTHER): Payer: Medicare Other | Admitting: Cardiovascular Disease

## 2023-03-03 ENCOUNTER — Encounter: Payer: Self-pay | Admitting: Cardiovascular Disease

## 2023-03-03 ENCOUNTER — Ambulatory Visit: Payer: Medicare Other | Admitting: Family Medicine

## 2023-03-03 VITALS — BP 132/82 | HR 72 | Ht 70.0 in | Wt 166.6 lb

## 2023-03-03 VITALS — BP 132/66 | HR 77 | Temp 98.1°F | Ht 70.0 in | Wt 166.4 lb

## 2023-03-03 DIAGNOSIS — Z9981 Dependence on supplemental oxygen: Secondary | ICD-10-CM | POA: Diagnosis not present

## 2023-03-03 DIAGNOSIS — R55 Syncope and collapse: Secondary | ICD-10-CM | POA: Diagnosis not present

## 2023-03-03 DIAGNOSIS — I129 Hypertensive chronic kidney disease with stage 1 through stage 4 chronic kidney disease, or unspecified chronic kidney disease: Secondary | ICD-10-CM

## 2023-03-03 DIAGNOSIS — D692 Other nonthrombocytopenic purpura: Secondary | ICD-10-CM | POA: Diagnosis not present

## 2023-03-03 DIAGNOSIS — I10 Essential (primary) hypertension: Secondary | ICD-10-CM

## 2023-03-03 DIAGNOSIS — I4891 Unspecified atrial fibrillation: Secondary | ICD-10-CM | POA: Diagnosis not present

## 2023-03-03 DIAGNOSIS — E782 Mixed hyperlipidemia: Secondary | ICD-10-CM | POA: Diagnosis not present

## 2023-03-03 DIAGNOSIS — N1831 Chronic kidney disease, stage 3a: Secondary | ICD-10-CM | POA: Diagnosis not present

## 2023-03-03 NOTE — Assessment & Plan Note (Signed)
O2 sat was 85% off oxygen today. Encouraged him to continue to use his oxygen at home at 2L. Continue to monitor. Call with any concerns.

## 2023-03-03 NOTE — Progress Notes (Signed)
Cardiology Office Note   Date:  03/03/2023   ID:  VIRL COBLE, DOB 1932-01-04, MRN [id number]  PCP:  Dorcas Carrow, DO  Cardiologist:  Adrian Blackwater, MD      History of Present Illness: Carlos Richard is a 89 y.o. male who presents for  Chief Complaint  Patient presents with   Follow-up    3 month follow up    Had passed out and was due to Low BP  Loss of Consciousness This is a new problem. The current episode started in the past 7 days. The problem has been resolved.      Past Medical History:  Diagnosis Date   Atrial fibrillation    COPD (chronic obstructive pulmonary disease)    Hypertension    Near syncope    Tremors of nervous system      Past Surgical History:  Procedure Laterality Date   BACK SURGERY     BRAIN SURGERY     stimulator placement for tremors   EYE SURGERY Left 2020   cataract surgery nov    EYE SURGERY Right 2020   cataract surgery dec     Current Outpatient Medications  Medication Sig Dispense Refill   allopurinol (ZYLOPRIM) 100 MG tablet Take 1 tablet (100 mg total) by mouth daily. Monday, Wednesday, Friday 90 tablet 1   fluticasone-salmeterol (ADVAIR DISKUS) 250-50 MCG/ACT AEPB INHALE 1 PUFF INTO THE LUNGS TWICE A DAY 180 each 1   furosemide (LASIX) 20 MG tablet TAKE 1 TABLET DAILY. (Patient taking differently: Take 20 mg by mouth daily. Monday, Wednesday, Friday) 90 tablet 0   losartan (COZAAR) 25 MG tablet Take 25 mg by mouth daily.     magnesium oxide (MAG-OX) 400 MG tablet Take 400 mg by mouth daily.     metoprolol succinate (TOPROL-XL) 50 MG 24 hr tablet Take 50 mg by mouth daily.     Multiple Vitamins-Minerals (ZINC PO) Take 1 tablet by mouth daily.     simvastatin (ZOCOR) 20 MG tablet Take 1 tablet (20 mg total) by mouth daily. 90 tablet 1   XARELTO 15 MG TABS tablet Take 1 tablet (15 mg total) by mouth daily with supper. 90 tablet 3   Zinc 50 MG TABS Take 50 mg by mouth daily.      triamcinolone cream (KENALOG)  0.1 % SMARTSIG:1 Application Topical 2-3 Times Daily (Patient not taking: Reported on 03/03/2023)     No current facility-administered medications for this visit.    Allergies:   Patient has no known allergies.    Social History:   reports that he quit smoking about 44 years ago. His smoking use included cigarettes. He has never used smokeless tobacco. He reports current alcohol use. He reports that he does not use drugs.   Family History:  family history includes Obesity in his sister; Tremor in his paternal grandmother; Tuberculosis in his mother.    ROS:     Review of Systems  Constitutional: Negative.   HENT: Negative.    Eyes: Negative.   Respiratory: Negative.    Cardiovascular:  Positive for syncope.  Gastrointestinal: Negative.   Genitourinary: Negative.   Musculoskeletal: Negative.   Skin: Negative.   Endo/Heme/Allergies: Negative.   Psychiatric/Behavioral: Negative.    All other systems reviewed and are negative.     All other systems are reviewed and negative.    PHYSICAL EXAM: VS:  BP 132/82   Pulse 72   Ht  (1.778 m)  Wt 166 lb 9.6 oz (75.6 kg)   SpO2 92%   BMI 23.90 kg/m  , BMI Body mass index is 23.9 kg/m. Last weight:  Wt Readings from Last 3 Encounters:  03/03/23 166 lb 9.6 oz (75.6 kg)  02/20/23 166 lb (75.3 kg)  02/14/23 166 lb (75.3 kg)     Physical Exam Vitals reviewed.  Constitutional:      Appearance: Normal appearance. He is normal weight.  HENT:     Head: Normocephalic.     Nose: Nose normal.     Mouth/Throat:     Mouth: Mucous membranes are moist.  Eyes:     Pupils: Pupils are equal, round, and reactive to light.  Cardiovascular:     Rate and Rhythm: Normal rate and regular rhythm.     Pulses: Normal pulses.     Heart sounds: Normal heart sounds.  Pulmonary:     Effort: Pulmonary effort is normal.  Abdominal:     General: Abdomen is flat. Bowel sounds are normal.  Musculoskeletal:        General: Normal range of  motion.     Cervical back: Normal range of motion.  Skin:    General: Skin is warm.  Neurological:     General: No focal deficit present.     Mental Status: He is alert.  Psychiatric:        Mood and Affect: Mood normal.       EKG:   Recent Labs: 06/15/2022: TSH 1.720 12/14/2022: ALT 19 02/20/2023: Hemoglobin 14.8; Platelets 211 02/21/2023: BUN 15; Creatinine, Ser 1.21; Potassium 3.6; Sodium 139    Lipid Panel    Component Value Date/Time   CHOL 144 12/14/2022 1454   TRIG 162 (H) 12/14/2022 1454   HDL 39 (L) 12/14/2022 1454   LDLCALC 77 12/14/2022 1454      Other studies Reviewed: Additional studies/ records that were reviewed today include:  Review of the above records demonstrates:       No data to display            ASSESSMENT AND PLAN:    ICD-10-CM   1. Atrial fibrillation, unspecified type  I48.91 PCV ECHOCARDIOGRAM COMPLETE    2. Primary hypertension  I10 PCV ECHOCARDIOGRAM COMPLETE    3. Near syncope  R55 PCV ECHOCARDIOGRAM COMPLETE    4. Chronic kidney disease, stage 3a  N18.31 PCV ECHOCARDIOGRAM COMPLETE    5. Mixed hyperlipidemia  E78.2 PCV ECHOCARDIOGRAM COMPLETE    6. Syncope, unspecified syncope type  R55 PCV ECHOCARDIOGRAM COMPLETE       Problem List Items Addressed This Visit       Cardiovascular and Mediastinum   Atrial fibrillation - Primary   Relevant Orders   PCV ECHOCARDIOGRAM COMPLETE   Near syncope   Relevant Orders   PCV ECHOCARDIOGRAM COMPLETE   HTN (hypertension)   Relevant Orders   PCV ECHOCARDIOGRAM COMPLETE     Genitourinary   Chronic kidney disease, stage 3a   Relevant Orders   PCV ECHOCARDIOGRAM COMPLETE     Other   Mixed hyperlipidemia   Relevant Orders   PCV ECHOCARDIOGRAM COMPLETE   Syncope    WAs due to hypotension, but BP stable now      Relevant Orders   PCV ECHOCARDIOGRAM COMPLETE       Disposition:   Return in about 4 weeks (around 03/31/2023) for echo and f/u.    Total time spent: 30  minutes  Signed,  Adrian Blackwater, MD  03/03/2023 10:09  AM    [facility]

## 2023-03-03 NOTE — Assessment & Plan Note (Signed)
Better on recheck and on his oxygen. Continue to monitor. Call with any concerns.

## 2023-03-03 NOTE — Progress Notes (Signed)
BP 132/66   Pulse 77   Temp 98.1 F (36.7 C)   Ht 5\' 10"  (1.778 m)   Wt 166 lb 6.4 oz (75.5 kg)   SpO2 (!) 85%   BMI 23.88 kg/m    Subjective:    Patient ID: Carlos Richard, male    DOB: [birth year], 89 y.o.   MRN: [id number]  HPI: Carlos Richard is a 89 y.o. male  Chief Complaint  Patient presents with   Hospitalization Follow-up   Transition of Care Hospital Follow up.   Hospital/Facility: [facility] D/C Physician: Dr. Mayford Knife D/C Date: 02/21/23  Records Requested: 03/03/23 Records Received: 03/03/23 Records Reviewed: 03/03/23  Diagnoses on Discharge: Syncope   Atrial fibrillation, chronic   Essential tremor   COPD (chronic obstructive pulmonary disease)   HTN (hypertension)   Gout   HLD (hyperlipidemia)   Chronic kidney disease, stage 3a  Date of interactive Contact within 48 hours of discharge: 02/22/23  and 02/23/23 Contact was through: phone  Date of 7 day or 14 day face-to-face visit:  03/03/23  within 14 days  Outpatient Encounter Medications as of 03/03/2023  Medication Sig   allopurinol (ZYLOPRIM) 100 MG tablet Take 1 tablet (100 mg total) by mouth daily. Monday, Wednesday, Friday   fluticasone-salmeterol (ADVAIR DISKUS) 250-50 MCG/ACT AEPB INHALE 1 PUFF INTO THE LUNGS TWICE A DAY   furosemide (LASIX) 20 MG tablet TAKE 1 TABLET DAILY. (Patient taking differently: Take 20 mg by mouth daily. Monday, Wednesday, Friday)   losartan (COZAAR) 25 MG tablet Take 25 mg by mouth daily.   magnesium oxide (MAG-OX) 400 MG tablet Take 400 mg by mouth daily.   metoprolol succinate (TOPROL-XL) 50 MG 24 hr tablet Take 50 mg by mouth daily.   Multiple Vitamins-Minerals (ZINC PO) Take 1 tablet by mouth daily.   simvastatin (ZOCOR) 20 MG tablet Take 1 tablet (20 mg total) by mouth daily.   XARELTO 15 MG TABS tablet Take 1 tablet (15 mg total) by mouth daily with supper.   Zinc 50 MG TABS Take 50 mg by mouth daily.    [DISCONTINUED] triamcinolone cream (KENALOG) 0.1 % SMARTSIG:1  Application Topical 2-3 Times Daily (Patient not taking: Reported on 03/03/2023)   No facility-administered encounter medications on file as of 03/03/2023.  Per Hospitalist: "Carlos Richard is a 89 y.o. male with medical history significant of hypertension, hyperlipidemia, COPD, gout, BPH, CKD-3A, essential tremor (s/p of deep brain stimulator placement), atrial fibrillation Xarelto, who presents with syncope.   Per his wife at the bedside, pt was in his overall usual state of health this morning.  He had a bowel movement, then set up to begin a shower. After  taking shower, pt began to stare off, started smacking his lips, then passed out for few seconds. Wife slowly lowered him to the ground, no injury.  Patient does not have unilateral numbness or tinglings in extremities.  No facial droop or slurred speech.  Denies chest pain, cough, shortness breath.  No fever or chills.  No nausea vomiting, diarrhea or abdominal pain.  No symptoms of UTI.  Per ED physician, patient was almost cleared for discharge, but pt had another syncope episode in the ED, which lasted few seconds, no seizure activity noted in ED. He took his Xarelto yesterday.   Data reviewed independently and ED Course: pt was found to have WBC 8.9, GFR> 60, temperature normal, blood pressure 193/99, heart rate 56, RR 27, oxygen saturation 94% on room air.  CT head  negative.  CT of C-spine negative for acute injury but showed degenerative disc disease.  Patient is placed on telemetry bed for observation.  Syncope:  etiology unclear, likely secondary orthostatic hypotension. Ddx: vasovagal syncope, TIA, arrhythmia, orthostatic hypotension. Pt had another episode of syncope in the ED which was witnessed, no seizure activity noted. CT head negative. Cannot do MRI for brain since patient has deep brain stimulator placement. Orthostatic vitals were positive on day of admission & repeat orthostatic vitals are neg today 02/21/23. Echo shows EF 60-65%,  normal diastolic function, no regional wall motion abnormalities. Therapy recs home health. Continue on IVFs.    Chronic a. fib: continue on metoprolol, xarelto    Essential tremor: s/p placement of deep brain stimulator    COPD: w/o exacerbation. Continue on bronchodilators   HTN: continue on home dose of metoprolol, losartan. Holding home dose of lasix secondary to syncope. IV hydralazine prn    Gout: continue on home dose of allopurinol    HLD: continue on statin    CKDIIIa: Cr is stable. "  Diagnostic Tests Reviewed:  CLINICAL DATA:  Syncopal episode.  Loss of consciousness with fall.   EXAM: CHEST - 2 VIEW   COMPARISON:  05/04/2020   FINDINGS: The heart size and mediastinal contours are within normal limits. Changes of COPD are seen. Stable mild parenchymal scarring also noted bilaterally. No evidence of acute infiltrate or edema. No evidence of pleural effusion. Neurostimulator device again seen in the left chest wall, with leads extending cephalad in the left neck.   IMPRESSION: COPD. No active cardiopulmonary disease.  CLINICAL DATA:  Head trauma after syncopal episode this morning in shower. Patient does not remember falling.   EXAM: CT HEAD WITHOUT CONTRAST   TECHNIQUE: Contiguous axial images were obtained from the base of the skull through the vertex without intravenous contrast.   RADIATION DOSE REDUCTION: This exam was performed according to the departmental dose-optimization program which includes automated exposure control, adjustment of the mA and/or kV according to patient size and/or use of iterative reconstruction technique.   COMPARISON:  12/05/2019   FINDINGS: Brain: Stable left frontoparietal deep brain stimulator device with tip over the region of the left thalamus. Ventricles, cisterns and other CSF spaces are otherwise unremarkable. There is moderate chronic ischemic microvascular disease. There is no mass, mass effect, shift of  midline structures or acute hemorrhage. No evidence of acute infarction.   Vascular: No hyperdense vessel or unexpected calcification.   Skull: Normal. Negative for fracture or focal lesion.   Sinuses/Orbits: No acute finding.   Other: None.   IMPRESSION: 1. No acute findings. 2. Moderate chronic ischemic microvascular disease. 3. Stable left frontoparietal deep brain stimulator device with tip over the region of the left thalamus.  CLINICAL DATA:  88 year old male with history of syncope. Neck trauma.   EXAM: CT CERVICAL SPINE WITHOUT CONTRAST   TECHNIQUE: Multidetector CT imaging of the cervical spine was performed without intravenous contrast. Multiplanar CT image reconstructions were also generated.   RADIATION DOSE REDUCTION: This exam was performed according to the departmental dose-optimization program which includes automated exposure control, adjustment of the mA and/or kV according to patient size and/or use of iterative reconstruction technique.   COMPARISON:  Cervical spine CT scan 04/29/2015.   FINDINGS: Alignment: Normal.   Skull base and vertebrae: No acute fracture. No primary bone lesion or focal pathologic process. Chronic appearing compression fracture of T3 with 25% loss of anterior vertebral body height.   Soft tissues and  spinal canal: No prevertebral fluid or swelling. No visible canal hematoma.   Disc levels: Mild multilevel degenerative disc disease, most severe at C7-T1. Moderate multilevel facet arthropathy.   Upper chest: Lead from deep brain stimulator noted in the soft tissues of the left anterior chest wall and left cervical region. Emphysema.   Other: None.   IMPRESSION: 1. No evidence of significant acute traumatic injury to the cervical spine. 2. Multilevel degenerative disc disease and cervical spondylosis, as above.  Disposition: Home with Home health  Consults: None  Discharge Instructions: Follow up here and with  cardiology  Disease/illness Education: Discussed today  Home Health/Community Services Discussions/Referrals: In place  Establishment or re-establishment of referral orders for community resources: In place  Discussion with other health care providers:  None  Assessment and Support of treatment regimen adherence: Good  Appointments Coordinated with: Patient and wife  Education for self-management, independent living, and ADLs:  Discussed today  Since getting out of the hospital Carlos Richard has been feeling OK. He saw cardiology today and BP was great when he was there. They are scheduling a echo. He denies any CP. No SOB. No fatigue. He has been walking. He has bene using his oxygen as needed. Home health has come out. No other concerns or complaints at this time.   Relevant past medical, surgical, family and social history reviewed and updated as indicated. Interim medical history since our last visit reviewed. Allergies and medications reviewed and updated.  Review of Systems  Constitutional: Negative.   HENT: Negative.    Respiratory: Negative.    Cardiovascular: Negative.   Gastrointestinal: Negative.   Endocrine: Negative.   Genitourinary: Negative.   Neurological: Negative.   Hematological: Negative.   Psychiatric/Behavioral: Negative.      Per HPI unless specifically indicated above     Objective:    BP 132/66   Pulse 77   Temp 98.1 F (36.7 C)   Ht 5\' 10"  (1.778 m)   Wt 166 lb 6.4 oz (75.5 kg)   SpO2 (!) 85%   BMI 23.88 kg/m   Wt Readings from Last 3 Encounters:  03/03/23 166 lb 6.4 oz (75.5 kg)  03/03/23 166 lb 9.6 oz (75.6 kg)  02/20/23 166 lb (75.3 kg)    Physical Exam Vitals and nursing note reviewed.  Constitutional:      General: He is not in acute distress.    Appearance: Normal appearance. He is normal weight. He is not ill-appearing, toxic-appearing or diaphoretic.  HENT:     Head: Normocephalic and atraumatic.     Right Ear: External ear  normal.     Left Ear: External ear normal.     Nose: Nose normal.     Mouth/Throat:     Mouth: Mucous membranes are moist.     Pharynx: Oropharynx is clear.  Eyes:     General: No scleral icterus.       Right eye: No discharge.        Left eye: No discharge.     Extraocular Movements: Extraocular movements intact.     Conjunctiva/sclera: Conjunctivae normal.     Pupils: Pupils are equal, round, and reactive to light.  Cardiovascular:     Rate and Rhythm: Normal rate and regular rhythm.     Pulses: Normal pulses.     Heart sounds: Normal heart sounds. No murmur heard.    No friction rub. No gallop.  Pulmonary:     Effort: Pulmonary effort is normal. No respiratory distress.  Breath sounds: Normal breath sounds. No stridor. No wheezing, rhonchi or rales.  Chest:     Chest wall: No tenderness.  Musculoskeletal:        General: Normal range of motion.     Cervical back: Normal range of motion and neck supple.  Skin:    General: Skin is warm and dry.     Capillary Refill: Capillary refill takes less than 2 seconds.     Coloration: Skin is not jaundiced or pale.     Findings: No bruising, erythema, lesion or rash.  Neurological:     General: No focal deficit present.     Mental Status: He is alert and oriented to person, place, and time. Mental status is at baseline.  Psychiatric:        Mood and Affect: Mood normal.        Behavior: Behavior normal.        Thought Content: Thought content normal.        Judgment: Judgment normal.     Results for orders placed or performed during the hospital encounter of 02/20/23  Basic metabolic panel  Result Value Ref Range   Sodium 141 135 - 145 mmol/L   Potassium 4.0 3.5 - 5.1 mmol/L   Chloride 106 98 - 111 mmol/L   CO2 27 22 - 32 mmol/L   Glucose, Bld 113 (H) 70 - 99 mg/dL   BUN 10 8 - 23 mg/dL   Creatinine, Ser 1.61 0.61 - 1.24 mg/dL   Calcium 8.8 (L) 8.9 - 10.3 mg/dL   GFR, Estimated >09 >60 mL/min   Anion gap 8 5 - 15  CBC   Result Value Ref Range   WBC 8.9 4.0 - 10.5 K/uL   RBC 4.90 4.22 - 5.81 MIL/uL   Hemoglobin 14.8 13.0 - 17.0 g/dL   HCT 45.4 09.8 - 11.9 %   MCV 92.7 80.0 - 100.0 fL   MCH 30.2 26.0 - 34.0 pg   MCHC 32.6 30.0 - 36.0 g/dL   RDW 14.7 82.9 - 56.2 %   Platelets 211 150 - 400 K/uL   nRBC 0.2 0.0 - 0.2 %  Basic metabolic panel  Result Value Ref Range   Sodium 139 135 - 145 mmol/L   Potassium 3.6 3.5 - 5.1 mmol/L   Chloride 108 98 - 111 mmol/L   CO2 26 22 - 32 mmol/L   Glucose, Bld 105 (H) 70 - 99 mg/dL   BUN 15 8 - 23 mg/dL   Creatinine, Ser 1.30 0.61 - 1.24 mg/dL   Calcium 8.2 (L) 8.9 - 10.3 mg/dL   GFR, Estimated 57 (L) >60 mL/min   Anion gap 5 5 - 15  ECHOCARDIOGRAM COMPLETE  Result Value Ref Range   Weight 2,656 oz   Height 70 in   BP 126/79 mmHg   Ao pk vel 1.54 m/s   AV Area VTI 2.56 cm2   AR max vel 2.37 cm2   AV Mean grad 5.5 mmHg   AV Peak grad 9.4 mmHg   S' Lateral 2.60 cm   AV Area mean vel 2.46 cm2   Area-P 1/2 3.50 cm2   P 1/2 time 543 msec   MV VTI 2.81 cm2   Est EF 60 - 65%   Troponin I (High Sensitivity)  Result Value Ref Range   Troponin I (High Sensitivity) 9 <18 ng/L  Troponin I (High Sensitivity)  Result Value Ref Range   Troponin I (High Sensitivity) 6 <18 ng/L  Assessment & Plan:   Problem List Items Addressed This Visit       Cardiovascular and Mediastinum   Senile purpura    Reassured patient. Continue to monitor. Call with any concerns.         Genitourinary   Benign hypertensive renal disease    Better on recheck and on his oxygen. Continue to monitor. Call with any concerns.         Other   Syncope - Primary    Continue to follow with cardiology. Due for echo shortly. Continue hydration. Labs drawn today. Continue to monitor.       Relevant Orders   CBC with Differential/Platelet   Basic metabolic panel   TSH   Oxygen dependent    O2 sat was 85% off oxygen today. Encouraged him to continue to use his oxygen at home  at 2L. Continue to monitor. Call with any concerns.         Follow up plan: Return As scheduled.

## 2023-03-03 NOTE — Assessment & Plan Note (Signed)
WAs due to hypotension, but BP stable now

## 2023-03-03 NOTE — Telephone Encounter (Signed)
Irving Burton notified of Dr. Henriette Combs message regarding the patient's oxygen.

## 2023-03-03 NOTE — Telephone Encounter (Signed)
Irving Burton with [facility] given verbal orders per PCP for PT. Asking for verification on oxygen orders.

## 2023-03-03 NOTE — Assessment & Plan Note (Signed)
Continue to follow with cardiology. Due for echo shortly. Continue hydration. Labs drawn today. Continue to monitor.

## 2023-03-03 NOTE — Telephone Encounter (Signed)
Oxygen is 2L continuous

## 2023-03-03 NOTE — Telephone Encounter (Signed)
Discharged from hospital 4/8- needs to be seen sooner than 4/30. ASAP hospital follow up please.   OK for verbal orders, but will need to look for oxygen

## 2023-03-03 NOTE — Assessment & Plan Note (Signed)
Reassured patient. Continue to monitor. Call with any concerns.  

## 2023-03-04 DIAGNOSIS — Z7901 Long term (current) use of anticoagulants: Secondary | ICD-10-CM | POA: Diagnosis not present

## 2023-03-04 DIAGNOSIS — Z9981 Dependence on supplemental oxygen: Secondary | ICD-10-CM | POA: Diagnosis not present

## 2023-03-04 DIAGNOSIS — R55 Syncope and collapse: Secondary | ICD-10-CM | POA: Diagnosis not present

## 2023-03-04 DIAGNOSIS — N1831 Chronic kidney disease, stage 3a: Secondary | ICD-10-CM | POA: Diagnosis not present

## 2023-03-04 DIAGNOSIS — I129 Hypertensive chronic kidney disease with stage 1 through stage 4 chronic kidney disease, or unspecified chronic kidney disease: Secondary | ICD-10-CM | POA: Diagnosis not present

## 2023-03-04 DIAGNOSIS — M109 Gout, unspecified: Secondary | ICD-10-CM | POA: Diagnosis not present

## 2023-03-04 DIAGNOSIS — R251 Tremor, unspecified: Secondary | ICD-10-CM | POA: Diagnosis not present

## 2023-03-04 DIAGNOSIS — I482 Chronic atrial fibrillation, unspecified: Secondary | ICD-10-CM | POA: Diagnosis not present

## 2023-03-04 DIAGNOSIS — J449 Chronic obstructive pulmonary disease, unspecified: Secondary | ICD-10-CM | POA: Diagnosis not present

## 2023-03-04 DIAGNOSIS — E785 Hyperlipidemia, unspecified: Secondary | ICD-10-CM | POA: Diagnosis not present

## 2023-03-04 LAB — BASIC METABOLIC PANEL
BUN/Creatinine Ratio: 9 — ABNORMAL LOW (ref 10–24)
BUN: 10 mg/dL (ref 8–27)
CO2: 22 mmol/L (ref 20–29)
Calcium: 9.1 mg/dL (ref 8.6–10.2)
Chloride: 107 mmol/L — ABNORMAL HIGH (ref 96–106)
Creatinine, Ser: 1.07 mg/dL (ref 0.76–1.27)
Glucose: 112 mg/dL — ABNORMAL HIGH (ref 70–99)
Potassium: 3.9 mmol/L (ref 3.5–5.2)
Sodium: 145 mmol/L — ABNORMAL HIGH (ref 134–144)
eGFR: 66 mL/min/{1.73_m2} (ref >59)

## 2023-03-04 LAB — CBC WITH DIFFERENTIAL/PLATELET
Basophils Absolute: 0.1 10*3/uL (ref 0.0–0.2)
Basos: 1 %
EOS (ABSOLUTE): 0.3 10*3/uL (ref 0.0–0.4)
Eos: 4 %
Hematocrit: 41.8 % (ref 37.5–51.0)
Hemoglobin: 13.8 g/dL (ref 13.0–17.7)
Immature Grans (Abs): 0 10*3/uL (ref 0.0–0.1)
Immature Granulocytes: 0 %
Lymphocytes Absolute: 2.9 10*3/uL (ref 0.7–3.1)
Lymphs: 30 %
MCH: 30.3 pg (ref 26.6–33.0)
MCHC: 33 g/dL (ref 31.5–35.7)
MCV: 92 fL (ref 79–97)
Monocytes Absolute: 0.8 10*3/uL (ref 0.1–0.9)
Monocytes: 8 %
Neutrophils Absolute: 5.4 10*3/uL (ref 1.4–7.0)
Neutrophils: 57 %
Platelets: 227 10*3/uL (ref 150–450)
RBC: 4.55 x10E6/uL (ref 4.14–5.80)
RDW: 13.1 % (ref 11.6–15.4)
WBC: 9.5 10*3/uL (ref 3.4–10.8)

## 2023-03-04 LAB — TSH: TSH: 1.28 u[IU]/mL (ref 0.450–4.500)

## 2023-03-07 DIAGNOSIS — N1831 Chronic kidney disease, stage 3a: Secondary | ICD-10-CM | POA: Diagnosis not present

## 2023-03-07 DIAGNOSIS — R251 Tremor, unspecified: Secondary | ICD-10-CM | POA: Diagnosis not present

## 2023-03-07 DIAGNOSIS — M109 Gout, unspecified: Secondary | ICD-10-CM | POA: Diagnosis not present

## 2023-03-07 DIAGNOSIS — Z7901 Long term (current) use of anticoagulants: Secondary | ICD-10-CM | POA: Diagnosis not present

## 2023-03-07 DIAGNOSIS — J449 Chronic obstructive pulmonary disease, unspecified: Secondary | ICD-10-CM | POA: Diagnosis not present

## 2023-03-07 DIAGNOSIS — E785 Hyperlipidemia, unspecified: Secondary | ICD-10-CM | POA: Diagnosis not present

## 2023-03-07 DIAGNOSIS — Z9981 Dependence on supplemental oxygen: Secondary | ICD-10-CM | POA: Diagnosis not present

## 2023-03-07 DIAGNOSIS — R55 Syncope and collapse: Secondary | ICD-10-CM | POA: Diagnosis not present

## 2023-03-07 DIAGNOSIS — I129 Hypertensive chronic kidney disease with stage 1 through stage 4 chronic kidney disease, or unspecified chronic kidney disease: Secondary | ICD-10-CM | POA: Diagnosis not present

## 2023-03-07 DIAGNOSIS — I482 Chronic atrial fibrillation, unspecified: Secondary | ICD-10-CM | POA: Diagnosis not present

## 2023-03-08 ENCOUNTER — Ambulatory Visit (INDEPENDENT_AMBULATORY_CARE_PROVIDER_SITE_OTHER): Payer: Medicare Other

## 2023-03-08 ENCOUNTER — Ambulatory Visit: Payer: Self-pay | Admitting: *Deleted

## 2023-03-08 ENCOUNTER — Encounter: Payer: Self-pay | Admitting: *Deleted

## 2023-03-08 VITALS — BP 140/74 | Ht 70.0 in | Wt 163.6 lb

## 2023-03-08 DIAGNOSIS — Z0000 Encounter for general adult medical examination without abnormal findings: Secondary | ICD-10-CM

## 2023-03-08 NOTE — Patient Instructions (Signed)
[name] , Thank you for taking time to come for your Medicare Wellness Visit. I appreciate your ongoing commitment to your health goals. Please review the following plan we discussed and let me know if I can assist you in the future.   These are the goals we discussed:  Goals      DIET - INCREASE WATER INTAKE     Patient Stated     02/16/2021, stay alive     Patient Stated     No goals        This is a list of the screening recommended for you and due dates:  Health Maintenance  Topic Date Due   COVID-19 Vaccine (4 - 2023-24 season) 07/16/2022   Flu Shot  06/16/2023   Medicare Annual Wellness Visit  03/07/2024   DTaP/Tdap/Td vaccine (3 - Td or Tdap) 07/19/2031   Pneumonia Vaccine  Completed   Zoster (Shingles) Vaccine  Completed   HPV Vaccine  Aged Out    Advanced directives: no  Conditions/risks identified: none  Next appointment: Follow up in one year for your annual wellness visit. 03/13/24 @ 8:15 am in person  Preventive Care 65 Years and Older, Male  Preventive care refers to lifestyle choices and visits with your health care provider that can promote health and wellness. What does preventive care include? A yearly physical exam. This is also called an annual well check. Dental exams once or twice a year. Routine eye exams. Ask your health care provider how often you should have your eyes checked. Personal lifestyle choices, including: Daily care of your teeth and gums. Regular physical activity. Eating a healthy diet. Avoiding tobacco and drug use. Limiting alcohol use. Practicing safe sex. Taking low doses of aspirin every day. Taking vitamin and mineral supplements as recommended by your health care provider. What happens during an annual well check? The services and screenings done by your health care provider during your annual well check will depend on your age, overall health, lifestyle risk factors, and family history of disease. Counseling  Your  health care provider may ask you questions about your: Alcohol use. Tobacco use. Drug use. Emotional well-being. Home and relationship well-being. Sexual activity. Eating habits. History of falls. Memory and ability to understand (cognition). Work and work Astronomer. Screening  You may have the following tests or measurements: Height, weight, and BMI. Blood pressure. Lipid and cholesterol levels. These may be checked every 5 years, or more frequently if you are over 64 years old. Skin check. Lung cancer screening. You may have this screening every year starting at age 72 if you have a 30-pack-year history of smoking and currently smoke or have quit within the past 15 years. Fecal occult blood test (FOBT) of the stool. You may have this test every year starting at age 28. Flexible sigmoidoscopy or colonoscopy. You may have a sigmoidoscopy every 5 years or a colonoscopy every 10 years starting at age 51. Prostate cancer screening. Recommendations will vary depending on your family history and other risks. Hepatitis C blood test. Hepatitis B blood test. Sexually transmitted disease (STD) testing. Diabetes screening. This is done by checking your blood sugar (glucose) after you have not eaten for a while (fasting). You may have this done every 1-3 years. Abdominal aortic aneurysm (AAA) screening. You may need this if you are a current or former smoker. Osteoporosis. You may be screened starting at age 99 if you are at high risk. Talk with your health care provider about your  test results, treatment options, and if necessary, the need for more tests. Vaccines  Your health care provider may recommend certain vaccines, such as: Influenza vaccine. This is recommended every year. Tetanus, diphtheria, and acellular pertussis (Tdap, Td) vaccine. You may need a Td booster every 10 years. Zoster vaccine. You may need this after age 67. Pneumococcal 13-valent conjugate (PCV13) vaccine. One dose  is recommended after age 71. Pneumococcal polysaccharide (PPSV23) vaccine. One dose is recommended after age 59. Talk to your health care provider about which screenings and vaccines you need and how often you need them. This information is not intended to replace advice given to you by your health care provider. Make sure you discuss any questions you have with your health care provider. Document Released: 11/28/2015 Document Revised: 07/21/2016 Document Reviewed: 09/02/2015 Elsevier Interactive Patient Education  2017 [employer].  Fall Prevention in the Home Falls can cause injuries. They can happen to people of all ages. There are many things you can do to make your home safe and to help prevent falls. What can I do on the outside of my home? Regularly fix the edges of walkways and driveways and fix any cracks. Remove anything that might make you trip as you walk through a door, such as a raised step or threshold. Trim any bushes or trees on the path to your home. Use bright outdoor lighting. Clear any walking paths of anything that might make someone trip, such as rocks or tools. Regularly check to see if handrails are loose or broken. Make sure that both sides of any steps have handrails. Any raised decks and porches should have guardrails on the edges. Have any leaves, snow, or ice cleared regularly. Use sand or salt on walking paths during winter. Clean up any spills in your garage right away. This includes oil or grease spills. What can I do in the bathroom? Use night lights. Install grab bars by the toilet and in the tub and shower. Do not use towel bars as grab bars. Use non-skid mats or decals in the tub or shower. If you need to sit down in the shower, use a plastic, non-slip stool. Keep the floor dry. Clean up any water that spills on the floor as soon as it happens. Remove soap buildup in the tub or shower regularly. Attach bath mats securely with double-sided non-slip rug  tape. Do not have throw rugs and other things on the floor that can make you trip. What can I do in the bedroom? Use night lights. Make sure that you have a light by your bed that is easy to reach. Do not use any sheets or blankets that are too big for your bed. They should not hang down onto the floor. Have a firm chair that has side arms. You can use this for support while you get dressed. Do not have throw rugs and other things on the floor that can make you trip. What can I do in the kitchen? Clean up any spills right away. Avoid walking on wet floors. Keep items that you use a lot in easy-to-reach places. If you need to reach something above you, use a strong step stool that has a grab bar. Keep electrical cords out of the way. Do not use floor polish or wax that makes floors slippery. If you must use wax, use non-skid floor wax. Do not have throw rugs and other things on the floor that can make you trip. What can I do with my  stairs? Do not leave any items on the stairs. Make sure that there are handrails on both sides of the stairs and use them. Fix handrails that are broken or loose. Make sure that handrails are as long as the stairways. Check any carpeting to make sure that it is firmly attached to the stairs. Fix any carpet that is loose or worn. Avoid having throw rugs at the top or bottom of the stairs. If you do have throw rugs, attach them to the floor with carpet tape. Make sure that you have a light switch at the top of the stairs and the bottom of the stairs. If you do not have them, ask someone to add them for you. What else can I do to help prevent falls? Wear shoes that: Do not have high heels. Have rubber bottoms. Are comfortable and fit you well. Are closed at the toe. Do not wear sandals. If you use a stepladder: Make sure that it is fully opened. Do not climb a closed stepladder. Make sure that both sides of the stepladder are locked into place. Ask someone to  hold it for you, if possible. Clearly mark and make sure that you can see: Any grab bars or handrails. First and last steps. Where the edge of each step is. Use tools that help you move around (mobility aids) if they are needed. These include: Canes. Walkers. Scooters. Crutches. Turn on the lights when you go into a dark area. Replace any light bulbs as soon as they burn out. Set up your furniture so you have a clear path. Avoid moving your furniture around. If any of your floors are uneven, fix them. If there are any pets around you, be aware of where they are. Review your medicines with your doctor. Some medicines can make you feel dizzy. This can increase your chance of falling. Ask your doctor what other things that you can do to help prevent falls. This information is not intended to replace advice given to you by your health care provider. Make sure you discuss any questions you have with your health care provider. Document Released: 08/28/2009 Document Revised: 04/08/2016 Document Reviewed: 12/06/2014 Elsevier Interactive Patient Education  2017 [employer].

## 2023-03-08 NOTE — Telephone Encounter (Signed)
Oxygen levels on oxygen are normal. He is supposed to be on continuous oxygen. HR is normal. If he would like to be seen we can see about getting him in, unfortunately I don't have anything today.

## 2023-03-08 NOTE — Telephone Encounter (Signed)
This encounter was created in error - please disregard.

## 2023-03-08 NOTE — Telephone Encounter (Signed)
Patient's wife on DPR called back to see if PCP responded to previous request regarding patient fluctuation O2 sats from 88-93% on room air. Reports patient wore O2 at 2 L/min [place] for approx 1 hour and sats increased to 91-93%. Hx COPD. Patient denies chest pain no SOB no sweating . Reported nausea after walking from [facility] this am did not state how far walk was. Patient's wife will go to work at 1:45 pm today and wondering if she should stay home with patient . Reports patient can call for assist if needed. Please advise if appt needed. Appt already scheduled for 03/15/23. Recommended if sx worsen go to ED.

## 2023-03-08 NOTE — Progress Notes (Signed)
Subjective:   Carlos Richard is a 89 y.o. male who presents for Medicare Annual/Subsequent preventive examination.  Review of Systems     Cardiac Risk Factors include: advanced age (>54men, >62 women);male gender;hypertension;sedentary lifestyle     Objective:    Today's Vitals   03/08/23 0836  BP: (!) 152/70  Weight: 163 lb 9.6 oz (74.2 kg)  Height: 5\' 10"  (1.778 m)   Body mass index is 23.47 kg/m.     03/08/2023    8:23 AM 02/20/2023    3:27 PM 02/20/2023    8:54 AM 02/17/2022   10:17 AM 02/16/2021    2:35 PM 04/22/2020    9:38 PM 02/04/2020    9:56 AM  Advanced Directives  Does Patient Have a Medical Advance Directive? No  No No No No No  Would patient like information on creating a medical advance directive? No - Patient declined No - Patient declined  No - Patient declined       Current Medications (verified) Outpatient Encounter Medications as of 03/08/2023  Medication Sig   allopurinol (ZYLOPRIM) 100 MG tablet Take 1 tablet (100 mg total) by mouth daily. Monday, Wednesday, Friday   fluticasone-salmeterol (ADVAIR DISKUS) 250-50 MCG/ACT AEPB INHALE 1 PUFF INTO THE LUNGS TWICE A DAY   furosemide (LASIX) 20 MG tablet TAKE 1 TABLET DAILY. (Patient taking differently: Take 20 mg by mouth daily. Monday, Wednesday, Friday)   losartan (COZAAR) 25 MG tablet Take 25 mg by mouth daily.   magnesium oxide (MAG-OX) 400 MG tablet Take 400 mg by mouth daily.   metoprolol succinate (TOPROL-XL) 50 MG 24 hr tablet Take 50 mg by mouth daily.   Multiple Vitamins-Minerals (ZINC PO) Take 1 tablet by mouth daily.   simvastatin (ZOCOR) 20 MG tablet Take 1 tablet (20 mg total) by mouth daily.   XARELTO 15 MG TABS tablet Take 1 tablet (15 mg total) by mouth daily with supper.   Zinc 50 MG TABS Take 50 mg by mouth daily.    No facility-administered encounter medications on file as of 03/08/2023.    Allergies (verified) Patient has no known allergies.   History: Past Medical History:   Diagnosis Date   Atrial fibrillation    COPD (chronic obstructive pulmonary disease)    Hypertension    Near syncope    Tremors of nervous system    Past Surgical History:  Procedure Laterality Date   BACK SURGERY     BRAIN SURGERY     stimulator placement for tremors   EYE SURGERY Left 2020   cataract surgery nov    EYE SURGERY Right 2020   cataract surgery dec   Family History  Problem Relation Age of Onset   Tuberculosis Mother    Tremor Paternal Grandmother    Obesity Sister    Prostate cancer Neg Hx    Bladder Cancer Neg Hx    Kidney cancer Neg Hx    Social History   Socioeconomic History   Marital status: Married    Spouse name: Not on file   Number of children: Not on file   Years of education: Not on file   Highest education level: Associate degree: academic program  Occupational History   Not on file  Tobacco Use   Smoking status: Former    Types: Cigarettes    Quit date: 1980    Years since quitting: 44.3   Smokeless tobacco: Never   Tobacco comments:    quit 40 years ago   Psychologist, educational  Use   Vaping Use: Never used  Substance and Sexual Activity   Alcohol use: Yes    Comment: occasionally   Drug use: No   Sexual activity: Yes  Other Topics Concern   Not on file  Social History Narrative   Not on file   Social Determinants of Health   Financial Resource Strain: Low Risk  (03/08/2023)   Overall Financial Resource Strain (CARDIA)    Difficulty of Paying Living Expenses: Not hard at all  Food Insecurity: No Food Insecurity (03/08/2023)   Hunger Vital Sign    Worried About Running Out of Food in the Last Year: Never true    Ran Out of Food in the Last Year: Never true  Transportation Needs: No Transportation Needs (03/08/2023)   PRAPARE - Administrator, Civil Service (Medical): No    Lack of Transportation (Non-Medical): No  Physical Activity: Insufficiently Active (03/08/2023)   Exercise Vital Sign    Days of Exercise per Week: 2 days     Minutes of Exercise per Session: 20 min  Stress: No Stress Concern Present (03/08/2023)   [employer] of Occupational Health - Occupational Stress Questionnaire    Feeling of Stress : Not at all  Social Connections: Moderately Isolated (03/08/2023)   Social Connection and Isolation Panel [NHANES]    Frequency of Communication with Friends and Family: More than three times a week    Frequency of Social Gatherings with Friends and Family: Never    Attends Religious Services: Never    Diplomatic Services operational officer: No    Attends Engineer, structural: Never    Marital Status: Married    Tobacco Counseling Counseling given: Not Answered Tobacco comments: quit 40 years ago    Clinical Intake:  Pre-visit preparation completed: Yes  Pain : No/denies pain     Nutritional Risks: None Diabetes: No  How often do you need to have someone help you when you read instructions, pamphlets, or other written materials from your doctor or pharmacy?: 1 - Never  Diabetic?no  Interpreter Needed?: No  Information entered by :: Kennedy Bucker, LPN   Activities of Daily Living    03/08/2023    8:24 AM 02/20/2023    3:00 PM  In your present state of health, do you have any difficulty performing the following activities:  Hearing? 1 1  Vision? 0 0  Difficulty concentrating or making decisions? 0 1  Walking or climbing stairs? 0 1  Dressing or bathing? 0 1  Doing errands, shopping? 0 1  Preparing Food and eating ? N   Using the Toilet? N   In the past six months, have you accidently leaked urine? N   Do you have problems with loss of bowel control? N   Managing your Medications? N   Managing your Finances? N   Housekeeping or managing your Housekeeping? N     Patient Care Team: Dorcas Carrow, DO as PCP - General (Family Medicine)  Indicate any recent Medical Services you may have received from other than [facility] providers in the past year (date may be  approximate).     Assessment:   This is a routine wellness examination for [facility]  Hearing/Vision screen Hearing Screening - Comments:: No aids Vision Screening - Comments:: Readers- Dr[facility]Bryan in [facility]  Dietary issues and exercise activities discussed: Current Exercise Habits: Home exercise routine, Type of exercise: walking, Time (Minutes): 20, Frequency (Times/Week): 2, Weekly Exercise (Minutes/Week): 40, Intensity: Mild  Goals Addressed             This Visit's Progress    DIET - INCREASE WATER INTAKE         Depression Screen    03/08/2023    8:21 AM 03/03/2023   10:51 AM 02/14/2023    9:05 AM 06/15/2022    9:01 AM 02/17/2022   10:24 AM 06/01/2021    3:09 PM 02/16/2021    2:36 PM  PHQ 2/9 Scores  PHQ - 2 Score 0 0 0 0 0 0 0  PHQ- 9 Score 0 0 0 0       Fall Risk    03/08/2023    8:24 AM 03/03/2023   10:52 AM 02/14/2023    9:03 AM 02/17/2022   10:17 AM 06/01/2021    3:09 PM  Fall Risk   Falls in the past year? 1 0 0 0 0  Number falls in past yr: 0  0 0 0  Injury with Fall? 0  0 0 0  Risk for fall due to : Impaired balance/gait;Impaired mobility  No Fall Risks Impaired balance/gait No Fall Risks  Follow up Falls evaluation completed;Falls prevention discussed  Falls evaluation completed Falls evaluation completed;Education provided;Falls prevention discussed Falls evaluation completed    FALL RISK PREVENTION PERTAINING TO THE HOME:  Any stairs in or around the home? Yes  If so, are there any without handrails? No  Home free of loose throw rugs in walkways, pet beds, electrical cords, etc? Yes  Adequate lighting in your home to reduce risk of falls? Yes   ASSISTIVE DEVICES UTILIZED TO PREVENT FALLS:  Life alert? No  Use of a cane, walker or w/c? Yes - cane Grab bars in the bathroom? Yes  Shower chair or bench in shower? Yes  Elevated toilet seat or a handicapped toilet? No   TIMED UP AND GO:  Was the test performed? Yes .  Length of time to ambulate 10  feet: 5 sec.   Gait slow and steady with assistive device  Cognitive Function:    02/14/2023    8:51 AM  MMSE - Mini Mental State Exam  Orientation to time 5  Orientation to Place 5  Registration 3  Attention/ Calculation 3  Recall 1  Language- name 2 objects 2  Language- repeat 1  Language- follow 3 step command 3  Language- read & follow direction 1  Write a sentence 1  Copy design 1  Total score 26        03/08/2023    8:28 AM 02/14/2023    9:10 AM 02/17/2022   10:19 AM 06/01/2021    3:39 PM 02/16/2021    2:38 PM  6CIT Screen  What Year? 0 points 0 points 0 points 0 points 0 points  What month? 0 points 0 points 0 points 0 points 0 points  What time? 0 points 0 points 0 points 0 points 0 points  Count back from 20 0 points 0 points 0 points 0 points 0 points  Months in reverse 4 points 2 points 4 points 2 points 4 points  Repeat phrase 2 points 0 points 0 points 4 points 6 points  Total Score 6 points 2 points 4 points 6 points 10 points    Immunizations Immunization History  Administered Date(s) Administered   Influenza-Unspecified 08/29/2018, 08/16/2019, 08/21/2020, 08/15/2022   PFIZER(Purple Top)SARS-COV-2 Vaccination 09/27/2019, 10/25/2019, 07/07/2020   PPD Test 07/22/2021   Pneumococcal Conjugate-13 08/14/2019, 06/08/2021   Pneumococcal Polysaccharide-23 06/15/2022  Td 04/29/2015   Tdap 07/18/2021   Zoster Recombinat (Shingrix) 05/27/2018, 08/25/2018    TDAP status: Up to date  Flu Vaccine status: Up to date  Pneumococcal vaccine status: Up to date  Covid-19 vaccine status: Completed vaccines  Qualifies for Shingles Vaccine? Yes   Zostavax completed No   Shingrix Completed?: Yes  Screening Tests Health Maintenance  Topic Date Due   COVID-19 Vaccine (4 - 2023-24 season) 07/16/2022   INFLUENZA VACCINE  06/16/2023   Medicare Annual Wellness (AWV)  03/07/2024   DTaP/Tdap/Td (3 - Td or Tdap) 07/19/2031   Pneumonia Vaccine 18+ Years old  Completed    Zoster Vaccines- Shingrix  Completed   HPV VACCINES  Aged Out    Health Maintenance  Health Maintenance Due  Topic Date Due   COVID-19 Vaccine (4 - 2023-24 season) 07/16/2022    Colorectal cancer screening: No longer required.   Lung Cancer Screening: (Low Dose CT Chest recommended if Age 34-80 years, 30 pack-year currently smoking OR have quit w/in 15years.) does not qualify.   Additional Screening:  Hepatitis C Screening: does not qualify; Completed no  Vision Screening: Recommended annual ophthalmology exams for early detection of glaucoma and other disorders of the eye. Is the patient up to date with their annual eye exam?  Yes  Who is the provider or what is the name of the office in which the patient attends annual eye exams? Dr[facility]Bryan at [facility] If pt is not established with a provider, would they like to be referred to a provider to establish care? No .   Dental Screening: Recommended annual dental exams for proper oral hygiene  Community Resource Referral / Chronic Care Management: CRR required this visit?  No   CCM required this visit?  No      Plan:     I have personally reviewed and noted the following in the patient's chart:   Medical and social history Use of alcohol, tobacco or illicit drugs  Current medications and supplements including opioid prescriptions. Patient is not currently taking opioid prescriptions. Functional ability and status Nutritional status Physical activity Advanced directives List of other physicians Hospitalizations, surgeries, and ER visits in previous 12 months Vitals Screenings to include cognitive, depression, and falls Referrals and appointments  In addition, I have reviewed and discussed with patient certain preventive protocols, quality metrics, and best practice recommendations. A written personalized care plan for preventive services as well as general preventive health recommendations were provided to patient.      Hal Hope, LPN   1/61/0960   Nurse Notes: none

## 2023-03-08 NOTE — Telephone Encounter (Signed)
Spoke with patient's wife Carlos Richard to make her aware of Dr Henriette Combs recommendations. Carlos Richard verbalized understanding and says they will wait to their scheduled appointment with Dr Laural Benes on 03/15/23. Vicente Serene if they have any concerns before scheduled appointment to give our office a call.

## 2023-03-08 NOTE — Telephone Encounter (Signed)
Oxygen Levels and Heart Rate   Pt's wife is calling in because pt isn't feeling well. Says pt's oxygen levels are 94 and his heart rate is 77. Pt currently isn't experiencing any symptoms. Pt's wife wanted to schedule and appt for today, but there are no available appts. and wanted to speak with a nurse about pt's oxygen and heart rate.         Chief Complaint: Oxygen Level Symptoms: Wife calling, pt present, online as well. Wife concerned about pt's O2 sats. States at 96% on 2ls, HR74-77. States Sat at 90% without O2, Pt was out for appt, out to eat, getting in and out of the car, walking. States usually 94% on room air.  Denies any symptoms. Wife stated to agent pt "Not feeling well" Pt states he feels fine, "But this oxygen level isn't right."  Denies any associated symptoms. Frequency: Today Pertinent Negatives: Patient denies SOB, cough, weakness Disposition: ED /[] Urgent Care (no appt availability in office) / Appointment(In office/virtual)/  [place] [facility]/ Home Care/ Refused Recommended Disposition /[] [place] [facility]/  Follow-up with PCP Additional Notes: Wife anxious. Assured NT would route to practice for PCPs review. Advised to call back for any other concerns or if symptoms occur. Verbalizes understanding.    Reason for Disposition  Nursing judgment  Protocols used: No Guideline or Reference Available-A-AH

## 2023-03-09 DIAGNOSIS — Z9981 Dependence on supplemental oxygen: Secondary | ICD-10-CM | POA: Diagnosis not present

## 2023-03-09 DIAGNOSIS — E785 Hyperlipidemia, unspecified: Secondary | ICD-10-CM | POA: Diagnosis not present

## 2023-03-09 DIAGNOSIS — R55 Syncope and collapse: Secondary | ICD-10-CM | POA: Diagnosis not present

## 2023-03-09 DIAGNOSIS — N1831 Chronic kidney disease, stage 3a: Secondary | ICD-10-CM | POA: Diagnosis not present

## 2023-03-09 DIAGNOSIS — M109 Gout, unspecified: Secondary | ICD-10-CM | POA: Diagnosis not present

## 2023-03-09 DIAGNOSIS — J449 Chronic obstructive pulmonary disease, unspecified: Secondary | ICD-10-CM | POA: Diagnosis not present

## 2023-03-09 DIAGNOSIS — I129 Hypertensive chronic kidney disease with stage 1 through stage 4 chronic kidney disease, or unspecified chronic kidney disease: Secondary | ICD-10-CM | POA: Diagnosis not present

## 2023-03-09 DIAGNOSIS — I482 Chronic atrial fibrillation, unspecified: Secondary | ICD-10-CM | POA: Diagnosis not present

## 2023-03-09 DIAGNOSIS — R251 Tremor, unspecified: Secondary | ICD-10-CM | POA: Diagnosis not present

## 2023-03-09 DIAGNOSIS — Z7901 Long term (current) use of anticoagulants: Secondary | ICD-10-CM | POA: Diagnosis not present

## 2023-03-14 ENCOUNTER — Other Ambulatory Visit: Payer: Self-pay

## 2023-03-14 ENCOUNTER — Emergency Department
Admission: EM | Admit: 2023-03-14 | Discharge: 2023-03-14 | Discharge status: Against Medical Advice | Payer: Medicare Other | Attending: Student in an Organized Health Care Education/Training Program | Admitting: Student in an Organized Health Care Education/Training Program

## 2023-03-14 DIAGNOSIS — R55 Syncope and collapse: Secondary | ICD-10-CM | POA: Diagnosis not present

## 2023-03-14 DIAGNOSIS — Z9981 Dependence on supplemental oxygen: Secondary | ICD-10-CM | POA: Diagnosis not present

## 2023-03-14 DIAGNOSIS — Z7901 Long term (current) use of anticoagulants: Secondary | ICD-10-CM | POA: Diagnosis not present

## 2023-03-14 DIAGNOSIS — Z5321 Procedure and treatment not carried out due to patient leaving prior to being seen by health care provider: Secondary | ICD-10-CM | POA: Diagnosis not present

## 2023-03-14 DIAGNOSIS — N1831 Chronic kidney disease, stage 3a: Secondary | ICD-10-CM | POA: Diagnosis not present

## 2023-03-14 DIAGNOSIS — E785 Hyperlipidemia, unspecified: Secondary | ICD-10-CM | POA: Diagnosis not present

## 2023-03-14 DIAGNOSIS — R251 Tremor, unspecified: Secondary | ICD-10-CM | POA: Diagnosis not present

## 2023-03-14 DIAGNOSIS — I482 Chronic atrial fibrillation, unspecified: Secondary | ICD-10-CM | POA: Diagnosis not present

## 2023-03-14 DIAGNOSIS — R4182 Altered mental status, unspecified: Secondary | ICD-10-CM | POA: Diagnosis present

## 2023-03-14 DIAGNOSIS — J449 Chronic obstructive pulmonary disease, unspecified: Secondary | ICD-10-CM | POA: Diagnosis not present

## 2023-03-14 DIAGNOSIS — M109 Gout, unspecified: Secondary | ICD-10-CM | POA: Diagnosis not present

## 2023-03-14 DIAGNOSIS — I129 Hypertensive chronic kidney disease with stage 1 through stage 4 chronic kidney disease, or unspecified chronic kidney disease: Secondary | ICD-10-CM | POA: Diagnosis not present

## 2023-03-14 DIAGNOSIS — I10 Essential (primary) hypertension: Secondary | ICD-10-CM | POA: Diagnosis not present

## 2023-03-14 LAB — CBC
HCT: 43.6 % (ref 39.0–52.0)
Hemoglobin: 14.4 g/dL (ref 13.0–17.0)
MCH: 30.7 pg (ref 26.0–34.0)
MCHC: 33 g/dL (ref 30.0–36.0)
MCV: 93 fL (ref 80.0–100.0)
Platelets: 213 10*3/uL (ref 150–400)
RBC: 4.69 MIL/uL (ref 4.22–5.81)
RDW: 12.9 % (ref 11.5–15.5)
WBC: 9.7 10*3/uL (ref 4.0–10.5)
nRBC: 0 % (ref 0.0–0.2)

## 2023-03-14 LAB — URINALYSIS, ROUTINE W REFLEX MICROSCOPIC
Bilirubin Urine: NEGATIVE
Glucose, UA: NEGATIVE mg/dL
Hgb urine dipstick: NEGATIVE
Ketones, ur: NEGATIVE mg/dL
Leukocytes,Ua: NEGATIVE
Nitrite: NEGATIVE
Protein, ur: NEGATIVE mg/dL
Specific Gravity, Urine: 1.014 (ref 1.005–1.030)
pH: 6 (ref 5.0–8.0)

## 2023-03-14 LAB — COMPREHENSIVE METABOLIC PANEL
ALT: 21 U/L (ref 0–44)
AST: 30 U/L (ref 15–41)
Albumin: 3.8 g/dL (ref 3.5–5.0)
Alkaline Phosphatase: 71 U/L (ref 38–126)
Anion gap: 8 (ref 5–15)
BUN: 12 mg/dL (ref 8–23)
CO2: 26 mmol/L (ref 22–32)
Calcium: 8.7 mg/dL — ABNORMAL LOW (ref 8.9–10.3)
Chloride: 102 mmol/L (ref 98–111)
Creatinine, Ser: 1 mg/dL (ref 0.61–1.24)
GFR, Estimated: >60 mL/min (ref >60)
Glucose, Bld: 146 mg/dL — ABNORMAL HIGH (ref 70–99)
Potassium: 3.7 mmol/L (ref 3.5–5.1)
Sodium: 136 mmol/L (ref 135–145)
Total Bilirubin: 0.7 mg/dL (ref 0.3–1.2)
Total Protein: 6.9 g/dL (ref 6.5–8.1)

## 2023-03-14 NOTE — ED Triage Notes (Signed)
Pt to ED with wife for AMS started this am. Wife reports pt has wet his clothes 3 times today. Pt in NAD, denies pain

## 2023-03-15 ENCOUNTER — Ambulatory Visit: Payer: Medicare Other | Admitting: Family Medicine

## 2023-03-15 ENCOUNTER — Encounter: Payer: Self-pay | Admitting: Family Medicine

## 2023-03-15 VITALS — BP 160/77 | HR 63 | Temp 97.9°F | Ht 70.0 in | Wt 163.0 lb

## 2023-03-15 DIAGNOSIS — R410 Disorientation, unspecified: Secondary | ICD-10-CM

## 2023-03-15 NOTE — Patient Instructions (Addendum)
You have an appointment with [facility] on 04/06/23 at 10:30 AM with Dr. Malvin Johns.  Address: [address] [place], [address] Phone Number: [phone]

## 2023-03-15 NOTE — Progress Notes (Signed)
BP (!) 160/77 (BP Location: Left Arm, Cuff Size: Normal)   Pulse 63   Temp 97.9 F (36.6 C) (Oral)   Ht 5\' 10"  (1.778 m)   Wt 163 lb (73.9 kg)   SpO2 93%   BMI 23.39 kg/m    Subjective:    Patient ID: Carlos Richard, male    DOB: [birth year], 89 y.o.   MRN: [id number]  HPI: Carlos Richard is a 89 y.o. male  Chief Complaint  Patient presents with   Memory Changes    Patient wife says they went to the ED yesterday evening as the patient had a very rough day yesterday and was very disoriented. Patient wife says the patient got better as he sat during the wait at the ED, but says it just happened out of nowhere and it comes and goes.    Carlos Richard presents today with his wife. She notes that he has not been feeling like himself. He has been a bit disoriented. Has been a little nausous. His wife notes that he has been very confused. She notes that when she first noticed this it was yesterday and seemed to come out of the blue. He had not had anything else going on, hadn't been sick or anything. He did have a syncopal episode at the beginning of the month where he did hit his head, but had some issues where he was noticing some issues with his memory a bit before that. Yesterday, he notes that the confusion lasted most of the day. He was directed to the ER and went there, but was not seen. He notes that he was feeling better after sitting at the ER. Has had a tremor for a while and has an implantable device for it. He sees neurology, but has not seen them in a couple of years. No other concerns or complaints at this time.   Relevant past medical, surgical, family and social history reviewed and updated as indicated. Interim medical history since our last visit reviewed. Allergies and medications reviewed and updated.  Review of Systems  Constitutional: Negative.   Respiratory: Negative.    Cardiovascular: Negative.   Gastrointestinal: Negative.   Musculoskeletal: Negative.    Psychiatric/Behavioral:  Positive for confusion and decreased concentration. Negative for agitation, behavioral problems, dysphoric mood, hallucinations, self-injury, sleep disturbance and suicidal ideas. The patient is not nervous/anxious and is not hyperactive.     Per HPI unless specifically indicated above     Objective:    BP (!) 160/77 (BP Location: Left Arm, Cuff Size: Normal)   Pulse 63   Temp 97.9 F (36.6 C) (Oral)   Ht 5\' 10"  (1.778 m)   Wt 163 lb (73.9 kg)   SpO2 93%   BMI 23.39 kg/m   Wt Readings from Last 3 Encounters:  03/15/23 163 lb (73.9 kg)  03/08/23 163 lb 9.6 oz (74.2 kg)  03/03/23 166 lb 6.4 oz (75.5 kg)    Physical Exam Vitals and nursing note reviewed.  Constitutional:      General: He is not in acute distress.    Appearance: Normal appearance. He is normal weight. He is not ill-appearing, toxic-appearing or diaphoretic.  HENT:     Head: Normocephalic and atraumatic.     Right Ear: External ear normal.     Left Ear: External ear normal.     Nose: Nose normal.     Mouth/Throat:     Mouth: Mucous membranes are moist.     Pharynx: Oropharynx is  clear.  Eyes:     General: No scleral icterus.       Right eye: No discharge.        Left eye: No discharge.     Extraocular Movements: Extraocular movements intact.     Conjunctiva/sclera: Conjunctivae normal.     Pupils: Pupils are equal, round, and reactive to light.  Cardiovascular:     Rate and Rhythm: Normal rate and regular rhythm.     Pulses: Normal pulses.     Heart sounds: Normal heart sounds. No murmur heard.    No friction rub. No gallop.  Pulmonary:     Effort: Pulmonary effort is normal. No respiratory distress.     Breath sounds: Normal breath sounds. No stridor. No wheezing, rhonchi or rales.  Chest:     Chest wall: No tenderness.  Musculoskeletal:        General: Normal range of motion.     Cervical back: Normal range of motion and neck supple.  Skin:    General: Skin is warm and  dry.     Capillary Refill: Capillary refill takes less than 2 seconds.     Coloration: Skin is not jaundiced or pale.     Findings: No bruising, erythema, lesion or rash.  Neurological:     General: No focal deficit present.     Mental Status: He is alert and oriented to person, place, and time. Mental status is at baseline.  Psychiatric:        Mood and Affect: Mood normal.        Behavior: Behavior normal.        Thought Content: Thought content normal.        Judgment: Judgment normal.     Results for orders placed or performed in visit on 03/15/23  B12  Result Value Ref Range   Vitamin B-12 467 232 - 1,245 pg/mL  Folate  Result Value Ref Range   Folate >20.0 >3.0 ng/mL      Assessment & Plan:   Problem List Items Addressed This Visit   None Visit Diagnoses     Confusion    -  Primary   Will check labs today. ?post-concussive syndrome vs dementia. Will get him back into see his neurologist and check MRI. Call with any concerns.   Relevant Orders   MR Brain Wo Contrast   B12 (Completed)   Folate (Completed)        Follow up plan: Return As scheduled.

## 2023-03-16 ENCOUNTER — Ambulatory Visit: Payer: Self-pay | Admitting: *Deleted

## 2023-03-16 ENCOUNTER — Other Ambulatory Visit: Payer: Self-pay | Admitting: Family Medicine

## 2023-03-16 ENCOUNTER — Other Ambulatory Visit: Payer: Self-pay | Admitting: Cardiovascular Disease

## 2023-03-16 ENCOUNTER — Encounter: Payer: Self-pay | Admitting: Family Medicine

## 2023-03-16 DIAGNOSIS — E538 Deficiency of other specified B group vitamins: Secondary | ICD-10-CM | POA: Insufficient documentation

## 2023-03-16 LAB — FOLATE: Folate: >20 ng/mL (ref >3.0)

## 2023-03-16 LAB — VITAMIN B12: Vitamin B-12: 467 pg/mL (ref 232–1245)

## 2023-03-16 MED ORDER — CYANOCOBALAMIN 1000 MCG/ML IJ SOLN
1000.0000 ug | INTRAMUSCULAR | Status: AC
Start: 2023-03-16 — End: 2023-04-12
  Administered 2023-03-21 – 2023-04-12 (×4): 1000 ug via INTRAMUSCULAR

## 2023-03-16 MED ORDER — CYANOCOBALAMIN 1000 MCG/ML IJ SOLN
1000.0000 ug | INTRAMUSCULAR | Status: AC
Start: 2023-04-16 — End: 2024-04-09
  Administered 2023-05-13 – 2023-12-01 (×5): 1000 ug via INTRAMUSCULAR

## 2023-03-16 NOTE — Telephone Encounter (Signed)
  Chief Complaint: Wife wants to know when he will be able to consume alcohol again.  Was seen for confusion/concussion on by Dr. Laural Benes on 03/15/2023. Symptoms: Wife was insistent that she needs to know as soon as possible if he can have alcohol. Frequency: N/A Pertinent Negatives: Patient denies N/A Disposition: [] ED /[] Urgent Care (no appt availability in office) / [] Appointment(In office/virtual)/ []  [place] [facility]/ [] Home Care/ [] Refused Recommended Disposition /[] [place] [facility]/ [x]  Follow-up with PCP Additional Notes: I sent message to Dr. Laural Benes.   Carney Bern agreeable to being called back today.    "I need to know today as soon as possible".

## 2023-03-16 NOTE — Telephone Encounter (Signed)
Message from [place] sent at 03/16/2023 12:17 PM EDT  Summary: alcohol concern   The patient would like to know when they'll be able to consume alcohol again, as permitted by their PCP  The patient has been previously seen 03/15/23 for concussion concerns  The patient is no longer feeling any headache or discomfort  Please contact further when possible          Call History   Type Contact Phone/Fax User  03/16/2023 12:15 PM EDT Phone (Incoming) Cindra Eves (Emergency Contact) [phone] Gaetana Michaelis A   Reason for Disposition  [1] Caller requesting NON-URGENT health information AND [2] PCP's office is the best resource  Answer Assessment - Initial Assessment Questions 1. REASON FOR CALL or QUESTION: "What is your reason for calling today?" or "How can I best help you?" or "What question do you have that I can help answer?"     Spoke with wife Constance Hackenberg.   She is wanting to know as soon as possible when he can have alcohol again.   "I want this message sent to Dr. Laural Benes and I want to know from her if he can have alcohol".    I asked if he was having withdrawal symptoms or something else since she was insisting that Dr. Laural Benes call her today as soon as possible.    "I have to know something today".   She said, "No he doesn't drink enough to go into withdrawals".   We like to go out and we might have 1-2 beers a week and I just need to know if he can have alcohol.   She was insistent in her request when I told her I would send the message to Dr. Laural Benes.    "I have to know something today as soon as possible".  Protocols used: Information Only Call - No Triage-A-AH

## 2023-03-17 DIAGNOSIS — Z7901 Long term (current) use of anticoagulants: Secondary | ICD-10-CM | POA: Diagnosis not present

## 2023-03-17 DIAGNOSIS — E785 Hyperlipidemia, unspecified: Secondary | ICD-10-CM | POA: Diagnosis not present

## 2023-03-17 DIAGNOSIS — M109 Gout, unspecified: Secondary | ICD-10-CM | POA: Diagnosis not present

## 2023-03-17 DIAGNOSIS — I129 Hypertensive chronic kidney disease with stage 1 through stage 4 chronic kidney disease, or unspecified chronic kidney disease: Secondary | ICD-10-CM | POA: Diagnosis not present

## 2023-03-17 DIAGNOSIS — R251 Tremor, unspecified: Secondary | ICD-10-CM | POA: Diagnosis not present

## 2023-03-17 DIAGNOSIS — R55 Syncope and collapse: Secondary | ICD-10-CM | POA: Diagnosis not present

## 2023-03-17 DIAGNOSIS — Z9981 Dependence on supplemental oxygen: Secondary | ICD-10-CM | POA: Diagnosis not present

## 2023-03-17 DIAGNOSIS — I482 Chronic atrial fibrillation, unspecified: Secondary | ICD-10-CM | POA: Diagnosis not present

## 2023-03-17 DIAGNOSIS — N1831 Chronic kidney disease, stage 3a: Secondary | ICD-10-CM | POA: Diagnosis not present

## 2023-03-17 DIAGNOSIS — J449 Chronic obstructive pulmonary disease, unspecified: Secondary | ICD-10-CM | POA: Diagnosis not present

## 2023-03-18 ENCOUNTER — Ambulatory Visit (INDEPENDENT_AMBULATORY_CARE_PROVIDER_SITE_OTHER): Payer: Medicare Other

## 2023-03-18 ENCOUNTER — Encounter: Payer: Self-pay | Admitting: Family Medicine

## 2023-03-18 DIAGNOSIS — I351 Nonrheumatic aortic (valve) insufficiency: Secondary | ICD-10-CM | POA: Diagnosis not present

## 2023-03-18 DIAGNOSIS — I340 Nonrheumatic mitral (valve) insufficiency: Secondary | ICD-10-CM | POA: Diagnosis not present

## 2023-03-18 DIAGNOSIS — I361 Nonrheumatic tricuspid (valve) insufficiency: Secondary | ICD-10-CM

## 2023-03-18 DIAGNOSIS — I4891 Unspecified atrial fibrillation: Secondary | ICD-10-CM

## 2023-03-18 DIAGNOSIS — E782 Mixed hyperlipidemia: Secondary | ICD-10-CM

## 2023-03-18 DIAGNOSIS — N1831 Chronic kidney disease, stage 3a: Secondary | ICD-10-CM

## 2023-03-18 DIAGNOSIS — R55 Syncope and collapse: Secondary | ICD-10-CM

## 2023-03-18 DIAGNOSIS — I10 Essential (primary) hypertension: Secondary | ICD-10-CM

## 2023-03-18 NOTE — Telephone Encounter (Signed)
Patient wife Carlos Richard was made aware of Dr Henriette Combs recommendations. Carlos Richard verbalized understanding and has no further questions at this time.

## 2023-03-18 NOTE — Telephone Encounter (Signed)
I would advise him to avoid alcohol until he is feeling more like himself

## 2023-03-21 ENCOUNTER — Telehealth: Payer: Self-pay | Admitting: Family Medicine

## 2023-03-21 ENCOUNTER — Ambulatory Visit (INDEPENDENT_AMBULATORY_CARE_PROVIDER_SITE_OTHER): Payer: Medicare Other

## 2023-03-21 DIAGNOSIS — R55 Syncope and collapse: Secondary | ICD-10-CM | POA: Diagnosis not present

## 2023-03-21 DIAGNOSIS — E538 Deficiency of other specified B group vitamins: Secondary | ICD-10-CM

## 2023-03-21 DIAGNOSIS — I482 Chronic atrial fibrillation, unspecified: Secondary | ICD-10-CM | POA: Diagnosis not present

## 2023-03-21 DIAGNOSIS — N1831 Chronic kidney disease, stage 3a: Secondary | ICD-10-CM | POA: Diagnosis not present

## 2023-03-21 DIAGNOSIS — M109 Gout, unspecified: Secondary | ICD-10-CM | POA: Diagnosis not present

## 2023-03-21 DIAGNOSIS — Z7901 Long term (current) use of anticoagulants: Secondary | ICD-10-CM | POA: Diagnosis not present

## 2023-03-21 DIAGNOSIS — E785 Hyperlipidemia, unspecified: Secondary | ICD-10-CM | POA: Diagnosis not present

## 2023-03-21 DIAGNOSIS — Z9981 Dependence on supplemental oxygen: Secondary | ICD-10-CM | POA: Diagnosis not present

## 2023-03-21 DIAGNOSIS — J449 Chronic obstructive pulmonary disease, unspecified: Secondary | ICD-10-CM | POA: Diagnosis not present

## 2023-03-21 DIAGNOSIS — R251 Tremor, unspecified: Secondary | ICD-10-CM | POA: Diagnosis not present

## 2023-03-21 DIAGNOSIS — I129 Hypertensive chronic kidney disease with stage 1 through stage 4 chronic kidney disease, or unspecified chronic kidney disease: Secondary | ICD-10-CM | POA: Diagnosis not present

## 2023-03-21 NOTE — Telephone Encounter (Signed)
He is supposed to wear his oxygen continuously. OK for verbal orders

## 2023-03-21 NOTE — Progress Notes (Signed)
Patient presents today for B-12 injection patient received in Left deltoid, patient tolerated well.

## 2023-03-21 NOTE — Telephone Encounter (Signed)
Home Health Verbal Orders - Caller/Agency: Irving Burton with [facility]  Callback Number: [phone]  Requesting OT/PT/Skilled Nursing/Social Work/Speech Therapy: PT  Frequency: extend adding a visit this week  2 w 2 and 1 w 1   Needs clarification on when he is to wear his oxygen.   Stas 95% rest and 84% active

## 2023-03-21 NOTE — Telephone Encounter (Signed)
Spoke with Irving Burton from [facility] to provided verbal OK orders per Dr Laural Benes. Irving Burton  verbalized understanding.

## 2023-03-23 ENCOUNTER — Telehealth: Payer: Self-pay | Admitting: Family Medicine

## 2023-03-23 DIAGNOSIS — J449 Chronic obstructive pulmonary disease, unspecified: Secondary | ICD-10-CM | POA: Diagnosis not present

## 2023-03-23 NOTE — Telephone Encounter (Signed)
Thanks so much [place]!

## 2023-03-23 NOTE — Telephone Encounter (Signed)
Per radiology MRI needs to be done at [facility] as they don't have record or documentation of the leads with make and model #.  Can we please see about getting this scheduled at [facility] please?

## 2023-03-24 DIAGNOSIS — R55 Syncope and collapse: Secondary | ICD-10-CM | POA: Diagnosis not present

## 2023-03-24 DIAGNOSIS — I129 Hypertensive chronic kidney disease with stage 1 through stage 4 chronic kidney disease, or unspecified chronic kidney disease: Secondary | ICD-10-CM | POA: Diagnosis not present

## 2023-03-24 DIAGNOSIS — M109 Gout, unspecified: Secondary | ICD-10-CM | POA: Diagnosis not present

## 2023-03-24 DIAGNOSIS — Z9981 Dependence on supplemental oxygen: Secondary | ICD-10-CM | POA: Diagnosis not present

## 2023-03-24 DIAGNOSIS — Z7901 Long term (current) use of anticoagulants: Secondary | ICD-10-CM | POA: Diagnosis not present

## 2023-03-24 DIAGNOSIS — R251 Tremor, unspecified: Secondary | ICD-10-CM | POA: Diagnosis not present

## 2023-03-24 DIAGNOSIS — I482 Chronic atrial fibrillation, unspecified: Secondary | ICD-10-CM | POA: Diagnosis not present

## 2023-03-24 DIAGNOSIS — J449 Chronic obstructive pulmonary disease, unspecified: Secondary | ICD-10-CM | POA: Diagnosis not present

## 2023-03-24 DIAGNOSIS — N1831 Chronic kidney disease, stage 3a: Secondary | ICD-10-CM | POA: Diagnosis not present

## 2023-03-24 DIAGNOSIS — E785 Hyperlipidemia, unspecified: Secondary | ICD-10-CM | POA: Diagnosis not present

## 2023-03-28 ENCOUNTER — Ambulatory Visit (INDEPENDENT_AMBULATORY_CARE_PROVIDER_SITE_OTHER): Payer: Medicare Other

## 2023-03-28 DIAGNOSIS — R251 Tremor, unspecified: Secondary | ICD-10-CM | POA: Diagnosis not present

## 2023-03-28 DIAGNOSIS — I129 Hypertensive chronic kidney disease with stage 1 through stage 4 chronic kidney disease, or unspecified chronic kidney disease: Secondary | ICD-10-CM | POA: Diagnosis not present

## 2023-03-28 DIAGNOSIS — E785 Hyperlipidemia, unspecified: Secondary | ICD-10-CM | POA: Diagnosis not present

## 2023-03-28 DIAGNOSIS — N1831 Chronic kidney disease, stage 3a: Secondary | ICD-10-CM | POA: Diagnosis not present

## 2023-03-28 DIAGNOSIS — Z9981 Dependence on supplemental oxygen: Secondary | ICD-10-CM | POA: Diagnosis not present

## 2023-03-28 DIAGNOSIS — I482 Chronic atrial fibrillation, unspecified: Secondary | ICD-10-CM | POA: Diagnosis not present

## 2023-03-28 DIAGNOSIS — E538 Deficiency of other specified B group vitamins: Secondary | ICD-10-CM

## 2023-03-28 DIAGNOSIS — R55 Syncope and collapse: Secondary | ICD-10-CM | POA: Diagnosis not present

## 2023-03-28 DIAGNOSIS — M109 Gout, unspecified: Secondary | ICD-10-CM | POA: Diagnosis not present

## 2023-03-28 DIAGNOSIS — J449 Chronic obstructive pulmonary disease, unspecified: Secondary | ICD-10-CM | POA: Diagnosis not present

## 2023-03-28 DIAGNOSIS — Z7901 Long term (current) use of anticoagulants: Secondary | ICD-10-CM | POA: Diagnosis not present

## 2023-03-29 DIAGNOSIS — J449 Chronic obstructive pulmonary disease, unspecified: Secondary | ICD-10-CM | POA: Diagnosis not present

## 2023-03-29 DIAGNOSIS — R251 Tremor, unspecified: Secondary | ICD-10-CM | POA: Diagnosis not present

## 2023-03-29 DIAGNOSIS — I482 Chronic atrial fibrillation, unspecified: Secondary | ICD-10-CM | POA: Diagnosis not present

## 2023-03-29 DIAGNOSIS — M109 Gout, unspecified: Secondary | ICD-10-CM | POA: Diagnosis not present

## 2023-03-29 DIAGNOSIS — Z9981 Dependence on supplemental oxygen: Secondary | ICD-10-CM | POA: Diagnosis not present

## 2023-03-29 DIAGNOSIS — R55 Syncope and collapse: Secondary | ICD-10-CM | POA: Diagnosis not present

## 2023-03-29 DIAGNOSIS — Z7901 Long term (current) use of anticoagulants: Secondary | ICD-10-CM | POA: Diagnosis not present

## 2023-03-29 DIAGNOSIS — N1831 Chronic kidney disease, stage 3a: Secondary | ICD-10-CM | POA: Diagnosis not present

## 2023-03-29 DIAGNOSIS — I129 Hypertensive chronic kidney disease with stage 1 through stage 4 chronic kidney disease, or unspecified chronic kidney disease: Secondary | ICD-10-CM | POA: Diagnosis not present

## 2023-03-29 DIAGNOSIS — E785 Hyperlipidemia, unspecified: Secondary | ICD-10-CM | POA: Diagnosis not present

## 2023-04-04 ENCOUNTER — Ambulatory Visit: Payer: Medicare Other

## 2023-04-04 ENCOUNTER — Ambulatory Visit (INDEPENDENT_AMBULATORY_CARE_PROVIDER_SITE_OTHER): Payer: Medicare Other

## 2023-04-04 DIAGNOSIS — E538 Deficiency of other specified B group vitamins: Secondary | ICD-10-CM

## 2023-04-05 ENCOUNTER — Ambulatory Visit: Payer: Medicare Other | Admitting: Cardiovascular Disease

## 2023-04-05 ENCOUNTER — Encounter: Payer: Self-pay | Admitting: Cardiovascular Disease

## 2023-04-05 VITALS — BP 120/80 | HR 59 | Ht 69.0 in | Wt 164.0 lb

## 2023-04-05 DIAGNOSIS — I4891 Unspecified atrial fibrillation: Secondary | ICD-10-CM | POA: Diagnosis not present

## 2023-04-05 DIAGNOSIS — M109 Gout, unspecified: Secondary | ICD-10-CM | POA: Diagnosis not present

## 2023-04-05 DIAGNOSIS — N1831 Chronic kidney disease, stage 3a: Secondary | ICD-10-CM

## 2023-04-05 DIAGNOSIS — I10 Essential (primary) hypertension: Secondary | ICD-10-CM

## 2023-04-05 DIAGNOSIS — I340 Nonrheumatic mitral (valve) insufficiency: Secondary | ICD-10-CM

## 2023-04-05 DIAGNOSIS — I351 Nonrheumatic aortic (valve) insufficiency: Secondary | ICD-10-CM

## 2023-04-05 DIAGNOSIS — Z9981 Dependence on supplemental oxygen: Secondary | ICD-10-CM | POA: Diagnosis not present

## 2023-04-05 DIAGNOSIS — E785 Hyperlipidemia, unspecified: Secondary | ICD-10-CM | POA: Diagnosis not present

## 2023-04-05 DIAGNOSIS — I129 Hypertensive chronic kidney disease with stage 1 through stage 4 chronic kidney disease, or unspecified chronic kidney disease: Secondary | ICD-10-CM | POA: Diagnosis not present

## 2023-04-05 DIAGNOSIS — Z7901 Long term (current) use of anticoagulants: Secondary | ICD-10-CM | POA: Diagnosis not present

## 2023-04-05 DIAGNOSIS — R251 Tremor, unspecified: Secondary | ICD-10-CM | POA: Diagnosis not present

## 2023-04-05 DIAGNOSIS — J449 Chronic obstructive pulmonary disease, unspecified: Secondary | ICD-10-CM | POA: Diagnosis not present

## 2023-04-05 DIAGNOSIS — E782 Mixed hyperlipidemia: Secondary | ICD-10-CM

## 2023-04-05 DIAGNOSIS — R55 Syncope and collapse: Secondary | ICD-10-CM

## 2023-04-05 DIAGNOSIS — I482 Chronic atrial fibrillation, unspecified: Secondary | ICD-10-CM | POA: Diagnosis not present

## 2023-04-05 NOTE — Assessment & Plan Note (Signed)
stable °

## 2023-04-05 NOTE — Progress Notes (Signed)
Cardiology Office Note   Date:  04/05/2023   ID:  Carlos Richard, DOB 07-Mar-1932, MRN [id number]  PCP:  Dorcas Carrow, DO  Cardiologist:  Adrian Blackwater, MD      History of Present Illness: Carlos Richard is a 89 y.o. male who presents for  Chief Complaint  Patient presents with   Follow-up    1 month follow up, Echo results.    Doing well      Past Medical History:  Diagnosis Date   Atrial fibrillation (HCC)    COPD (chronic obstructive pulmonary disease) (HCC)    Hypertension    Near syncope    Tremors of nervous system      Past Surgical History:  Procedure Laterality Date   BACK SURGERY     BRAIN SURGERY     stimulator placement for tremors   EYE SURGERY Left 2020   cataract surgery nov    EYE SURGERY Right 2020   cataract surgery dec     Current Outpatient Medications  Medication Sig Dispense Refill   allopurinol (ZYLOPRIM) 100 MG tablet Take 1 tablet (100 mg total) by mouth daily. Monday, Wednesday, Friday 90 tablet 1   fluticasone-salmeterol (ADVAIR DISKUS) 250-50 MCG/ACT AEPB INHALE 1 PUFF INTO THE LUNGS TWICE A DAY 180 each 1   furosemide (LASIX) 20 MG tablet TAKE 1 TABLET DAILY. (Patient taking differently: Take 20 mg by mouth daily. Monday, Wednesday, Friday) 90 tablet 0   losartan (COZAAR) 25 MG tablet once a day 90 tablet 0   magnesium oxide (MAG-OX) 400 MG tablet Take 400 mg by mouth daily.     metoprolol succinate (TOPROL-XL) 50 MG 24 hr tablet TAKE 1 TABLET DAILY. 90 tablet 0   Multiple Vitamins-Minerals (ZINC PO) Take 1 tablet by mouth daily.     simvastatin (ZOCOR) 20 MG tablet Take 1 tablet (20 mg total) by mouth daily. 90 tablet 1   XARELTO 15 MG TABS tablet Take 1 tablet (15 mg total) by mouth daily with supper. 90 tablet 3   Zinc 50 MG TABS Take 50 mg by mouth daily.      Current Facility-Administered Medications  Medication Dose Route Frequency Provider Last Rate Last Admin   [START ON 04/16/2023] cyanocobalamin (VITAMIN B12)  injection 1,000 mcg  1,000 mcg Intramuscular Q30 days Johnson, Megan P, DO       cyanocobalamin (VITAMIN B12) injection 1,000 mcg  1,000 mcg Intramuscular Weekly Johnson, Megan P, DO   1,000 mcg at 04/04/23 1332    Allergies:   Patient has no known allergies.    Social History:   reports that he quit smoking about 44 years ago. His smoking use included cigarettes. He has never used smokeless tobacco. He reports current alcohol use. He reports that he does not use drugs.   Family History:  family history includes Obesity in his sister; Tremor in his paternal grandmother; Tuberculosis in his mother.    ROS:     Review of Systems  Constitutional: Negative.   HENT: Negative.    Eyes: Negative.   Respiratory: Negative.    Gastrointestinal: Negative.   Genitourinary: Negative.   Musculoskeletal: Negative.   Skin: Negative.   Neurological: Negative.   Endo/Heme/Allergies: Negative.   Psychiatric/Behavioral: Negative.    All other systems reviewed and are negative.     All other systems are reviewed and negative.    PHYSICAL EXAM: VS:  BP 120/80   Pulse (!) 59   Ht 5\' 9"  (  1.753 m)   Wt 164 lb (74.4 kg)   SpO2 92%   BMI 24.22 kg/m  , BMI Body mass index is 24.22 kg/m. Last weight:  Wt Readings from Last 3 Encounters:  04/05/23 164 lb (74.4 kg)  03/15/23 163 lb (73.9 kg)  03/08/23 163 lb 9.6 oz (74.2 kg)     Physical Exam Vitals reviewed.  Constitutional:      Appearance: Normal appearance. He is normal weight.  HENT:     Head: Normocephalic.     Nose: Nose normal.     Mouth/Throat:     Mouth: Mucous membranes are moist.  Eyes:     Pupils: Pupils are equal, round, and reactive to light.  Cardiovascular:     Rate and Rhythm: Normal rate and regular rhythm.     Pulses: Normal pulses.     Heart sounds: Normal heart sounds.  Pulmonary:     Effort: Pulmonary effort is normal.  Abdominal:     General: Abdomen is flat. Bowel sounds are normal.  Musculoskeletal:         General: Normal range of motion.     Cervical back: Normal range of motion.  Skin:    General: Skin is warm.  Neurological:     General: No focal deficit present.     Mental Status: He is alert.  Psychiatric:        Mood and Affect: Mood normal.       EKG:   Recent Labs: 03/03/2023: TSH 1.280 03/14/2023: ALT 21; BUN 12; Creatinine, Ser 1.00; Hemoglobin 14.4; Platelets 213; Potassium 3.7; Sodium 136    Lipid Panel    Component Value Date/Time   CHOL 144 12/14/2022 1454   TRIG 162 (H) 12/14/2022 1454   HDL 39 (L) 12/14/2022 1454   LDLCALC 77 12/14/2022 1454      Other studies Reviewed: Additional studies/ records that were reviewed today include:  Review of the above records demonstrates:       No data to display            ASSESSMENT AND PLAN:    ICD-10-CM   1. Atrial fibrillation, unspecified type (HCC)  I48.91     2. Primary hypertension  I10     3. Chronic kidney disease, stage 3a (HCC)  N18.31     4. Mixed hyperlipidemia  E78.2     5. Syncope, unspecified syncope type  R55     6. Nonrheumatic mitral valve regurgitation  I34.0    mild MR    7. Nonrheumatic aortic valve insufficiency  I35.1    moderate to severe with normal EF and asymptomatic       Problem List Items Addressed This Visit       Cardiovascular and Mediastinum   Atrial fibrillation (HCC) - Primary   HTN (hypertension)    stable        Genitourinary   Chronic kidney disease, stage 3a (HCC)     Other   Mixed hyperlipidemia   Syncope   Other Visit Diagnoses     Nonrheumatic mitral valve regurgitation       mild MR   Nonrheumatic aortic valve insufficiency       moderate to severe with normal EF and asymptomatic          Disposition:   Return in about 3 months (around 07/06/2023).    Total time spent: 30 minutes  Signed,  Adrian Blackwater, MD  04/05/2023 9:27 AM    [facility]

## 2023-04-06 DIAGNOSIS — R42 Dizziness and giddiness: Secondary | ICD-10-CM | POA: Diagnosis not present

## 2023-04-06 DIAGNOSIS — R55 Syncope and collapse: Secondary | ICD-10-CM | POA: Diagnosis not present

## 2023-04-07 DIAGNOSIS — M109 Gout, unspecified: Secondary | ICD-10-CM | POA: Diagnosis not present

## 2023-04-07 DIAGNOSIS — R55 Syncope and collapse: Secondary | ICD-10-CM | POA: Diagnosis not present

## 2023-04-07 DIAGNOSIS — I482 Chronic atrial fibrillation, unspecified: Secondary | ICD-10-CM | POA: Diagnosis not present

## 2023-04-07 DIAGNOSIS — J449 Chronic obstructive pulmonary disease, unspecified: Secondary | ICD-10-CM | POA: Diagnosis not present

## 2023-04-07 DIAGNOSIS — N1831 Chronic kidney disease, stage 3a: Secondary | ICD-10-CM | POA: Diagnosis not present

## 2023-04-07 DIAGNOSIS — Z9981 Dependence on supplemental oxygen: Secondary | ICD-10-CM | POA: Diagnosis not present

## 2023-04-07 DIAGNOSIS — Z7901 Long term (current) use of anticoagulants: Secondary | ICD-10-CM | POA: Diagnosis not present

## 2023-04-07 DIAGNOSIS — R251 Tremor, unspecified: Secondary | ICD-10-CM | POA: Diagnosis not present

## 2023-04-07 DIAGNOSIS — E785 Hyperlipidemia, unspecified: Secondary | ICD-10-CM | POA: Diagnosis not present

## 2023-04-07 DIAGNOSIS — I129 Hypertensive chronic kidney disease with stage 1 through stage 4 chronic kidney disease, or unspecified chronic kidney disease: Secondary | ICD-10-CM | POA: Diagnosis not present

## 2023-04-12 ENCOUNTER — Ambulatory Visit: Payer: Medicare Other

## 2023-04-12 ENCOUNTER — Ambulatory Visit: Payer: Self-pay

## 2023-04-12 ENCOUNTER — Ambulatory Visit (INDEPENDENT_AMBULATORY_CARE_PROVIDER_SITE_OTHER): Payer: Medicare Other

## 2023-04-12 DIAGNOSIS — I129 Hypertensive chronic kidney disease with stage 1 through stage 4 chronic kidney disease, or unspecified chronic kidney disease: Secondary | ICD-10-CM | POA: Diagnosis not present

## 2023-04-12 DIAGNOSIS — J449 Chronic obstructive pulmonary disease, unspecified: Secondary | ICD-10-CM | POA: Diagnosis not present

## 2023-04-12 DIAGNOSIS — R55 Syncope and collapse: Secondary | ICD-10-CM | POA: Diagnosis not present

## 2023-04-12 DIAGNOSIS — M109 Gout, unspecified: Secondary | ICD-10-CM | POA: Diagnosis not present

## 2023-04-12 DIAGNOSIS — Z9981 Dependence on supplemental oxygen: Secondary | ICD-10-CM | POA: Diagnosis not present

## 2023-04-12 DIAGNOSIS — Z7901 Long term (current) use of anticoagulants: Secondary | ICD-10-CM | POA: Diagnosis not present

## 2023-04-12 DIAGNOSIS — I482 Chronic atrial fibrillation, unspecified: Secondary | ICD-10-CM | POA: Diagnosis not present

## 2023-04-12 DIAGNOSIS — N1831 Chronic kidney disease, stage 3a: Secondary | ICD-10-CM | POA: Diagnosis not present

## 2023-04-12 DIAGNOSIS — R251 Tremor, unspecified: Secondary | ICD-10-CM | POA: Diagnosis not present

## 2023-04-12 DIAGNOSIS — E785 Hyperlipidemia, unspecified: Secondary | ICD-10-CM | POA: Diagnosis not present

## 2023-04-12 DIAGNOSIS — E538 Deficiency of other specified B group vitamins: Secondary | ICD-10-CM

## 2023-04-12 NOTE — Telephone Encounter (Signed)
Please remind him to use his oxygen. Thanks!

## 2023-04-12 NOTE — Telephone Encounter (Signed)
Chief Complaint: Calling to report a fall Disposition: [] ED /[] Urgent Care (no appt availability in office) / [] Appointment(In office/virtual)/ []  [facility]/ [] Home Care/ [] Refused Recommended Disposition /[] [facility]/ [x]  Follow-up with PCP Additional Notes: Elmer Bales, PT with [facility] called to say today was the patient's last PT visit. She says the wife told her he was out in the yard yesterday picking up pine needle/cones and fell outside, lost balance. She says did not sustain any injuries, no bruising that she could see, and no reports of pain. She also wants Dr. Laural Benes to know that the patient is not using the oxygen that was ordered to use. She says he is at his baseline, resting oxygen levels are 93%, walking 200 ft levels drop to 80's. Today he walked 300 feet and oxygen levels drop to 83%. Advised I will get this information to Dr. Laural Benes for review. Patient called to discuss fall, left VM to return the call to the office.   Reason for Disposition  General information question, no triage required and triager able to answer question  Answer Assessment - Initial Assessment Questions 1. REASON FOR CALL or QUESTION: "What is your reason for calling today?" or "How can I best help you?" or "What question do you have that I can help answer?"     Give information to provider about fall yesterday  Protocols used: Information Only Call - No Triage-A-AH

## 2023-04-12 NOTE — Telephone Encounter (Signed)
Patient wife Carney Bern was notified and made aware of Dr Henriette Combs recommendations. Carney Bern verbalized understanding and has no further questions at this time.

## 2023-04-23 DIAGNOSIS — J449 Chronic obstructive pulmonary disease, unspecified: Secondary | ICD-10-CM | POA: Diagnosis not present

## 2023-04-24 DIAGNOSIS — J449 Chronic obstructive pulmonary disease, unspecified: Secondary | ICD-10-CM | POA: Diagnosis not present

## 2023-05-09 DIAGNOSIS — R410 Disorientation, unspecified: Secondary | ICD-10-CM | POA: Diagnosis not present

## 2023-05-09 DIAGNOSIS — R6889 Other general symptoms and signs: Secondary | ICD-10-CM | POA: Diagnosis not present

## 2023-05-09 DIAGNOSIS — R0902 Hypoxemia: Secondary | ICD-10-CM | POA: Diagnosis not present

## 2023-05-09 DIAGNOSIS — Z743 Need for continuous supervision: Secondary | ICD-10-CM | POA: Diagnosis not present

## 2023-05-09 DIAGNOSIS — R531 Weakness: Secondary | ICD-10-CM | POA: Diagnosis not present

## 2023-05-10 ENCOUNTER — Observation Stay
Admission: EM | Admit: 2023-05-10 | Discharge: 2023-05-11 | Disposition: A | Discharge status: Home / Self Care | Payer: Medicare Other | Attending: Internal Medicine | Admitting: Internal Medicine

## 2023-05-10 ENCOUNTER — Emergency Department: Payer: Medicare Other

## 2023-05-10 ENCOUNTER — Observation Stay: Payer: Medicare Other

## 2023-05-10 ENCOUNTER — Other Ambulatory Visit: Payer: Self-pay

## 2023-05-10 DIAGNOSIS — J449 Chronic obstructive pulmonary disease, unspecified: Secondary | ICD-10-CM | POA: Diagnosis not present

## 2023-05-10 DIAGNOSIS — R918 Other nonspecific abnormal finding of lung field: Secondary | ICD-10-CM | POA: Diagnosis not present

## 2023-05-10 DIAGNOSIS — I700 Atherosclerosis of aorta: Secondary | ICD-10-CM | POA: Diagnosis not present

## 2023-05-10 DIAGNOSIS — Z9689 Presence of other specified functional implants: Secondary | ICD-10-CM

## 2023-05-10 DIAGNOSIS — Z87891 Personal history of nicotine dependence: Secondary | ICD-10-CM | POA: Diagnosis not present

## 2023-05-10 DIAGNOSIS — R4182 Altered mental status, unspecified: Secondary | ICD-10-CM | POA: Diagnosis present

## 2023-05-10 DIAGNOSIS — Z79899 Other long term (current) drug therapy: Secondary | ICD-10-CM | POA: Insufficient documentation

## 2023-05-10 DIAGNOSIS — R410 Disorientation, unspecified: Secondary | ICD-10-CM | POA: Diagnosis not present

## 2023-05-10 DIAGNOSIS — I482 Chronic atrial fibrillation, unspecified: Secondary | ICD-10-CM | POA: Insufficient documentation

## 2023-05-10 DIAGNOSIS — G3184 Mild cognitive impairment, so stated: Secondary | ICD-10-CM | POA: Diagnosis present

## 2023-05-10 DIAGNOSIS — J189 Pneumonia, unspecified organism: Secondary | ICD-10-CM | POA: Diagnosis not present

## 2023-05-10 DIAGNOSIS — Z87898 Personal history of other specified conditions: Secondary | ICD-10-CM

## 2023-05-10 DIAGNOSIS — R9389 Abnormal findings on diagnostic imaging of other specified body structures: Secondary | ICD-10-CM

## 2023-05-10 DIAGNOSIS — I10 Essential (primary) hypertension: Secondary | ICD-10-CM | POA: Diagnosis not present

## 2023-05-10 DIAGNOSIS — Z7901 Long term (current) use of anticoagulants: Secondary | ICD-10-CM | POA: Diagnosis not present

## 2023-05-10 DIAGNOSIS — I6523 Occlusion and stenosis of bilateral carotid arteries: Secondary | ICD-10-CM | POA: Diagnosis not present

## 2023-05-10 DIAGNOSIS — I4891 Unspecified atrial fibrillation: Secondary | ICD-10-CM | POA: Diagnosis present

## 2023-05-10 DIAGNOSIS — I672 Cerebral atherosclerosis: Secondary | ICD-10-CM | POA: Diagnosis not present

## 2023-05-10 LAB — CBG MONITORING, ED: Glucose-Capillary: 117 mg/dL — ABNORMAL HIGH (ref 70–99)

## 2023-05-10 LAB — BLOOD GAS, VENOUS
Acid-base deficit: 1.1 mmol/L (ref 0.0–2.0)
Bicarbonate: 23.5 mmol/L (ref 20.0–28.0)
O2 Saturation: 72.4 %
Patient temperature: 37
pCO2, Ven: 38 mmHg — ABNORMAL LOW (ref 44–60)
pH, Ven: 7.4 (ref 7.25–7.43)
pO2, Ven: 41 mmHg (ref 32–45)

## 2023-05-10 LAB — URINALYSIS, ROUTINE W REFLEX MICROSCOPIC
Bilirubin Urine: NEGATIVE
Glucose, UA: NEGATIVE mg/dL
Hgb urine dipstick: NEGATIVE
Ketones, ur: NEGATIVE mg/dL
Leukocytes,Ua: NEGATIVE
Nitrite: NEGATIVE
Protein, ur: NEGATIVE mg/dL
Specific Gravity, Urine: 1.011 (ref 1.005–1.030)
pH: 6 (ref 5.0–8.0)

## 2023-05-10 LAB — CBC WITH DIFFERENTIAL/PLATELET
Abs Immature Granulocytes: 0.03 10*3/uL (ref 0.00–0.07)
Basophils Absolute: 0.1 10*3/uL (ref 0.0–0.1)
Basophils Relative: 1 %
Eosinophils Absolute: 0.2 10*3/uL (ref 0.0–0.5)
Eosinophils Relative: 2 %
HCT: 42.6 % (ref 39.0–52.0)
Hemoglobin: 14.1 g/dL (ref 13.0–17.0)
Immature Granulocytes: 0 %
Lymphocytes Relative: 35 %
Lymphs Abs: 3.5 10*3/uL (ref 0.7–4.0)
MCH: 30.6 pg (ref 26.0–34.0)
MCHC: 33.1 g/dL (ref 30.0–36.0)
MCV: 92.4 fL (ref 80.0–100.0)
Monocytes Absolute: 0.8 10*3/uL (ref 0.1–1.0)
Monocytes Relative: 8 %
Neutro Abs: 5.5 10*3/uL (ref 1.7–7.7)
Neutrophils Relative %: 54 %
Platelets: 204 10*3/uL (ref 150–400)
RBC: 4.61 MIL/uL (ref 4.22–5.81)
RDW: 12.6 % (ref 11.5–15.5)
WBC: 10 10*3/uL (ref 4.0–10.5)
nRBC: 0 % (ref 0.0–0.2)

## 2023-05-10 LAB — COMPREHENSIVE METABOLIC PANEL
ALT: 21 U/L (ref 0–44)
AST: 29 U/L (ref 15–41)
Albumin: 3.7 g/dL (ref 3.5–5.0)
Alkaline Phosphatase: 63 U/L (ref 38–126)
Anion gap: 8 (ref 5–15)
BUN: 12 mg/dL (ref 8–23)
CO2: 26 mmol/L (ref 22–32)
Calcium: 8.5 mg/dL — ABNORMAL LOW (ref 8.9–10.3)
Chloride: 104 mmol/L (ref 98–111)
Creatinine, Ser: 1.04 mg/dL (ref 0.61–1.24)
GFR, Estimated: >60 mL/min (ref >60)
Glucose, Bld: 120 mg/dL — ABNORMAL HIGH (ref 70–99)
Potassium: 3.7 mmol/L (ref 3.5–5.1)
Sodium: 138 mmol/L (ref 135–145)
Total Bilirubin: 0.6 mg/dL (ref 0.3–1.2)
Total Protein: 6.6 g/dL (ref 6.5–8.1)

## 2023-05-10 LAB — PROCALCITONIN: Procalcitonin: <0.1 ng/mL

## 2023-05-10 LAB — CULTURE, BLOOD (SINGLE): Culture: NO GROWTH

## 2023-05-10 LAB — TROPONIN I (HIGH SENSITIVITY): Troponin I (High Sensitivity): 9 ng/L (ref <18)

## 2023-05-10 MED ORDER — ACETAMINOPHEN 325 MG PO TABS: 650.0000 mg | ORAL_TABLET | Freq: Four times a day (QID) | ORAL | Status: DC | PRN

## 2023-05-10 MED ORDER — SODIUM CHLORIDE 0.9 % IV SOLN
2.0000 g | Freq: Once | INTRAVENOUS | Status: AC
  Administered 2023-05-10: 2 g via INTRAVENOUS
  Filled 2023-05-10: qty 20

## 2023-05-10 MED ORDER — SIMVASTATIN 20 MG PO TABS
20.0000 mg | ORAL_TABLET | Freq: Every day | ORAL | Status: DC
  Administered 2023-05-10: 20 mg via ORAL
  Filled 2023-05-10: qty 2

## 2023-05-10 MED ORDER — ONDANSETRON HCL 4 MG PO TABS: 4.0000 mg | ORAL_TABLET | Freq: Four times a day (QID) | ORAL | Status: DC | PRN

## 2023-05-10 MED ORDER — IPRATROPIUM-ALBUTEROL 0.5-2.5 (3) MG/3ML IN SOLN
3.0000 mL | Freq: Once | RESPIRATORY_TRACT | Status: AC
  Administered 2023-05-10: 3 mL via RESPIRATORY_TRACT
  Filled 2023-05-10: qty 3

## 2023-05-10 MED ORDER — MOMETASONE FURO-FORMOTEROL FUM 200-5 MCG/ACT IN AERO
2.0000 | INHALATION_SPRAY | Freq: Two times a day (BID) | RESPIRATORY_TRACT | Status: DC
  Filled 2023-05-10: qty 8.8

## 2023-05-10 MED ORDER — HYDRALAZINE HCL 20 MG/ML IJ SOLN: 10.0000 mg | Freq: Four times a day (QID) | INTRAMUSCULAR | Status: DC | PRN

## 2023-05-10 MED ORDER — ONDANSETRON HCL 4 MG/2ML IJ SOLN
4.0000 mg | Freq: Four times a day (QID) | INTRAMUSCULAR | Status: DC | PRN
  Administered 2023-05-10: 4 mg via INTRAVENOUS
  Filled 2023-05-10: qty 2

## 2023-05-10 MED ORDER — ALLOPURINOL 100 MG PO TABS
100.0000 mg | ORAL_TABLET | ORAL | Status: DC
  Administered 2023-05-11: 100 mg via ORAL
  Filled 2023-05-10: qty 1

## 2023-05-10 MED ORDER — ACETAMINOPHEN 650 MG RE SUPP: 650.0000 mg | Freq: Four times a day (QID) | RECTAL | Status: DC | PRN

## 2023-05-10 MED ORDER — SODIUM CHLORIDE 0.9 % IV SOLN
500.0000 mg | Freq: Once | INTRAVENOUS | Status: AC
  Administered 2023-05-10: 500 mg via INTRAVENOUS
  Filled 2023-05-10 (×2): qty 5

## 2023-05-10 MED ORDER — LOSARTAN POTASSIUM 50 MG PO TABS
25.0000 mg | ORAL_TABLET | Freq: Every day | ORAL | Status: DC
  Administered 2023-05-10: 25 mg via ORAL
  Filled 2023-05-10: qty 1

## 2023-05-10 MED ORDER — SODIUM CHLORIDE 0.9% FLUSH
3.0000 mL | Freq: Two times a day (BID) | INTRAVENOUS | Status: DC
  Administered 2023-05-10 (×2): 3 mL via INTRAVENOUS

## 2023-05-10 MED ORDER — LOSARTAN POTASSIUM 50 MG PO TABS
50.0000 mg | ORAL_TABLET | Freq: Every day | ORAL | Status: DC
  Administered 2023-05-11: 50 mg via ORAL
  Filled 2023-05-10: qty 1

## 2023-05-10 MED ORDER — RIVAROXABAN 15 MG PO TABS
15.0000 mg | ORAL_TABLET | Freq: Every day | ORAL | Status: DC
  Administered 2023-05-10: 15 mg via ORAL
  Filled 2023-05-10: qty 1

## 2023-05-10 MED ORDER — METOPROLOL SUCCINATE ER 50 MG PO TB24
50.0000 mg | ORAL_TABLET | Freq: Every day | ORAL | Status: DC
  Administered 2023-05-10 – 2023-05-11 (×2): 50 mg via ORAL
  Filled 2023-05-10 (×2): qty 1

## 2023-05-10 MED ORDER — ZINC 100 MG PO TABS: ORAL_TABLET | Freq: Every day | ORAL | Status: DC

## 2023-05-10 MED ORDER — MAGNESIUM OXIDE 400 MG PO TABS
400.0000 mg | ORAL_TABLET | Freq: Every day | ORAL | Status: DC
  Administered 2023-05-11: 400 mg via ORAL
  Filled 2023-05-10 (×2): qty 1

## 2023-05-10 NOTE — H&P (Signed)
History and Physical    Patient: Carlos Richard:[id number] DOB: 08-09-1932 DOA: 05/10/2023 DOS: the patient was seen and examined on 05/10/2023 PCP: Dorcas Carrow, DO  Patient coming from: Home  Chief Complaint:  Chief Complaint  Patient presents with   Altered Mental Status    HPI: Carlos Richard is a 89 y.o. male with medical history significant for Chronic atrial fibrillation on Xarelto, COPD, hypertension, mild cognitive impairment, gout, essential tremor s/p deep brain stimulator implant 2006 at [facility], BPH, admitted in April 2024 for syncope with workup significant only for orthostatic hypotension who presents to the ED by EMS with a 1 day history of confusion.  According to wife, patient has been standing around the house, forgetting what he is doing, being slow to respond.  He was previously in his usual state of health.  Patient noted to have a congested cough but no shortness of breath fever or chills.  Wife states his cough has been chronic for months to years.  No recent diarrhea, vomiting.  Patient did follow-up with neurologist last month in May and his dizzy spells were attributed to vasovagal episodes. ED course and data review: BP 178/75 with otherwise normal vitals. Labs unremarkable and included CBC, CMP, troponin, procalcitonin and urinalysis. An EKG, personally viewed and interpreted showed a flutter at 61 with 4-1 block. Chest x-ray showed interstitial opacities right lower lung suspicious for infection or aspiration CT head no acute intracranial abnormality.  Stable left frontoparietal deep brain stimulator. Patient was treated with DuoNeb, Rocephin and azithromycin and hospitalist consulted for admission.     Past Medical History:  Diagnosis Date   Atrial fibrillation (HCC)    COPD (chronic obstructive pulmonary disease) (HCC)    Hypertension    Near syncope    Tremors of nervous system    Past Surgical History:  Procedure Laterality Date   BACK  SURGERY     BRAIN SURGERY     stimulator placement for tremors   EYE SURGERY Left 2020   cataract surgery nov    EYE SURGERY Right 2020   cataract surgery dec   Social History:  reports that he quit smoking about 44 years ago. His smoking use included cigarettes. He has never used smokeless tobacco. He reports current alcohol use. He reports that he does not use drugs.  No Known Allergies  Family History  Problem Relation Age of Onset   Tuberculosis Mother    Tremor Paternal Grandmother    Obesity Sister    Prostate cancer Neg Hx    Bladder Cancer Neg Hx    Kidney cancer Neg Hx     Prior to Admission medications   Medication Sig Start Date End Date Taking? Authorizing Provider  allopurinol (ZYLOPRIM) 100 MG tablet Take 1 tablet (100 mg total) by mouth daily. Monday, Wednesday, Friday 12/14/22   Olevia Perches P, DO  fluticasone-salmeterol (ADVAIR DISKUS) 250-50 MCG/ACT AEPB INHALE 1 PUFF INTO THE LUNGS TWICE A DAY 12/14/22   Johnson, Megan P, DO  furosemide (LASIX) 20 MG tablet TAKE 1 TABLET DAILY. Patient taking differently: Take 20 mg by mouth daily. Monday, Wednesday, Friday 01/18/23   Laurier Nancy, MD  losartan (COZAAR) 25 MG tablet once a day 03/16/23   Laurier Nancy, MD  magnesium oxide (MAG-OX) 400 MG tablet Take 400 mg by mouth daily.    [provider]  metoprolol succinate (TOPROL-XL) 50 MG 24 hr tablet TAKE 1 TABLET DAILY. 03/16/23   Laurier Nancy,  MD  Multiple Vitamins-Minerals (ZINC PO) Take 1 tablet by mouth daily.    [provider]  simvastatin (ZOCOR) 20 MG tablet Take 1 tablet (20 mg total) by mouth daily. 12/14/22   Johnson, Megan P, DO  XARELTO 15 MG TABS tablet Take 1 tablet (15 mg total) by mouth daily with supper. 12/14/22   Johnson, Megan P, DO  Zinc 50 MG TABS Take 50 mg by mouth daily.     [provider]    Physical Exam: Vitals:   05/10/23 0012 05/10/23 0014 05/10/23 0200  BP: (!) 178/75  (!) 175/83  Pulse: 65  64  Resp: 18   13  Temp: 98.2 F (36.8 C)    TempSrc: Oral    SpO2: 96%  95%  Weight:  65.8 kg   Height:  5\' 9"  (1.753 m)    Physical Exam Vitals and nursing note reviewed.  Constitutional:      General: He is not in acute distress. HENT:     Head: Normocephalic and atraumatic.  Cardiovascular:     Rate and Rhythm: Normal rate and regular rhythm.     Heart sounds: Normal heart sounds.  Pulmonary:     Effort: Pulmonary effort is normal.     Breath sounds: Normal breath sounds.  Abdominal:     Palpations: Abdomen is soft.     Tenderness: There is no abdominal tenderness.  Neurological:     Mental Status: Mental status is at baseline.     Labs on Admission: I have personally reviewed following labs and imaging studies  CBC: Recent Labs  Lab 05/10/23 0015  WBC 10.0  NEUTROABS 5.5  HGB 14.1  HCT 42.6  MCV 92.4  PLT 204   Basic Metabolic Panel: Recent Labs  Lab 05/10/23 0015  NA 138  K 3.7  CL 104  CO2 26  GLUCOSE 120*  BUN 12  CREATININE 1.04  CALCIUM 8.5*   GFR: Estimated Creatinine Clearance: 44.8 mL/min (by C-G formula based on SCr of 1.04 mg/dL). Liver Function Tests: Recent Labs  Lab 05/10/23 0015  AST 29  ALT 21  ALKPHOS 63  BILITOT 0.6  PROT 6.6  ALBUMIN 3.7   No results for input(s): "LIPASE", "AMYLASE" in the last 168 hours. No results for input(s): "AMMONIA" in the last 168 hours. Coagulation Profile: No results for input(s): "INR", "PROTIME" in the last 168 hours. Cardiac Enzymes: No results for input(s): "CKTOTAL", "CKMB", "CKMBINDEX", "TROPONINI" in the last 168 hours. BNP (last 3 results) No results for input(s): "PROBNP" in the last 8760 hours. HbA1C: No results for input(s): "HGBA1C" in the last 72 hours. CBG: No results for input(s): "GLUCAP" in the last 168 hours. Lipid Profile: No results for input(s): "CHOL", "HDL", "LDLCALC", "TRIG", "CHOLHDL", "LDLDIRECT" in the last 72 hours. Thyroid Function Tests: No results for input(s): "TSH",  "T4TOTAL", "FREET4", "T3FREE", "THYROIDAB" in the last 72 hours. Anemia Panel: No results for input(s): "VITAMINB12", "FOLATE", "FERRITIN", "TIBC", "IRON", "RETICCTPCT" in the last 72 hours. Urine analysis:    Component Value Date/Time   COLORURINE YELLOW (A) 05/10/2023 0107   APPEARANCEUR CLEAR (A) 05/10/2023 0107   APPEARANCEUR Clear 06/15/2022 0926   LABSPEC 1.011 05/10/2023 0107   LABSPEC 1.027 11/13/2013 1832   PHURINE 6.0 05/10/2023 0107   GLUCOSEU NEGATIVE 05/10/2023 0107   GLUCOSEU Negative 11/13/2013 1832   HGBUR NEGATIVE 05/10/2023 0107   BILIRUBINUR NEGATIVE 05/10/2023 0107   BILIRUBINUR Negative 06/15/2022 0926   BILIRUBINUR Negative 11/13/2013 1832   KETONESUR NEGATIVE  05/10/2023 0107   PROTEINUR NEGATIVE 05/10/2023 0107   NITRITE NEGATIVE 05/10/2023 0107   LEUKOCYTESUR NEGATIVE 05/10/2023 0107   LEUKOCYTESUR Negative 11/13/2013 1832    Radiological Exams on Admission: DG Chest Portable 1 View  Result Date: 05/10/2023 CLINICAL DATA:  Confused all day. EXAM: PORTABLE CHEST 1 VIEW COMPARISON:  Radiographs 02/20/2023 FINDINGS: Stable cardiomediastinal silhouette. Aortic atherosclerotic calcification. Interstitial opacities in the right lower lung suspicious for infection or aspiration. No pleural effusion pneumothorax. Left chest wall neurostimulator. IMPRESSION: Interstitial opacities in the right lower lung suspicious for infection or aspiration. Electronically Signed   By: Minerva Fester M.D.   On: 05/10/2023 01:22   CT HEAD WO CONTRAST ( )  Result Date: 05/10/2023 CLINICAL DATA:  Mental status change EXAM: CT HEAD WITHOUT CONTRAST TECHNIQUE: Contiguous axial images were obtained from the base of the skull through the vertex without intravenous contrast. RADIATION DOSE REDUCTION: This exam was performed according to the departmental dose-optimization program which includes automated exposure control, adjustment of the mA and/or kV according to patient size and/or use  of iterative reconstruction technique. COMPARISON:  CT head 02/20/2023 FINDINGS: Brain: No intracranial hemorrhage, mass effect, or evidence of acute infarct. No hydrocephalus. No extra-axial fluid collection. Generalized cerebral atrophy. Ill-defined hypoattenuation within the cerebral white matter is nonspecific but consistent with chronic small vessel ischemic disease. Stable left frontal parietal deep brain stimulator device. Vascular: No hyperdense vessel. Intracranial arterial calcification. Skull: No fracture or focal lesion. Sinuses/Orbits: No acute finding. Paranasal sinuses and mastoid air cells are well aerated. Other: None. IMPRESSION: 1. No acute intracranial abnormality. 2. Stable left frontoparietal deep brain stimulator. Electronically Signed   By: Minerva Fester M.D.   On: 05/10/2023 01:20     Data Reviewed: Relevant notes from primary care and specialist visits, past discharge summaries as available in EHR, including Care Everywhere. Prior diagnostic testing as pertinent to current admission diagnoses Updated medications and problem lists for reconciliation ED course, including vitals, labs, imaging, treatment and response to treatment Triage notes, nursing and pharmacy notes and ED provider's notes Notable results as noted in HPI   Assessment and Plan: * Acute confusion Mild cognitive impairment (per past documentation) Etiology uncertain.  Neuroexam is nonfocal.  CT head negative no stigmata of infection except for abnormality on chest x-ray. Blood pressure was slightly elevated with SBP 175 so not suspecting PRES Low suspicion for TIA. Question whether related to underlying cognitive impairment Continue neurologic checks Fall and aspiration precautions Unable to get MRI due to deep brain stimulator (patient apparently scheduled for MRI at [facility] next month) Carotid Doppler ordered   Abnormal chest x-ray - RLL Chronic cough COPD Chest x-ray Interstitial opacities in the  right lower lung suspicious for infection or aspiration Patient has a chronic cough but no shortness of breath, fever or tachycardia or leukocytosis and procalcitonin less than 0.1 Received DuoNebs in the ED along with Rocephin and azithromycin Continue home inhalers with DuoNebs as needed Holding off on further antibiotics at this time Aspiration precautions Will get a speech therapy evaluation to evaluate for aspiration  Atrial fibrillation with slow ventricular response (HCC) Pulse in the low 60s EKG showing a flutter with 4-1 block Will hold metoprolol tonight Numerous cardiac monitoring Continue Xarelto for stroke prevention  History of syncope Hospitalized in April 2024 with syncope attributed to orthostatic hypotension Had a normal echo in April 2024 with EF 60 to 65%.  Repeated outpatient 5/3 at [facility] with  ?  (Moderate AI/AS) Can consider cardiology consult  HTN (hypertension) Continue losartan.  Holding metoprolol due to bradycardia  S/P deep brain stimulator placement for essential tremor 2006 at [facility] Last follow-up at [facility] was 2021 No acute issues suspected     DVT prophylaxis: Xarelto  Consults: none  Advance Care Planning:   Code Status: Prior   Family Communication: Wife at bedside  Disposition Plan: Back to previous home environment  Severity of Illness: The appropriate patient status for this patient is OBSERVATION. Observation status is judged to be reasonable and necessary in order to provide the required intensity of service to ensure the patient's safety. The patient's presenting symptoms, physical exam findings, and initial radiographic and laboratory data in the context of their medical condition is felt to place them at decreased risk for further clinical deterioration. Furthermore, it is anticipated that the patient will be medically stable for discharge from the hospital within 2 midnights of admission.   Author: Andris Baumann, MD 05/10/2023  2:54 AM  For on call review [email or web address].

## 2023-05-10 NOTE — Assessment & Plan Note (Signed)
Continue losartan.  Holding metoprolol due to bradycardia

## 2023-05-10 NOTE — ED Notes (Signed)
Patient placed on 2L [place] due to oxygen saturation in the mid/upper 80's. Patient appeared tachypneic 20-25 respirations per minute. Oxygen Saturation improved to 97%. MD notified via messaging.

## 2023-05-10 NOTE — Assessment & Plan Note (Addendum)
Chronic cough COPD Chest x-ray Interstitial opacities in the right lower lung suspicious for infection or aspiration Patient has a chronic cough but no shortness of breath, fever or tachycardia or leukocytosis and procalcitonin less than 0.1 Received DuoNebs in the ED along with Rocephin and azithromycin Continue home inhalers with DuoNebs as needed Holding off on further antibiotics at this time Aspiration precautions Will get a speech therapy evaluation to evaluate for aspiration

## 2023-05-10 NOTE — Progress Notes (Signed)
Mobility Specialist - Progress Note     05/10/23 1000  Mobility  Activity Ambulated with assistance in room;Stood at bedside;Dangled on edge of bed  Level of Assistance Standby assist, set-up cues, supervision of patient - no hands on  Assistive Device None  Distance Ambulated (ft) 3 ft  Range of Motion/Exercises Active  Activity Response Tolerated well  Mobility Referral Yes  $Mobility charge 1 Mobility  Mobility Specialist Start Time (ACUTE ONLY) 1027  Mobility Specialist Stop Time (ACUTE ONLY) 1038  Mobility Specialist Time Calculation (min) (ACUTE ONLY) 11 min   Author responding to bed alarm. Pt OOB upon entry on 2L. Pt standing beside bed trying to walk to bathroom and pulled off leads. Pt given urinal and returned to bed. RN called to reapply leads. Pt left EOB with needs in reach and bed alarm activated.   [name] Mobility Specialist 05/10/23, 10:46 AM

## 2023-05-10 NOTE — ED Provider Notes (Signed)
[facility] Provider Note    Event Date/Time   First MD Initiated Contact with Patient 05/10/23 0010     (approximate)   History   Altered Mental Status   HPI  Carlos Richard is a 89 y.o. male here with altered mental status.  History provided primarily by the patient's wife.  Per report, he has been confused throughout the day today.  Has been standing and forgetting what he was doing.  He is been slow to respond.  This occurs in the setting of general fatigue throughout the day today.  No known recent sick contacts.  He has had a mild cough.  No falls.  No known medication changes.     Physical Exam   Triage Vital Signs: ED Triage Vitals  Enc Vitals Group     BP 05/10/23 0012 (!) 178/75     Pulse Rate 05/10/23 0012 65     Resp 05/10/23 0012 18     Temp 05/10/23 0012 98.2 F (36.8 C)     Temp Source 05/10/23 0012 Oral     SpO2 05/10/23 0012 96 %     Weight 05/10/23 0014 145 lb (65.8 kg)     Height 05/10/23 0014 5\' 9"  (1.753 m)     Head Circumference --      Peak Flow --      Pain Score 05/10/23 0014 0     Pain Loc --      Pain Edu? --      Excl. in GC? --     Most recent vital signs: Vitals:   05/10/23 0200 05/10/23 0314  BP: (!) 175/83 (!) 161/74  Pulse: 64 67  Resp: 13 20  Temp:  98.3 F (36.8 C)  SpO2: 95% 96%     General: Awake, no distress.  CV:  Good peripheral perfusion.  Resp:  Normal work of breathing.  Slight rales noted in the right greater than left bases. Abd:  No distention.  No tenderness. Other:  Oriented to person but not place or time.  Moves all extremities with 5-5 strength.  Gait deferred.   ED Results / Procedures / Treatments   Labs (all labs ordered are listed, but only abnormal results are displayed) Labs Reviewed  COMPREHENSIVE METABOLIC PANEL - Abnormal; Notable for the following components:      Result Value   Glucose, Bld 120 (*)    Calcium 8.5 (*)    All other components within normal  limits  URINALYSIS, ROUTINE W REFLEX MICROSCOPIC - Abnormal; Notable for the following components:   Color, Urine YELLOW (*)    APPearance CLEAR (*)    All other components within normal limits  BLOOD GAS, VENOUS - Abnormal; Notable for the following components:   pCO2, Ven 38 (*)    All other components within normal limits  CBG MONITORING, ED - Abnormal; Notable for the following components:   Glucose-Capillary 117 (*)    All other components within normal limits  CULTURE, BLOOD (SINGLE)  CBC WITH DIFFERENTIAL/PLATELET  PROCALCITONIN  TROPONIN I (HIGH SENSITIVITY)     EKG Normal sinus rhythm, trickle rate 61.  QRS 95, QTc 444.  No acute ST elevation or depression or acute evidence of acute ischemia or infarct.   RADIOLOGY CT head: No acute intracranial malady Chest x-ray: Interstitial opacities in the right lower lung suspicious for infection or aspiration   I also independently reviewed and agree with radiologist interpretations.   PROCEDURES:  Critical Care  performed: No   MEDICATIONS ORDERED IN ED: Medications  losartan (COZAAR) tablet 25 mg (has no administration in time range)  simvastatin (ZOCOR) tablet 20 mg (has no administration in time range)  allopurinol (ZYLOPRIM) tablet 100 mg (has no administration in time range)  Rivaroxaban (XARELTO) tablet 15 mg (has no administration in time range)  sodium chloride flush (NS) 0.9 % injection 3 mL (3 mLs Intravenous Given 05/10/23 0325)  acetaminophen (TYLENOL) tablet 650 mg (has no administration in time range)    Or  acetaminophen (TYLENOL) suppository 650 mg (has no administration in time range)  ondansetron (ZOFRAN) tablet 4 mg ( Oral See Alternative 05/10/23 0325)    Or  ondansetron (ZOFRAN) injection 4 mg (4 mg Intravenous Given 05/10/23 0325)  cefTRIAXone (ROCEPHIN) 2 g in sodium chloride 0.9 % 100 mL IVPB (0 g Intravenous Stopped 05/10/23 0244)  azithromycin (ZITHROMAX) 500 mg in sodium chloride 0.9 % 250 mL  IVPB (0 mg Intravenous Stopped 05/10/23 0444)  ipratropium-albuterol (DUONEB) 0.5-2.5 (3) MG/3ML nebulizer solution 3 mL (3 mLs Nebulization Given 05/10/23 0246)     IMPRESSION / MDM / ASSESSMENT AND PLAN / ED COURSE  I reviewed the triage vital signs and the nursing notes.                              Differential diagnosis includes, but is not limited to, acute encephalopathy secondary to occult infection such as UTI, pneumonia, stroke, medication effect, delirium  Patient's presentation is most consistent with acute presentation with potential threat to life or bodily function.  The patient is on the cardiac monitor to evaluate for evidence of arrhythmia and/or significant heart rate changes  89 yo M here with confusion. Likely delirium. Unclear trigger, though CXR shows possible infiltrate versus PNA. CBC without major leukocytosis. CMP unremarkable. He has no focal neuro deficits. CT head is negative. Given his age and ongoing delirium, will admit. Rocephin/azthro given for findings of PNA.    FINAL CLINICAL IMPRESSION(S) / ED DIAGNOSES   Final diagnoses:  Disorientation  Pneumonia of right lower lobe due to infectious organism     Rx / DC Orders   ED Discharge Orders     None        Note:  This document was prepared using Dragon voice recognition software and may include unintentional dictation errors.   Shaune Pollack, MD 05/10/23 [phone]

## 2023-05-10 NOTE — ED Triage Notes (Signed)
Pt to ED via EMS from home, per ems wife reports pt has been intermittently confused all day, no hx of dementia. Wife states he's just been standing around the house. Pt denies pain or discomfort, pt is a&o x2.

## 2023-05-10 NOTE — Assessment & Plan Note (Addendum)
Hospitalized in April 2024 with syncope attributed to orthostatic hypotension Had a normal echo in April 2024 with EF 60 to 65%.  Repeated outpatient 5/3 at [facility] with  ?  (Moderate AI/AS) Can consider cardiology consult

## 2023-05-10 NOTE — Assessment & Plan Note (Addendum)
Mild cognitive impairment (per past documentation) Etiology uncertain.  Neuroexam is nonfocal.  CT head negative no stigmata of infection except for abnormality on chest x-ray. Blood pressure was slightly elevated with SBP 175 so not suspecting PRES Low suspicion for TIA. Question whether related to underlying cognitive impairment Continue neurologic checks Fall and aspiration precautions Unable to get MRI due to deep brain stimulator (patient apparently scheduled for MRI at [facility] next month) Carotid Doppler ordered

## 2023-05-10 NOTE — Assessment & Plan Note (Addendum)
Last follow-up at [facility] was 2021 No acute issues suspected

## 2023-05-10 NOTE — Progress Notes (Signed)
PHARMACIST - PHYSICIAN ORDER COMMUNICATION  CONCERNING: P&T Medication Policy on Herbal Medications  DESCRIPTION:  This patient's order for Zinc tablets has been noted.  This product(s) is classified as an "herbal" or natural product. Due to a lack of definitive safety studies or FDA approval, nonstandard manufacturing practices, plus the potential risk of unknown drug-drug interactions while on inpatient medications, the Pharmacy and Therapeutics Committee does not permit the use of "herbal" or natural products of this type within [facility].   ACTION TAKEN: The pharmacy department is unable to verify this order at this time and your patient has been informed of this safety policy. Please reevaluate patient's clinical condition at discharge and address if the herbal or natural product(s) should be resumed at that time.

## 2023-05-10 NOTE — Progress Notes (Addendum)
Wife at bedside. What brought in by wife with patient being confused the night before. He does have some memory issues. Fairly independent at home ambulates by himself and does his own ADLs. Workup in the ED did not reveal any source of infection CT head was negative labs otherwise stable. Blood pressure better on the higher side medications adjusted. Patient ambulated with mobility therapist 3 feet around the nurses station in the ED. clinically does not seem to have pneumonia. I am holding off on antibiotics for now. Patient was = seen by speech therapy. No evidence of clinical signs symptoms of aspiration for now. CT head no acute abnormality. And carotid ultrasound bilateral so far negative. Will continue to monitor if remains stable discharge to home tomorrow.  Wife is agreeable. Patient is a full code. TOC for discharge planning

## 2023-05-10 NOTE — Assessment & Plan Note (Signed)
Pulse in the low 60s EKG showing a flutter with 4-1 block Will hold metoprolol tonight Numerous cardiac monitoring Continue Xarelto for stroke prevention

## 2023-05-10 NOTE — Evaluation (Signed)
Clinical/Bedside Swallow Evaluation Patient Details  Name: Carlos Richard MRN: [id number] Date of Birth: [birth year]  Today's Date: 05/10/2023 Time: SLP Start Time (ACUTE ONLY): 1445 SLP Stop Time (ACUTE ONLY): 1530 SLP Time Calculation (min) (ACUTE ONLY): 45 min  Past Medical History:  Past Medical History:  Diagnosis Date   Atrial fibrillation (HCC)    COPD (chronic obstructive pulmonary disease) (HCC)    Hypertension    Near syncope    Tremors of nervous system    Past Surgical History:  Past Surgical History:  Procedure Laterality Date   BACK SURGERY     BRAIN SURGERY     stimulator placement for tremors   EYE SURGERY Left 2020   cataract surgery nov    EYE SURGERY Right 2020   cataract surgery dec   HPI:  Pt is a 89 y.o. male with medical history significant for Chronic atrial fibrillation on Xarelto, COPD, hypertension, Mild Cognitive Impairment(MCI), gout, essential tremor s/p deep brain stimulator implant 2006 at [facility], BPH, admitted in April 2024 for syncope with workup significant only for orthostatic hypotension who presents to the ED by EMS with a 1 day history of confusion.  According to wife, patient has been standing around the house, forgetting what he is doing, being slow to respond.  He was previously in his usual state of health.  Patient noted to have a congested cough but no shortness of breath fever or chills.  Wife states his cough has been chronic "for months to years".  No recent diarrhea, vomiting.  Patient did follow-up with neurologist last month in May and his dizzy spells were attributed to vasovagal episodes.    Head CT in April and now this admit: "No acute findings.   2. Moderate chronic ischemic microvascular disease.   3. Stable left frontoparietal deep brain stimulator device with tip   over the region of the left thalamus.".  CXR this admit: Interstitial opacities in the right lower lung.  CXR last admit in April: negative.    Assessment / Plan /  Recommendation  Clinical Impression   Pt seen for BSE this PM. Pt awake, sat himself on the EOB for the swallow eval. Pt verbally engaged w/ this SLP and followed all instructions. Pt fed self sitting EOB. On RA at this eval, previously on 2L [place] O2 support. Afebrile. WBC WNL.  Pt appears to present w/ functional oropharyngeal phase swallowing w/ No overt, oropharyngeal phase dysphagia noted, No neuromuscular deficits noted. Pt consumed po trials w/ No immediate, overt clinical s/s of aspiration during po trials.  Pt appears at reduced risk for aspiration following general aspiration precautions.  However, pt does have challenging factors that could impact oropharyngeal swallowing to include Baseline declined Pulmonary status - COPD, Mild Cognitive Impairment(MCI), deconditioning w/ illness/hospitalization, and advanced age. These factors can increase risk for aspiration, dysphagia as well as decreased oral intake overall.   During po trials, pt consumed all consistencies w/ no overt coughing, decline in vocal quality, or change in respiratory presentation during/post trials. O2 sats 98%. Oral phase appeared [facility] w/ timely bolus management, mastication, and control of bolus propulsion for A-P transfer for swallowing. Oral clearing achieved w/ all trial consistencies -- moistened, soft foods given.  OM Exam appeared [facility] w/ no unilateral weakness noted. Speech intelligible. Pt fed self w/ setup support.   Recommend continue a Regular consistency diet w/ well-Cut meats, moistened foods for ease of chewing; Thin liquids -- Cup drinking as pt does not use straws  at home. Recommend general aspiration precautions, Reflux precautions in setting of Belching, sitting EOB or in chair for meals. Reduce distractions and Talking at meals. Pills WHOLE in Puree IF desired for safer, easier swallowing.  Education given on Pills in Puree; food consistencies and easy to eat options; general aspiration and Reflux precautions  to pt - pt agreed.  MD/NSG to reconsult if any new needs arise while admitted. NSG updated, agreed. MD updated. Recommend Dietician f/u for support if needed post discharge. SLP Visit Diagnosis: Dysphagia, unspecified (R13.10)    Aspiration Risk   (reduced following general aspiration precautions)    Diet Recommendation    continue a Regular consistency diet w/ well-Cut meats, moistened foods for ease of chewing; Thin liquids -- Cup drinking as pt does not use straws at home. Recommend general aspiration precautions, Reflux precautions in setting of Belching, sitting EOB or in chair for meals. Reduce distractions and Talking at meals.  Medication Administration: Whole meds with puree (IF needed for ease of swallowing)    Other  Recommendations Recommended Consults:  (n/a) Oral Care Recommendations: Oral care BID;Patient independent with oral care    Recommendations for follow up therapy are one component of a multi-disciplinary discharge planning process, led by the attending physician.  Recommendations may be updated based on patient status, additional functional criteria and insurance authorization.  Follow up Recommendations No SLP follow up      Assistance Recommended at Discharge  Intermittent d/t MCI  Functional Status Assessment Patient has not had a recent decline in their functional status  Frequency and Duration  (n/a)   (n/a)       Prognosis Prognosis for improved oropharyngeal function:  (n/a) Barriers to Reach Goals:  (n/a) Barriers/Prognosis Comment:  (n/a)      Swallow Study   General Date of Onset: 05/10/23 HPI: Pt is a 89 y.o. male with medical history significant for Chronic atrial fibrillation on Xarelto, COPD, hypertension, Mild Cognitive Impairment(MCI), gout, essential tremor s/p deep brain stimulator implant 2006 at [facility], BPH, admitted in April 2024 for syncope with workup significant only for orthostatic hypotension who presents to the ED by EMS with a 1 day  history of confusion.  According to wife, patient has been standing around the house, forgetting what he is doing, being slow to respond.  He was previously in his usual state of health.  Patient noted to have a congested cough but no shortness of breath fever or chills.  Wife states his cough has been chronic "for months to years".  No recent diarrhea, vomiting.  Patient did follow-up with neurologist last month in May and his dizzy spells were attributed to vasovagal episodes.    Head CT in April and now this admit: "No acute findings.   2. Moderate chronic ischemic microvascular disease.   3. Stable left frontoparietal deep brain stimulator device with tip   over the region of the left thalamus.".  CXR this admit: Interstitial opacities in the right lower lung.  CXR last admit in April: negative. Type of Study: Bedside Swallow Evaluation Previous Swallow Assessment: none Diet Prior to this Study: Regular;Thin liquids (Level 0) Temperature Spikes Noted: No (wbc 10.0) Respiratory Status: Room air (noted on [place] O2 support during the night, early AM) History of Recent Intubation: No Behavior/Cognition: Alert;Cooperative;Pleasant mood (MCI baseline) Oral Cavity Assessment: Within Functional Limits Oral Care Completed by SLP: Recent completion by staff Oral Cavity - Dentition: Missing dentition (few) Vision: Functional for self-feeding Self-Feeding Abilities: Able to feed self;Needs  set up Patient Positioning: Upright in bed (pt sat EOB) Baseline Vocal Quality: Normal Volitional Cough: Strong Volitional Swallow: Able to elicit    Oral/Motor/Sensory Function Overall Oral Motor/Sensory Function: Within functional limits   Ice Chips Ice chips: Not tested   Thin Liquid Thin Liquid: Within functional limits Presentation: Cup;Self Fed;Straw (Bottle.  4 trials via each.  Pt does not use straws at home.)    Nectar Thick Nectar Thick Liquid: Not tested   Honey Thick Honey Thick Liquid: Not tested   Puree  Puree: Within functional limits Presentation: Self Fed;Spoon (8+ trials)   Solid     Solid: Within functional limits (moistened) Presentation: Self Fed;Spoon (8+ trials)        Jerilynn Som, MS, CCC-SLP Speech Language Pathologist Rehab Services; [facility] [phone] (ascom) Coen Miyasato 05/10/2023,3:40 PM

## 2023-05-11 DIAGNOSIS — I10 Essential (primary) hypertension: Secondary | ICD-10-CM

## 2023-05-11 DIAGNOSIS — G3184 Mild cognitive impairment, so stated: Secondary | ICD-10-CM | POA: Diagnosis not present

## 2023-05-11 DIAGNOSIS — J439 Emphysema, unspecified: Secondary | ICD-10-CM

## 2023-05-11 MED ORDER — LOSARTAN POTASSIUM 50 MG PO TABS: 50.0000 mg | ORAL_TABLET | Freq: Every day | ORAL | 0 refills | Status: DC

## 2023-05-11 NOTE — Discharge Summary (Signed)
Physician Discharge Summary   Patient: Carlos Richard MRN: [id number] DOB: 13-Jan-1932  Admit date:     05/10/2023  Discharge date: 05/11/23  Discharge Physician: Enedina Finner   PCP: Dorcas Carrow, DO   Recommendations at discharge:   follow-up with PCP in 1 to 2 week  Discharge Diagnoses: Principal Problem:   Acute confusion Active Problems:   Minimal cognitive impairment   Abnormal chest x-ray - RLL   Atrial fibrillation with slow ventricular response (HCC)   COPD (chronic obstructive pulmonary disease) (HCC)   History of syncope   HTN (hypertension)   S/P deep brain stimulator placement for essential tremor 2006 at [facility] is a 89 y.o. male with medical history significant for Chronic atrial fibrillation on Xarelto, COPD, hypertension, mild cognitive impairment, gout, essential tremor s/p deep brain stimulator implant 2006 at [facility] BPH, admitted in April 2024 for syncope with workup significant only for orthostatic hypotension who presents to the ED by EMS with a 1 day history of confusion.  According to wife, patient has been standing around the house, forgetting what he is doing, being slow to respond  Labs unremarkable and included CBC, CMP, troponin, procalcitonin and urinalysis. An EKG, personally viewed and interpreted showed a flutter at 61 with 4-1 block. Chest x-ray showed interstitial opacities right lower lung suspicious for infection or aspiration CT head no acute intracranial abnormality.  Stable left frontoparietal deep brain stimulator.  Acute confusion Mild cognitive impairment (per past documentation) Etiology uncertain.  Neuroexam is nonfocal.  CT head negative no stigmata of infection except for abnormality on chest x-ray. Question whether related to underlying cognitive impairment Continue neurologic checks-- patient did not have any neural deficit. He ambulated around the nurses station and on his own in the room without difficulty. Fall  and aspiration precautions Unable to get MRI due to deep brain stimulator (patient apparently scheduled for MRI at [facility] next month) Carotid Doppler -- no acute abnormalityBilateral calcified cervical carotid atherosclerosis, but no Doppler Ultrasound evidence of hemodynamically significant stenosis. Mentation back to baseline. Wife confirms.  Abnormal x-ray-- right lower lobe interstitial opacities -- patient did not have clinical signs symptoms of pneumonia. No chronic cough. Discontinued antibiotic. Patient remains afebrile white count normal.  A fib with slow ventricular response -- continue Xarelto for stroke prevention  S/P deep brain stimulator placement for essential tremor 2006 at [facility] Last follow-up at [facility] was 2021 No acute issues suspected  Hypertension resume home meds -- heart rate remained 59 to 69 he was asymptomatic. No dizziness. Blood pressure remained stable   Overall hemodynamically stable. Discussed with patient and wife regarding discharge their agreeable.      Diet recommendation:  Discharge Diet Orders (From admission, onward)     Start     Ordered   05/11/23 0000  Diet - low sodium heart healthy        05/11/23 0852            DISCHARGE MEDICATION: Allergies as of 05/11/2023   No Known Allergies      Medication List     TAKE these medications    allopurinol 100 MG tablet Commonly known as: ZYLOPRIM Take 1 tablet (100 mg total) by mouth daily. Monday, Wednesday, Friday   fluticasone-salmeterol 250-50 MCG/ACT Aepb Commonly known as: Advair Diskus INHALE 1 PUFF INTO THE LUNGS TWICE A DAY   furosemide 20 MG tablet Commonly known as: LASIX TAKE 1 TABLET DAILY. What changed: additional instructions  losartan 50 MG tablet Commonly known as: COZAAR Take 1 tablet (50 mg total) by mouth daily. What changed:  medication strength how much to take how to take this   magnesium oxide 400 MG tablet Commonly known as: MAG-OX Take 400 mg by  mouth daily.   metoprolol succinate 50 MG 24 hr tablet Commonly known as: TOPROL-XL TAKE 1 TABLET DAILY.   simvastatin 20 MG tablet Commonly known as: ZOCOR Take 1 tablet (20 mg total) by mouth daily.   triamcinolone cream 0.1 % Commonly known as: KENALOG Apply 1 Application topically 2 (two) times daily.   Xarelto 15 MG Tabs tablet Generic drug: Rivaroxaban Take 1 tablet (15 mg total) by mouth daily with supper.   Zinc 50 MG Tabs Take 50 mg by mouth daily.   ZINC PO Take 1 tablet by mouth daily.        Follow-up Information     Olevia Perches P, DO. Schedule an appointment as soon as possible for a visit in 1 week(s).   Specialty: Family Medicine Why: hospital f/u Contact information: [address] [place] [address] [phone]                Discharge Exam: Filed Weights   05/10/23 0014 05/10/23 2050 05/10/23 2116  Weight: 65.8 kg 71.9 kg 73.9 kg  alert and oriented times three respiratory clear to auscultation no respiratory distress cardiovascular both heart sounds normal. No murmur neuro- alert oriented times three. Grossly nonfocal   Condition at discharge: fair  The results of significant diagnostics from this hospitalization (including imaging, microbiology, ancillary and laboratory) are listed below for reference.   Imaging Studies: US Carotid Bilateral  Result Date: 05/10/2023 CLINICAL DATA:  89 year old male with altered mental status. EXAM: BILATERAL CAROTID DUPLEX ULTRASOUND TECHNIQUE: Wallace Cullens scale imaging, color Doppler and duplex ultrasound were performed of bilateral carotid and vertebral arteries in the neck. COMPARISON:  Head CT today reported separately. Cervical spine CT 02/20/2023. Prior carotid Doppler ultrasound 10/31/2019. FINDINGS: Criteria: Quantification of carotid stenosis is based on velocity parameters that correlate the residual internal carotid diameter with NASCET-based stenosis levels, using the diameter of the distal  internal carotid lumen as the denominator for stenosis measurement. The following velocity measurements were obtained: RIGHT ICA: 66 cm/sec CCA: 58 cm/sec SYSTOLIC ICA/CCA RATIO:  1.2 ECA: 75 cm/sec LEFT ICA: 49 cm/sec CCA: 74 cm/sec SYSTOLIC ICA/CCA RATIO:  Less than 1 ECA: 63 cm/sec RIGHT CAROTID ARTERY: Calcified atherosclerosis, also demonstrated on cervical spine CT in April. RIGHT VERTEBRAL ARTERY: Patent with antegrade flow detected on image 32. LEFT CAROTID ARTERY: Calcified atherosclerosis, also demonstrated on cervical spine CT. LEFT VERTEBRAL ARTERY: Patent with antegrade flow detected on image 63. IMPRESSION: Bilateral calcified cervical carotid atherosclerosis, but no Doppler Ultrasound evidence of hemodynamically significant stenosis. Electronically Signed   By: Odessa Fleming M.D.   On: 05/10/2023 05:26   DG Chest Portable 1 View  Result Date: 05/10/2023 CLINICAL DATA:  Confused all day. EXAM: PORTABLE CHEST 1 VIEW COMPARISON:  Radiographs 02/20/2023 FINDINGS: Stable cardiomediastinal silhouette. Aortic atherosclerotic calcification. Interstitial opacities in the right lower lung suspicious for infection or aspiration. No pleural effusion pneumothorax. Left chest wall neurostimulator. IMPRESSION: Interstitial opacities in the right lower lung suspicious for infection or aspiration. Electronically Signed   By: Minerva Fester M.D.   On: 05/10/2023 01:22   CT HEAD WO CONTRAST ( )  Result Date: 05/10/2023 CLINICAL DATA:  Mental status change EXAM: CT HEAD WITHOUT CONTRAST TECHNIQUE: Contiguous axial  images were obtained from the base of the skull through the vertex without intravenous contrast. RADIATION DOSE REDUCTION: This exam was performed according to the departmental dose-optimization program which includes automated exposure control, adjustment of the mA and/or kV according to patient size and/or use of iterative reconstruction technique. COMPARISON:  CT head 02/20/2023 FINDINGS: Brain: No  intracranial hemorrhage, mass effect, or evidence of acute infarct. No hydrocephalus. No extra-axial fluid collection. Generalized cerebral atrophy. Ill-defined hypoattenuation within the cerebral white matter is nonspecific but consistent with chronic small vessel ischemic disease. Stable left frontal parietal deep brain stimulator device. Vascular: No hyperdense vessel. Intracranial arterial calcification. Skull: No fracture or focal lesion. Sinuses/Orbits: No acute finding. Paranasal sinuses and mastoid air cells are well aerated. Other: None. IMPRESSION: 1. No acute intracranial abnormality. 2. Stable left frontoparietal deep brain stimulator. Electronically Signed   By: Minerva Fester M.D.   On: 05/10/2023 01:20    Microbiology: Results for orders placed or performed during the hospital encounter of 05/10/23  Blood culture (single)     Status: None (Preliminary result)   Collection Time: 05/10/23  2:06 AM   Specimen: BLOOD  Result Value Ref Range Status   Specimen Description BLOOD HAND  Final   Special Requests   Final    BOTTLES DRAWN AEROBIC AND ANAEROBIC Blood Culture adequate volume   Culture   Final    NO GROWTH 1 DAY Performed at [facility], [address] [place], [place] [address]    Report Status PENDING  Incomplete    Labs: CBC: Recent Labs  Lab 05/10/23 0015  WBC 10.0  NEUTROABS 5.5  HGB 14.1  HCT 42.6  MCV 92.4  PLT 204   Basic Metabolic Panel: Recent Labs  Lab 05/10/23 0015  NA 138  K 3.7  CL 104  CO2 26  GLUCOSE 120*  BUN 12  CREATININE 1.04  CALCIUM 8.5*   Liver Function Tests: Recent Labs  Lab 05/10/23 0015  AST 29  ALT 21  ALKPHOS 63  BILITOT 0.6  PROT 6.6  ALBUMIN 3.7   CBG: Recent Labs  Lab 05/10/23 0548  GLUCAP 117*    Discharge time spent: greater than 30 minutes.  Signed: Enedina Finner, MD [facility] 05/11/2023

## 2023-05-11 NOTE — Progress Notes (Signed)
PT Cancellation Note  Patient Details Name: Carlos Richard MRN: [id number] DOB: 1932-05-28   Cancelled Treatment:    Reason Eval/Treat Not Completed:  (Consult received and chart reviewed. Upon arrival to room, transport present, assisting patient to staxi chair for discharge.  Unavailable for full PT evaluation.)  Kathline Banbury H. Manson Passey, PT, DPT, NCS 05/11/23, 9:45 AM [phone]

## 2023-05-12 ENCOUNTER — Telehealth: Payer: Self-pay

## 2023-05-12 LAB — CULTURE, BLOOD (SINGLE)

## 2023-05-12 NOTE — Transitions of Care (Post Inpatient/ED Visit) (Signed)
05/12/2023  Name: Carlos Richard MRN: [id number] DOB: September 23, 1932  Today's TOC FU Call Status:    Transition Care Management Follow-up Telephone Call Date of Discharge: 05/11/23 Discharge Facility: [facility]) Type of Discharge: Inpatient Admission Primary Inpatient Discharge Diagnosis:: disorientation How have you been since you were released from the hospital?: Better Any questions or concerns?: No  Items Reviewed: Did you receive and understand the discharge instructions provided?: Yes Medications obtained,verified, and reconciled?: Yes (Medications Reviewed) Any new allergies since your discharge?: No Dietary orders reviewed?: Yes Do you have support at home?: Yes People in Home: spouse  Medications Reviewed Today: Medications Reviewed Today     Reviewed by Karena Addison, LPN (Licensed Practical Nurse) on 05/12/23 at [phone]  Med List Status: <None>   Medication Order Taking? Sig Documenting Provider Last Dose Status Informant  allopurinol (ZYLOPRIM) 100 MG tablet [id number] No Take 1 tablet (100 mg total) by mouth daily. Monday, Wednesday, Friday Dorcas Carrow, DO 05/09/2023 Active Spouse/Significant Other  cyanocobalamin (VITAMIN B12) injection 1,000 mcg [id number]   Dorcas Carrow, DO  Active   fluticasone-salmeterol (ADVAIR DISKUS) 250-50 MCG/ACT AEPB [id number] No INHALE 1 PUFF INTO THE LUNGS TWICE A DAY Johnson, Megan P, DO 05/09/2023 Active Spouse/Significant Other  furosemide (LASIX) 20 MG tablet [id number] No TAKE 1 TABLET DAILY.  Patient taking differently: Take 20 mg by mouth daily. Monday, Wednesday, Friday   Laurier Nancy, MD 05/09/2023 Active Spouse/Significant Other  losartan (COZAAR) 50 MG tablet [id number]  Take 1 tablet (50 mg total) by mouth daily. Enedina Finner, MD  Active   magnesium oxide (MAG-OX) 400 MG tablet [id number] No Take 400 mg by mouth daily. [provider] 05/09/2023 Active Spouse/Significant Other   metoprolol succinate (TOPROL-XL) 50 MG 24 hr tablet [id number] No TAKE 1 TABLET DAILY. Laurier Nancy, MD 05/09/2023 Active Spouse/Significant Other  Multiple Vitamins-Minerals (ZINC PO) [id number] No Take 1 tablet by mouth daily. [provider] 05/09/2023 Active Spouse/Significant Other  simvastatin (ZOCOR) 20 MG tablet [id number] No Take 1 tablet (20 mg total) by mouth daily. Olevia Perches P, DO 05/09/2023 Active Spouse/Significant Other  triamcinolone cream (KENALOG) 0.1 % [id number] No Apply 1 Application topically 2 (two) times daily. [provider] 05/09/2023 Active Spouse/Significant Other  XARELTO 15 MG TABS tablet [id number] No Take 1 tablet (15 mg total) by mouth daily with supper. Olevia Perches P, DO 05/09/2023 Active Spouse/Significant Other  Zinc 50 MG TABS [id number] No Take 50 mg by mouth daily.  [provider] 05/09/2023 Active Spouse/Significant Other  Med List Note Christen Bame 07/03/15 0626): Patient also takes Cherry Juice for gout.             Home Care and Equipment/Supplies: Were Home Health Services Ordered?: NA Any new equipment or medical supplies ordered?: NA  Functional Questionnaire: Do you need assistance with bathing/showering or dressing?: No Do you need assistance with meal preparation?: No Do you need assistance with eating?: No Do you have difficulty maintaining continence: No Do you need assistance with getting out of bed/getting out of a chair/moving?: No Do you have difficulty managing or taking your medications?: No  Follow up appointments reviewed: PCP Follow-up appointment confirmed?: No (no avail appt times, sent message to staff to schedule) MD Provider Line Number:[phone] Given: No Specialist Hospital Follow-up appointment confirmed?: NA Do you need transportation to your follow-up appointment?: No Do you understand care options if your condition(s) worsen?: Yes-patient verbalized  understanding    SIGNATURE  Juanda Crumble, [place] Nurse Health Advisor Direct Dial [phone]

## 2023-05-13 ENCOUNTER — Ambulatory Visit (INDEPENDENT_AMBULATORY_CARE_PROVIDER_SITE_OTHER): Payer: Medicare Other | Admitting: Family Medicine

## 2023-05-13 ENCOUNTER — Encounter: Payer: Self-pay | Admitting: Family Medicine

## 2023-05-13 ENCOUNTER — Ambulatory Visit: Payer: Medicare Other

## 2023-05-13 VITALS — BP 158/66 | HR 62 | Temp 97.7°F | Ht 69.0 in | Wt 163.6 lb

## 2023-05-13 DIAGNOSIS — R9389 Abnormal findings on diagnostic imaging of other specified body structures: Secondary | ICD-10-CM | POA: Diagnosis not present

## 2023-05-13 DIAGNOSIS — Z9689 Presence of other specified functional implants: Secondary | ICD-10-CM

## 2023-05-13 DIAGNOSIS — R410 Disorientation, unspecified: Secondary | ICD-10-CM

## 2023-05-13 DIAGNOSIS — E538 Deficiency of other specified B group vitamins: Secondary | ICD-10-CM | POA: Diagnosis not present

## 2023-05-13 NOTE — Progress Notes (Signed)
BP (!) 158/66 (BP Location: Left Arm, Cuff Size: Normal)   Pulse 62   Temp 97.7 F (36.5 C) (Oral)   Ht 5\' 9"  (1.753 m)   Wt 163 lb 9.6 oz (74.2 kg)   SpO2 (!) 86%   BMI 24.16 kg/m    Subjective:    Patient ID: Carlos Richard, male    DOB: Aug 11, 1932, 89 y.o.   MRN: [id number]  HPI: ODYN SIAM is a 89 y.o. male  Chief Complaint  Patient presents with   Pneumonia   Hospitalization Follow-up   Transition of Care Hospital Follow up.   Hospital/Facility: [facility] D/C Physician:  Dr. Allena Katz D/C Date: 05/11/23  Records Requested: 05/13/23 Records Received: 05/13/23 Records Reviewed: 05/13/23  Diagnoses on Discharge:  Acute confusion   Minimal cognitive impairment   Abnormal chest x-ray - RLL   Atrial fibrillation with slow ventricular response (HCC)   COPD (chronic obstructive pulmonary disease) (HCC)   History of syncope   HTN (hypertension)   S/P deep brain stimulator placement for essential tremor 2006 at [facility]  Date of interactive Contact within 48 hours of discharge: 05/12/23 Contact was through: phone  Date of 7 day or 14 day face-to-face visit: 05/13/23   within 7 days  Outpatient Encounter Medications as of 05/13/2023  Medication Sig   allopurinol (ZYLOPRIM) 100 MG tablet Take 1 tablet (100 mg total) by mouth daily. Monday, Wednesday, Friday   fluticasone-salmeterol (ADVAIR DISKUS) 250-50 MCG/ACT AEPB INHALE 1 PUFF INTO THE LUNGS TWICE A DAY   furosemide (LASIX) 20 MG tablet TAKE 1 TABLET DAILY. (Patient taking differently: Take 20 mg by mouth daily. Monday, Wednesday, Friday)   losartan (COZAAR) 50 MG tablet Take 1 tablet (50 mg total) by mouth daily.   magnesium oxide (MAG-OX) 400 MG tablet Take 400 mg by mouth daily.   metoprolol succinate (TOPROL-XL) 50 MG 24 hr tablet TAKE 1 TABLET DAILY.   Multiple Vitamins-Minerals (ZINC PO) Take 1 tablet by mouth daily.   simvastatin (ZOCOR) 20 MG tablet Take 1 tablet (20 mg total) by mouth daily.   triamcinolone cream  (KENALOG) 0.1 % Apply 1 Application topically 2 (two) times daily.   XARELTO 15 MG TABS tablet Take 1 tablet (15 mg total) by mouth daily with supper.   Zinc 50 MG TABS Take 50 mg by mouth daily.    Facility-Administered Encounter Medications as of 05/13/2023  Medication   cyanocobalamin (VITAMIN B12) injection 1,000 mcg  Per Hospitalist: " DEFOREST FLOERKE is a 89 y.o. male with medical history significant for Chronic atrial fibrillation on Xarelto, COPD, hypertension, mild cognitive impairment, gout, essential tremor s/p deep brain stimulator implant 2006 at [facility], BPH, admitted in April 2024 for syncope with workup significant only for orthostatic hypotension who presents to the ED by EMS with a 1 day history of confusion.  According to wife, patient has been standing around the house, forgetting what he is doing, being slow to respond  Labs unremarkable and included CBC, CMP, troponin, procalcitonin and urinalysis. An EKG, personally viewed and interpreted showed a flutter at 61 with 4-1 block. Chest x-ray showed interstitial opacities right lower lung suspicious for infection or aspiration CT head no acute intracranial abnormality.  Stable left frontoparietal deep brain stimulator.   Acute confusion Mild cognitive impairment (per past documentation) Etiology uncertain.  Neuroexam is nonfocal.  CT head negative no stigmata of infection except for abnormality on chest x-ray. Question whether related to underlying cognitive impairment Continue neurologic checks-- patient  did not have any neural deficit. He ambulated around the nurses station and on his own in the room without difficulty. Fall and aspiration precautions Unable to get MRI due to deep brain stimulator (patient apparently scheduled for MRI at [facility] next month) Carotid Doppler -- no acute abnormalityBilateral calcified cervical carotid atherosclerosis, but no Doppler Ultrasound evidence of hemodynamically significant  stenosis. Mentation back to baseline. Wife confirms.   Abnormal x-ray-- right lower lobe interstitial opacities -- patient did not have clinical signs symptoms of pneumonia. No chronic cough. Discontinued antibiotic. Patient remains afebrile white count normal.   A fib with slow ventricular response -- continue Xarelto for stroke prevention   S/P deep brain stimulator placement for essential tremor 2006 at [facility] Last follow-up at [facility] was 2021 No acute issues suspected   Hypertension resume home meds -- heart rate remained 59 to 69 he was asymptomatic. No dizziness. Blood pressure remained stable "  Diagnostic Tests Reviewed: CLINICAL DATA:  89 year old male with altered mental status.   EXAM: BILATERAL CAROTID DUPLEX ULTRASOUND   TECHNIQUE: Wallace Cullens scale imaging, color Doppler and duplex ultrasound were performed of bilateral carotid and vertebral arteries in the neck.   COMPARISON:  Head CT today reported separately. Cervical spine CT 02/20/2023. Prior carotid Doppler ultrasound 10/31/2019.   FINDINGS: Criteria: Quantification of carotid stenosis is based on velocity parameters that correlate the residual internal carotid diameter with NASCET-based stenosis levels, using the diameter of the distal internal carotid lumen as the denominator for stenosis measurement.   The following velocity measurements were obtained:   RIGHT   ICA: 66 cm/sec   CCA: 58 cm/sec   SYSTOLIC ICA/CCA RATIO:  1.2   ECA: 75 cm/sec   LEFT   ICA: 49 cm/sec   CCA: 74 cm/sec   SYSTOLIC ICA/CCA RATIO:  Less than 1   ECA: 63 cm/sec   RIGHT CAROTID ARTERY: Calcified atherosclerosis, also demonstrated on cervical spine CT in April.   RIGHT VERTEBRAL ARTERY: Patent with antegrade flow detected on image 32.   LEFT CAROTID ARTERY: Calcified atherosclerosis, also demonstrated on cervical spine CT.   LEFT VERTEBRAL ARTERY: Patent with antegrade flow detected on image 63.    IMPRESSION: Bilateral calcified cervical carotid atherosclerosis, but no Doppler Ultrasound evidence of hemodynamically significant stenosis.  CLINICAL DATA:  Confused all day.   EXAM: PORTABLE CHEST 1 VIEW   COMPARISON:  Radiographs 02/20/2023   FINDINGS: Stable cardiomediastinal silhouette. Aortic atherosclerotic calcification. Interstitial opacities in the right lower lung suspicious for infection or aspiration. No pleural effusion pneumothorax. Left chest wall neurostimulator.   IMPRESSION: Interstitial opacities in the right lower lung suspicious for infection or aspiration.  CLINICAL DATA:  Mental status change   EXAM: CT HEAD WITHOUT CONTRAST   TECHNIQUE: Contiguous axial images were obtained from the base of the skull through the vertex without intravenous contrast.   RADIATION DOSE REDUCTION: This exam was performed according to the departmental dose-optimization program which includes automated exposure control, adjustment of the mA and/or kV according to patient size and/or use of iterative reconstruction technique.   COMPARISON:  CT head 02/20/2023   FINDINGS: Brain: No intracranial hemorrhage, mass effect, or evidence of acute infarct. No hydrocephalus. No extra-axial fluid collection. Generalized cerebral atrophy. Ill-defined hypoattenuation within the cerebral white matter is nonspecific but consistent with chronic small vessel ischemic disease. Stable left frontal parietal deep brain stimulator device.   Vascular: No hyperdense vessel. Intracranial arterial calcification.   Skull: No fracture or focal lesion.  Sinuses/Orbits: No acute finding. Paranasal sinuses and mastoid air cells are well aerated.   Other: None.   IMPRESSION: 1. No acute intracranial abnormality. 2. Stable left frontoparietal deep brain stimulator.  Disposition: Home  Consults: None  Discharge Instructions:Follow up here  Disease/illness Education: Discussed  today  Home Health/Community Services Discussions/Referrals: In place  Establishment or re-establishment of referral orders for community resources: In place  Discussion with other health care providers: N/A  Assessment and Support of treatment regimen adherence: Fair  Appointments Coordinated with: Patient and wife  Education for self-management, independent living, and ADLs:  Discussed today  Since getting out of the hospital, Lacey has been feeing well. He denies any SOB or chest pain. He has not had any confusion since he got out of the hospital. He was unable to have his MRI as all the wires were not attached appropriately. It has not turned on since 2022. He was advised to follow up with his neurosurgeon to discuss having it removed to have MRI. He is otherwise doing well with no other concerns or complaints at this time.   Relevant past medical, surgical, family and social history reviewed and updated as  indicated. Interim medical history since our last visit reviewed. Allergies and medications reviewed and updated.  Review of Systems  Constitutional: Negative.   Respiratory: Negative.    Cardiovascular: Negative.   Gastrointestinal: Negative.   Musculoskeletal: Negative.   Psychiatric/Behavioral:  Positive for confusion. Negative for agitation, behavioral problems, decreased concentration, dysphoric mood, hallucinations, self-injury, sleep disturbance and suicidal ideas. The patient is not nervous/anxious and is not hyperactive.     Per HPI unless specifically indicated above     Objective:    BP (!) 158/66 (BP Location: Left Arm, Cuff Size: Normal)   Pulse 62   Temp 97.7 F (36.5 C) (Oral)   Ht 5\' 9"  (1.753 m)   Wt 163 lb 9.6 oz (74.2 kg)   SpO2 (!) 86%   BMI 24.16 kg/m   Wt Readings from Last 3 Encounters:  05/13/23 163 lb 9.6 oz (74.2 kg)  05/10/23 162 lb 14.7 oz (73.9 kg)  04/05/23 164 lb (74.4 kg)    Physical Exam Vitals and nursing note reviewed.   Constitutional:      General: He is not in acute distress.    Appearance: Normal appearance. He is normal weight. He is not ill-appearing, toxic-appearing or diaphoretic.  HENT:     Head: Normocephalic and atraumatic.     Right Ear: External ear normal.     Left Ear: External ear normal.     Nose: Nose normal.     Mouth/Throat:     Mouth: Mucous membranes are moist.     Pharynx: Oropharynx is clear.  Eyes:     General: No scleral icterus.       Right eye: No discharge.        Left eye: No discharge.     Extraocular Movements: Extraocular movements intact.     Conjunctiva/sclera: Conjunctivae normal.     Pupils: Pupils are equal, round, and reactive to light.  Cardiovascular:     Rate and Rhythm: Normal rate and regular rhythm.     Pulses: Normal pulses.     Heart sounds: Normal heart sounds. No murmur heard.    No friction rub. No gallop.  Pulmonary:     Effort: Pulmonary effort is normal. No respiratory distress.     Breath sounds: Normal breath sounds. No stridor. No wheezing, rhonchi or rales.  Chest:     Chest wall: No tenderness.  Musculoskeletal:        General: Normal range of motion.     Cervical back: Normal range of motion and neck supple.  Skin:    General: Skin is warm and dry.     Capillary Refill: Capillary refill takes less than 2 seconds.     Coloration: Skin is not jaundiced or pale.     Findings: No bruising, erythema, lesion or rash.  Neurological:     General: No focal deficit present.     Mental Status: He is alert and oriented to person, place, and time. Mental status is at baseline.  Psychiatric:        Mood and Affect: Mood normal.        Behavior: Behavior normal.        Thought Content: Thought content normal.        Judgment: Judgment normal.     Results for orders placed or performed during the hospital encounter of 05/10/23  Blood culture (single)   Specimen: BLOOD  Result Value Ref Range   Specimen Description BLOOD HAND    Special  Requests      BOTTLES DRAWN AEROBIC AND ANAEROBIC Blood Culture adequate volume   Culture      NO GROWTH 3 DAYS Performed at [facility], [address] [place], [place] [address]    Report Status PENDING   CBC with Differential  Result Value Ref Range   WBC 10.0 4.0 - 10.5 K/uL   RBC 4.61 4.22 - 5.81 MIL/uL   Hemoglobin 14.1 13.0 - 17.0 g/dL   HCT 60.4 54.0 - 98.1 %   MCV 92.4 80.0 - 100.0 fL   MCH 30.6 26.0 - 34.0 pg   MCHC 33.1 30.0 - 36.0 g/dL   RDW 19.1 47.8 - 29.5 %   Platelets 204 150 - 400 K/uL   nRBC 0.0 0.0 - 0.2 %   Neutrophils Relative % 54 %   Neutro Abs 5.5 1.7 - 7.7 K/uL   Lymphocytes Relative 35 %   Lymphs Abs 3.5 0.7 - 4.0 K/uL   Monocytes Relative 8 %   Monocytes Absolute 0.8 0.1 - 1.0 K/uL   Eosinophils Relative 2 %   Eosinophils Absolute 0.2 0.0 - 0.5 K/uL   Basophils Relative 1 %   Basophils Absolute 0.1 0.0 - 0.1 K/uL   Immature Granulocytes 0 %   Abs Immature Granulocytes 0.03 0.00 - 0.07 K/uL  Comprehensive metabolic panel  Result Value Ref Range   Sodium 138 135 - 145 mmol/L   Potassium 3.7 3.5 - 5.1 mmol/L   Chloride 104 98 - 111 mmol/L   CO2 26 22 - 32 mmol/L   Glucose, Bld 120 (H) 70 - 99 mg/dL   BUN 12 8 - 23 mg/dL   Creatinine, Ser 6.21 0.61 - 1.24 mg/dL   Calcium 8.5 (L) 8.9 - 10.3 mg/dL   Total Protein 6.6 6.5 - 8.1 g/dL   Albumin 3.7 3.5 - 5.0 g/dL   AST 29 15 - 41 U/L   ALT 21 0 - 44 U/L   Alkaline Phosphatase 63 38 - 126 U/L   Total Bilirubin 0.6 0.3 - 1.2 mg/dL   GFR, Estimated >30 >86 mL/min   Anion gap 8 5 - 15  Urinalysis, Routine w reflex microscopic -Urine, Clean Catch  Result Value Ref Range   Color, Urine YELLOW (A) YELLOW   APPearance CLEAR (A) CLEAR   Specific  Gravity, Urine 1.011 1.005 - 1.030   pH 6.0 5.0 - 8.0   Glucose, UA NEGATIVE NEGATIVE mg/dL   Hgb urine dipstick NEGATIVE NEGATIVE   Bilirubin Urine NEGATIVE NEGATIVE   Ketones, ur NEGATIVE NEGATIVE mg/dL   Protein, ur NEGATIVE NEGATIVE mg/dL    Nitrite NEGATIVE NEGATIVE   Leukocytes,Ua NEGATIVE NEGATIVE  Procalcitonin  Result Value Ref Range   Procalcitonin <0.10 ng/mL  Blood gas, venous  Result Value Ref Range   pH, Ven 7.4 7.25 - 7.43   pCO2, Ven 38 (L) 44 - 60 mmHg   pO2, Ven 41 32 - 45 mmHg   Bicarbonate 23.5 20.0 - 28.0 mmol/L   Acid-base deficit 1.1 0.0 - 2.0 mmol/L   O2 Saturation 72.4 %   Patient temperature 37.0    Collection site VEIN   CBG monitoring, ED  Result Value Ref Range   Glucose-Capillary 117 (H) 70 - 99 mg/dL  Troponin I (High Sensitivity)  Result Value Ref Range   Troponin I (High Sensitivity) 9 <18 ng/L      Assessment & Plan:   Problem List Items Addressed This Visit       Other   Acute confusion - Primary    Resolved. Feeling 100% better. Of unclear etiology. Call with any concerns. Follow up with neurology and neurosurgery.       S/P deep brain stimulator placement for essential tremor 2006 at [facility]    Unable to have MRI without deep brain stimulator removed. Will get him into neurosurgery to discuss whether it is a good option. Await their input.       Relevant Orders   Ambulatory referral to Neurosurgery   Abnormal chest x-ray - RLL    Lungs clear today. Hold on medication. Continue to monitor.         Follow up plan: Return in about 6 weeks (around 06/24/2023).

## 2023-05-13 NOTE — Assessment & Plan Note (Signed)
Unable to have MRI without deep brain stimulator removed. Will get him into neurosurgery to discuss whether it is a good option. Await their input.

## 2023-05-13 NOTE — Assessment & Plan Note (Signed)
Resolved. Feeling 100% better. Of unclear etiology. Call with any concerns. Follow up with neurology and neurosurgery.

## 2023-05-13 NOTE — Assessment & Plan Note (Signed)
Lungs clear today. Hold on medication. Continue to monitor.

## 2023-05-15 LAB — CULTURE, BLOOD (SINGLE): Special Requests: ADEQUATE

## 2023-05-23 ENCOUNTER — Telehealth: Payer: Self-pay | Admitting: Family Medicine

## 2023-05-23 DIAGNOSIS — J449 Chronic obstructive pulmonary disease, unspecified: Secondary | ICD-10-CM | POA: Diagnosis not present

## 2023-05-23 NOTE — Telephone Encounter (Signed)
Patient called [facility] to see if he could reschedule his B12 appointment to a earlier date due to being out of town from 06-08-23 to 06-20-23. Informed [facility] that I would send a message to provider and we will follow up with patient in 48-72 hours. Will route message to Dr. Laural Benes and CMA. Please advise.

## 2023-05-23 NOTE — Telephone Encounter (Signed)
Copied from CRM [id number]. Topic: General - Inquiry >> May 23, 2023  9:17 AM Patsy Lager T wrote: Reason for CRM: patient spouse called stated they are going out of town on the 24th of July and need to see if patient can get his B-12 shot before they leave. He had his last one on 6/28. Please f/u with patient to reschedule.

## 2023-05-24 DIAGNOSIS — J449 Chronic obstructive pulmonary disease, unspecified: Secondary | ICD-10-CM | POA: Diagnosis not present

## 2023-05-24 NOTE — Telephone Encounter (Signed)
Appointment has been made

## 2023-06-02 DIAGNOSIS — Z9689 Presence of other specified functional implants: Secondary | ICD-10-CM | POA: Diagnosis not present

## 2023-06-03 ENCOUNTER — Other Ambulatory Visit: Payer: Self-pay | Admitting: Family Medicine

## 2023-06-03 ENCOUNTER — Other Ambulatory Visit: Payer: Self-pay | Admitting: Cardiovascular Disease

## 2023-06-03 ENCOUNTER — Telehealth: Payer: Self-pay | Admitting: Family Medicine

## 2023-06-03 NOTE — Telephone Encounter (Signed)
Dr. Lynwood Dawley calling from [facility] is calling to speak to Dr. Laury Deep. [phone]

## 2023-06-06 ENCOUNTER — Ambulatory Visit (INDEPENDENT_AMBULATORY_CARE_PROVIDER_SITE_OTHER): Payer: Medicare Other

## 2023-06-06 DIAGNOSIS — E538 Deficiency of other specified B group vitamins: Secondary | ICD-10-CM

## 2023-06-06 NOTE — Telephone Encounter (Signed)
Requested Prescriptions  Pending Prescriptions Disp Refills   losartan (COZAAR) 50 MG tablet [Pharmacy Med Name: LOSARTAN POTASSIUM 50 MG TAB] 90 tablet 1    Sig: Take 1 tablet (50 mg total) by mouth daily.     Cardiovascular:  Angiotensin Receptor Blockers Failed - 06/03/2023  5:20 PM      Failed - Last BP in normal range    BP Readings from Last 1 Encounters:  05/13/23 (!) 158/66         Passed - Cr in normal range and within 180 days    Creatinine  Date Value Ref Range Status  11/13/2013 1.72 (H) 0.60 - 1.30 mg/dL Final   Creatinine, Ser  Date Value Ref Range Status  05/10/2023 1.04 0.61 - 1.24 mg/dL Final         Passed - K in normal range and within 180 days    Potassium  Date Value Ref Range Status  05/10/2023 3.7 3.5 - 5.1 mmol/L Final  11/13/2013 3.6 3.5 - 5.1 mmol/L Final         Passed - Patient is not pregnant      Passed - Valid encounter within last 6 months    Recent Outpatient Visits           3 weeks ago Acute confusion   [place] [facility] [place], [place], DO   2 months ago Confusion   [place] [facility] [place], Megan P, DO   3 months ago Syncope, unspecified syncope type   [place] [facility] [place], Megan P, DO   3 months ago Mild cognitive impairment   [place] [facility] [place], Sherran Needs, NP   5 months ago Benign hypertensive renal disease   [place] [facility] Dorcas Carrow, DO       Future Appointments             In 3 weeks Dorcas Carrow, DO [facility], [facility]   In 1 month Laurier Nancy, MD [facility]

## 2023-06-06 NOTE — Progress Notes (Signed)
Patient presents today for B-12 injection, patient received in left  deltoid, patient tolerated well.

## 2023-06-10 NOTE — Telephone Encounter (Signed)
Called and LMOM on machine.

## 2023-06-13 ENCOUNTER — Ambulatory Visit: Payer: Medicare Other

## 2023-06-13 NOTE — Telephone Encounter (Signed)
Spoke to Dr. Lynwood Dawley. He does not recommend deep brain stimulator removal and does not feel the MRI would show anything that warrented its removal

## 2023-06-14 DIAGNOSIS — T18128A Food in esophagus causing other injury, initial encounter: Secondary | ICD-10-CM | POA: Diagnosis not present

## 2023-06-17 ENCOUNTER — Ambulatory Visit: Payer: Medicare Other | Admitting: Family Medicine

## 2023-06-20 ENCOUNTER — Telehealth: Payer: Self-pay

## 2023-06-20 NOTE — Transitions of Care (Post Inpatient/ED Visit) (Signed)
06/20/2023  Name: Carlos Richard MRN: [id number] DOB: [birth year]  Today's TOC FU Call Status: Today's TOC FU Call Status:: Successful TOC FU Call Completed TOC FU Call Complete Date: 06/20/23  Transition Care Management Follow-up Telephone Call Date of Discharge: 06/14/23 Discharge Facility[facility]) Type of Discharge: Emergency Department Reason for ED Visit: Other: How have you been since you were released from the hospital?: Better Any questions or concerns?: No  Items Reviewed: Did you receive and understand the discharge instructions provided?: Yes Medications obtained,verified, and reconciled?: Yes (Medications Reviewed) Any new allergies since your discharge?: No Dietary orders reviewed?: No Do you have support at home?: No People in Home: spouse  Medications Reviewed Today: Medications Reviewed Today     Reviewed by Pablo Ledger, CMA (Certified Medical Assistant) on 06/20/23 at [phone]  Med List Status: <None>   Medication Order Taking? Sig Documenting Provider Last Dose Status Informant  allopurinol (ZYLOPRIM) 100 MG tablet [id number] Yes Take 1 tablet (100 mg total) by mouth daily. Monday, Wednesday, Friday Dorcas Carrow, DO Taking Active Spouse/Significant Other  cyanocobalamin (VITAMIN B12) injection 1,000 mcg [id number]   Dorcas Carrow, DO  Active   fluticasone-salmeterol (ADVAIR DISKUS) 250-50 MCG/ACT AEPB [id number] Yes INHALE 1 PUFF INTO THE LUNGS TWICE A DAY Johnson, Megan P, DO Taking Active Spouse/Significant Other  furosemide (LASIX) 20 MG tablet [id number] Yes TAKE 1 TABLET DAILY.  Patient taking differently: Take 20 mg by mouth daily. Monday, Wednesday, Friday   Laurier Nancy, MD Taking Active Spouse/Significant Other  losartan (COZAAR) 50 MG tablet [id number] Yes Take 1 tablet (50 mg total) by mouth daily. Olevia Perches P, DO Taking Active   magnesium oxide (MAG-OX) 400 MG tablet [id number] Yes Take 400 mg by mouth  daily. [provider] Taking Active Spouse/Significant Other  metoprolol succinate (TOPROL-XL) 50 MG 24 hr tablet [id number] Yes TAKE 1 TABLET DAILY. Laurier Nancy, MD Taking Active   Multiple Vitamins-Minerals (ZINC PO) [id number] Yes Take 1 tablet by mouth daily. [provider] Taking Active Spouse/Significant Other  simvastatin (ZOCOR) 20 MG tablet [id number] Yes Take 1 tablet (20 mg total) by mouth daily. Olevia Perches P, DO Taking Active Spouse/Significant Other  triamcinolone cream (KENALOG) 0.1 % [id number] Yes Apply 1 Application topically 2 (two) times daily. [provider] Taking Active Spouse/Significant Other  XARELTO 15 MG TABS tablet [id number] Yes Take 1 tablet (15 mg total) by mouth daily with supper. Olevia Perches P, DO Taking Active Spouse/Significant Other  Zinc 50 MG TABS [id number] Yes Take 50 mg by mouth daily.  [provider] Taking Active Spouse/Significant Other  Med List Note Christen Bame 07/03/15 3151): Patient also takes Cherry Juice for gout.             Home Care and Equipment/Supplies: Were Home Health Services Ordered?: No Any new equipment or medical supplies ordered?: No  Functional Questionnaire: Do you need assistance with bathing/showering or dressing?: No Do you need assistance with meal preparation?: No Do you need assistance with eating?: No Do you have difficulty maintaining continence: No Do you need assistance with getting out of bed/getting out of a chair/moving?: No Do you have difficulty managing or taking your medications?: No  Follow up appointments reviewed: PCP Follow-up appointment confirmed?: No (Patient's wife states that the patient is fine and has an appointment 07/01/23 with Dr. Laural Benes. States she does not want to reschedule for an earlier appointment.) MD Provider Line Number:[phone] Given: No  Specialist Hospital Follow-up appointment confirmed?: No Do you need transportation  to your follow-up appointment?: No Do you understand care options if your condition(s) worsen?: Yes-patient verbalized understanding    SIGNATURE: Wilhemena Durie, CMA

## 2023-06-23 DIAGNOSIS — J449 Chronic obstructive pulmonary disease, unspecified: Secondary | ICD-10-CM | POA: Diagnosis not present

## 2023-06-24 DIAGNOSIS — J449 Chronic obstructive pulmonary disease, unspecified: Secondary | ICD-10-CM | POA: Diagnosis not present

## 2023-07-01 ENCOUNTER — Ambulatory Visit (INDEPENDENT_AMBULATORY_CARE_PROVIDER_SITE_OTHER): Payer: Medicare Other | Admitting: Family Medicine

## 2023-07-01 ENCOUNTER — Encounter: Payer: Self-pay | Admitting: Family Medicine

## 2023-07-01 VITALS — BP 172/72 | HR 61 | Temp 97.7°F | Wt 162.4 lb

## 2023-07-01 DIAGNOSIS — M1A9XX1 Chronic gout, unspecified, with tophus (tophi): Secondary | ICD-10-CM

## 2023-07-01 DIAGNOSIS — E538 Deficiency of other specified B group vitamins: Secondary | ICD-10-CM | POA: Diagnosis not present

## 2023-07-01 DIAGNOSIS — E782 Mixed hyperlipidemia: Secondary | ICD-10-CM | POA: Diagnosis not present

## 2023-07-01 DIAGNOSIS — I10 Essential (primary) hypertension: Secondary | ICD-10-CM | POA: Diagnosis not present

## 2023-07-01 DIAGNOSIS — I482 Chronic atrial fibrillation, unspecified: Secondary | ICD-10-CM | POA: Diagnosis not present

## 2023-07-01 MED ORDER — SIMVASTATIN 20 MG PO TABS: 20.0000 mg | ORAL_TABLET | Freq: Every day | ORAL | 1 refills | Status: DC

## 2023-07-01 MED ORDER — ALLOPURINOL 100 MG PO TABS: 100.0000 mg | ORAL_TABLET | Freq: Every day | ORAL | 1 refills | Status: DC

## 2023-07-01 MED ORDER — METOPROLOL SUCCINATE ER 50 MG PO TB24: 50.0000 mg | ORAL_TABLET | Freq: Every day | ORAL | 0 refills | Status: DC

## 2023-07-01 MED ORDER — LOSARTAN POTASSIUM 50 MG PO TABS: 75.0000 mg | ORAL_TABLET | Freq: Every day | ORAL | 0 refills | Status: DC

## 2023-07-01 MED ORDER — FLUTICASONE-SALMETEROL 250-50 MCG/ACT IN AEPB: INHALATION_SPRAY | RESPIRATORY_TRACT | 1 refills | Status: DC

## 2023-07-01 NOTE — Assessment & Plan Note (Signed)
Under good control on current regimen. Continue current regimen. Continue to monitor. Call with any concerns. Refills given. Labs drawn today.   

## 2023-07-01 NOTE — Progress Notes (Signed)
BP (!) 172/72   Pulse 61   Temp 97.7 F (36.5 C) (Oral)   Wt 162 lb 6.4 oz (73.7 kg)   SpO2 94%   BMI 23.98 kg/m    Subjective:    Patient ID: Carlos Richard, male    DOB: May 16, 1932, 89 y.o.   MRN: [id number]  HPI: Carlos Richard is a 89 y.o. male  Chief Complaint  Patient presents with   Hyperlipidemia   Hypertension   HYPERTENSION / HYPERLIPIDEMIA Satisfied with current treatment? yes Duration of hypertension: chronic BP monitoring frequency: rarely BP medication side effects: no Past BP meds: losartan, lasix, metoprolol Duration of hyperlipidemia: chronic Cholesterol medication side effects: no Cholesterol supplements: none Past cholesterol medications: simvastatin Medication compliance: excellent compliance Aspirin: no Recent stressors: no Recurrent headaches: no Visual changes: no Palpitations: no Dyspnea: no Chest pain: no Lower extremity edema: no Dizzy/lightheaded: no  No gout flares. Tolerating his medicine well. Feeling well.   Relevant past medical, surgical, family and social history reviewed and updated as indicated. Interim medical history since our last visit reviewed. Allergies and medications reviewed and updated.  Review of Systems  Constitutional: Negative.   Respiratory: Negative.    Cardiovascular: Negative.   Gastrointestinal: Negative.   Musculoskeletal: Negative.   Neurological: Negative.   Psychiatric/Behavioral: Negative.      Per HPI unless specifically indicated above     Objective:    BP (!) 172/72   Pulse 61   Temp 97.7 F (36.5 C) (Oral)   Wt 162 lb 6.4 oz (73.7 kg)   SpO2 94%   BMI 23.98 kg/m   Wt Readings from Last 3 Encounters:  07/01/23 162 lb 6.4 oz (73.7 kg)  05/13/23 163 lb 9.6 oz (74.2 kg)  05/10/23 162 lb 14.7 oz (73.9 kg)    Physical Exam Vitals and nursing note reviewed.  Constitutional:      General: He is not in acute distress.    Appearance: Normal appearance. He is normal weight. He is  not ill-appearing, toxic-appearing or diaphoretic.  HENT:     Head: Normocephalic and atraumatic.     Right Ear: External ear normal.     Left Ear: External ear normal.     Nose: Nose normal.     Mouth/Throat:     Mouth: Mucous membranes are moist.     Pharynx: Oropharynx is clear.  Eyes:     General: No scleral icterus.       Right eye: No discharge.        Left eye: No discharge.     Extraocular Movements: Extraocular movements intact.     Conjunctiva/sclera: Conjunctivae normal.     Pupils: Pupils are equal, round, and reactive to light.  Cardiovascular:     Rate and Rhythm: Normal rate and regular rhythm.     Pulses: Normal pulses.     Heart sounds: Normal heart sounds. No murmur heard.    No friction rub. No gallop.  Pulmonary:     Effort: Pulmonary effort is normal. No respiratory distress.     Breath sounds: Normal breath sounds. No stridor. No wheezing, rhonchi or rales.  Chest:     Chest wall: No tenderness.  Musculoskeletal:        General: Normal range of motion.     Cervical back: Normal range of motion and neck supple.  Skin:    General: Skin is warm and dry.     Capillary Refill: Capillary refill takes less than 2 seconds.  Coloration: Skin is not jaundiced or pale.     Findings: No bruising, erythema, lesion or rash.  Neurological:     General: No focal deficit present.     Mental Status: He is alert and oriented to person, place, and time. Mental status is at baseline.  Psychiatric:        Mood and Affect: Mood normal.        Behavior: Behavior normal.        Thought Content: Thought content normal.        Judgment: Judgment normal.     Results for orders placed or performed during the hospital encounter of 05/10/23  Blood culture (single)   Specimen: BLOOD  Result Value Ref Range   Specimen Description BLOOD HAND    Special Requests      BOTTLES DRAWN AEROBIC AND ANAEROBIC Blood Culture adequate volume   Culture      NO GROWTH 5 DAYS Performed  at [facility], [address] [place], [place] [address]    Report Status 05/15/2023 FINAL   CBC with Differential  Result Value Ref Range   WBC 10.0 4.0 - 10.5 K/uL   RBC 4.61 4.22 - 5.81 MIL/uL   Hemoglobin 14.1 13.0 - 17.0 g/dL   HCT 24.4 01.0 - 27.2 %   MCV 92.4 80.0 - 100.0 fL   MCH 30.6 26.0 - 34.0 pg   MCHC 33.1 30.0 - 36.0 g/dL   RDW 53.6 64.4 - 03.4 %   Platelets 204 150 - 400 K/uL   nRBC 0.0 0.0 - 0.2 %   Neutrophils Relative % 54 %   Neutro Abs 5.5 1.7 - 7.7 K/uL   Lymphocytes Relative 35 %   Lymphs Abs 3.5 0.7 - 4.0 K/uL   Monocytes Relative 8 %   Monocytes Absolute 0.8 0.1 - 1.0 K/uL   Eosinophils Relative 2 %   Eosinophils Absolute 0.2 0.0 - 0.5 K/uL   Basophils Relative 1 %   Basophils Absolute 0.1 0.0 - 0.1 K/uL   Immature Granulocytes 0 %   Abs Immature Granulocytes 0.03 0.00 - 0.07 K/uL  Comprehensive metabolic panel  Result Value Ref Range   Sodium 138 135 - 145 mmol/L   Potassium 3.7 3.5 - 5.1 mmol/L   Chloride 104 98 - 111 mmol/L   CO2 26 22 - 32 mmol/L   Glucose, Bld 120 (H) 70 - 99 mg/dL   BUN 12 8 - 23 mg/dL   Creatinine, Ser 7.42 0.61 - 1.24 mg/dL   Calcium 8.5 (L) 8.9 - 10.3 mg/dL   Total Protein 6.6 6.5 - 8.1 g/dL   Albumin 3.7 3.5 - 5.0 g/dL   AST 29 15 - 41 U/L   ALT 21 0 - 44 U/L   Alkaline Phosphatase 63 38 - 126 U/L   Total Bilirubin 0.6 0.3 - 1.2 mg/dL   GFR, Estimated >59 >56 mL/min   Anion gap 8 5 - 15  Urinalysis, Routine w reflex microscopic -Urine, Clean Catch  Result Value Ref Range   Color, Urine YELLOW (A) YELLOW   APPearance CLEAR (A) CLEAR   Specific Gravity, Urine 1.011 1.005 - 1.030   pH 6.0 5.0 - 8.0   Glucose, UA NEGATIVE NEGATIVE mg/dL   Hgb urine dipstick NEGATIVE NEGATIVE   Bilirubin Urine NEGATIVE NEGATIVE   Ketones, ur NEGATIVE NEGATIVE mg/dL   Protein, ur NEGATIVE NEGATIVE mg/dL   Nitrite NEGATIVE NEGATIVE   Leukocytes,Ua NEGATIVE NEGATIVE  Procalcitonin  Result Value Ref  Range    Procalcitonin <0.10 ng/mL  Blood gas, venous  Result Value Ref Range   pH, Ven 7.4 7.25 - 7.43   pCO2, Ven 38 (L) 44 - 60 mmHg   pO2, Ven 41 32 - 45 mmHg   Bicarbonate 23.5 20.0 - 28.0 mmol/L   Acid-base deficit 1.1 0.0 - 2.0 mmol/L   O2 Saturation 72.4 %   Patient temperature 37.0    Collection site VEIN   CBG monitoring, ED  Result Value Ref Range   Glucose-Capillary 117 (H) 70 - 99 mg/dL  Troponin I (High Sensitivity)  Result Value Ref Range   Troponin I (High Sensitivity) 9 <18 ng/L      Assessment & Plan:   Problem List Items Addressed This Visit       Cardiovascular and Mediastinum   HTN (hypertension)    BP running high. Will increase his losartan to 75mg  and recheck in 1 month. Call with any concerns. Labs drawn today.       Relevant Medications   losartan (COZAAR) 50 MG tablet   metoprolol succinate (TOPROL-XL) 50 MG 24 hr tablet   simvastatin (ZOCOR) 20 MG tablet   Other Relevant Orders   CBC with Differential/Platelet   Comprehensive metabolic panel   Atrial fibrillation, chronic (HCC) - Primary    Having trouble with the cost- will get pharmacy involved to see about options. Call with any concerns. Continue to monitor.       Relevant Medications   losartan (COZAAR) 50 MG tablet   metoprolol succinate (TOPROL-XL) 50 MG 24 hr tablet   simvastatin (ZOCOR) 20 MG tablet   Other Relevant Orders   AMB Referral to Pharmacy Medication Management     Other   Gout    Under good control on current regimen. Continue current regimen. Continue to monitor. Call with any concerns. Refills given. Labs drawn today.        Relevant Orders   Uric acid   HLD (hyperlipidemia)    Under good control on current regimen. Continue current regimen. Continue to monitor. Call with any concerns. Refills given. Labs drawn today.       Relevant Medications   losartan (COZAAR) 50 MG tablet   metoprolol succinate (TOPROL-XL) 50 MG 24 hr tablet   simvastatin (ZOCOR) 20 MG tablet    Other Relevant Orders   CBC with Differential/Platelet   Comprehensive metabolic panel   Lipid Panel w/o Chol/HDL Ratio   B12 deficiency    B12 shot given today. Call with any concerns.        Follow up plan: Return in about 4 weeks (around 07/29/2023).

## 2023-07-01 NOTE — Assessment & Plan Note (Signed)
B12 shot given today. Call with any concerns.  

## 2023-07-01 NOTE — Assessment & Plan Note (Signed)
Having trouble with the cost- will get pharmacy involved to see about options. Call with any concerns. Continue to monitor.

## 2023-07-01 NOTE — Assessment & Plan Note (Signed)
BP running high. Will increase his losartan to 75mg  and recheck in 1 month. Call with any concerns. Labs drawn today.

## 2023-07-02 LAB — COMPREHENSIVE METABOLIC PANEL
ALT: 24 IU/L (ref 0–44)
AST: 28 IU/L (ref 0–40)
Albumin: 4.3 g/dL (ref 3.7–4.7)
Alkaline Phosphatase: 81 IU/L (ref 44–121)
BUN/Creatinine Ratio: 8 — ABNORMAL LOW (ref 10–24)
BUN: 10 mg/dL (ref 8–27)
Bilirubin Total: 0.5 mg/dL (ref 0.0–1.2)
CO2: 27 mmol/L (ref 20–29)
Calcium: 9.8 mg/dL (ref 8.6–10.2)
Chloride: 103 mmol/L (ref 96–106)
Creatinine, Ser: 1.26 mg/dL (ref 0.76–1.27)
Globulin, Total: 2.5 g/dL (ref 1.5–4.5)
Glucose: 96 mg/dL (ref 70–99)
Potassium: 4.9 mmol/L (ref 3.5–5.2)
Sodium: 143 mmol/L (ref 134–144)
Total Protein: 6.8 g/dL (ref 6.0–8.5)
eGFR: 55 mL/min/{1.73_m2} — ABNORMAL LOW (ref >59)

## 2023-07-02 LAB — CBC WITH DIFFERENTIAL/PLATELET
Basophils Absolute: 0.1 10*3/uL (ref 0.0–0.2)
Basos: 1 %
EOS (ABSOLUTE): 0.3 10*3/uL (ref 0.0–0.4)
Eos: 3 %
Hematocrit: 43.3 % (ref 37.5–51.0)
Hemoglobin: 14.8 g/dL (ref 13.0–17.7)
Immature Grans (Abs): 0 10*3/uL (ref 0.0–0.1)
Immature Granulocytes: 0 %
Lymphocytes Absolute: 3.5 10*3/uL — ABNORMAL HIGH (ref 0.7–3.1)
Lymphs: 35 %
MCH: 31.4 pg (ref 26.6–33.0)
MCHC: 34.2 g/dL (ref 31.5–35.7)
MCV: 92 fL (ref 79–97)
Monocytes Absolute: 0.8 10*3/uL (ref 0.1–0.9)
Monocytes: 8 %
Neutrophils Absolute: 5.3 10*3/uL (ref 1.4–7.0)
Neutrophils: 53 %
Platelets: 210 10*3/uL (ref 150–450)
RBC: 4.71 x10E6/uL (ref 4.14–5.80)
RDW: 12.6 % (ref 11.6–15.4)
WBC: 10 10*3/uL (ref 3.4–10.8)

## 2023-07-02 LAB — LIPID PANEL W/O CHOL/HDL RATIO
Cholesterol, Total: 143 mg/dL (ref 100–199)
HDL: 42 mg/dL (ref >39)
LDL Chol Calc (NIH): 76 mg/dL (ref 0–99)
Triglycerides: 143 mg/dL (ref 0–149)
VLDL Cholesterol Cal: 25 mg/dL (ref 5–40)

## 2023-07-02 LAB — URIC ACID: Uric Acid: 5.2 mg/dL (ref 3.8–8.4)

## 2023-07-04 ENCOUNTER — Telehealth: Payer: Self-pay | Admitting: Family Medicine

## 2023-07-04 ENCOUNTER — Telehealth: Payer: Self-pay

## 2023-07-04 NOTE — Progress Notes (Signed)
   Care Guide Note  07/04/2023 Name: KAMAAL RAYNOR MRN: [id number] DOB: 02-18-1932  Referred by: Dorcas Carrow, DO Reason for referral : Care Management (Outreach to schedule with pharm d )   CORION GUINAN is a 89 y.o. year old male who is a primary care patient of Dorcas Carrow, DO. JASDEEP MCKOWN was referred to the pharmacist for assistance related to Atrial Fibrillation.    Successful contact was made with the patient to discuss pharmacy services including being ready for the pharmacist to call at least 5 minutes before the scheduled appointment time, to have medication bottles and any blood sugar or blood pressure readings ready for review. The patient agreed to meet with the pharmacist via with the pharmacist via telephone visit on (date/time).  07/14/2023  Penne Lash, RMA Care Guide [facility]  [place], [place] [address] Direct Dial: [phone] [email or web address]

## 2023-07-04 NOTE — Telephone Encounter (Signed)
Called to inform patient's wife that we do not carry samples of Xarelto.  She verbalized understanding.

## 2023-07-04 NOTE — Telephone Encounter (Unsigned)
Copied from CRM [id number]. Topic: General - Inquiry >> Jul 04, 2023 12:26 PM Runell Gess P wrote: Reason for CRM: pt is asking for samples of xarelto 15 mg

## 2023-07-04 NOTE — Telephone Encounter (Signed)
We do not carry xarelto samples

## 2023-07-07 ENCOUNTER — Encounter: Payer: Self-pay | Admitting: Cardiovascular Disease

## 2023-07-07 ENCOUNTER — Ambulatory Visit: Payer: Medicare Other | Admitting: Cardiovascular Disease

## 2023-07-07 VITALS — BP 130/79 | HR 71 | Ht 69.0 in | Wt 162.8 lb

## 2023-07-07 DIAGNOSIS — I340 Nonrheumatic mitral (valve) insufficiency: Secondary | ICD-10-CM | POA: Diagnosis not present

## 2023-07-07 DIAGNOSIS — I10 Essential (primary) hypertension: Secondary | ICD-10-CM | POA: Diagnosis not present

## 2023-07-07 DIAGNOSIS — I351 Nonrheumatic aortic (valve) insufficiency: Secondary | ICD-10-CM

## 2023-07-07 DIAGNOSIS — I4891 Unspecified atrial fibrillation: Secondary | ICD-10-CM

## 2023-07-07 DIAGNOSIS — I129 Hypertensive chronic kidney disease with stage 1 through stage 4 chronic kidney disease, or unspecified chronic kidney disease: Secondary | ICD-10-CM

## 2023-07-07 DIAGNOSIS — E782 Mixed hyperlipidemia: Secondary | ICD-10-CM | POA: Diagnosis not present

## 2023-07-07 NOTE — Assessment & Plan Note (Signed)
stable °

## 2023-07-07 NOTE — Progress Notes (Signed)
Cardiology Office Note   Date:  07/07/2023   ID:  Akeem, Getter 12/06/1931, MRN [id number]  PCP:  Dorcas Carrow, DO  Cardiologist:  Adrian Blackwater, MD      History of Present Illness: Carlos Richard is a 89 y.o. male who presents for  Chief Complaint  Patient presents with   Follow-up    3 mo    Shortness of Breath This is a chronic problem. The current episode started more than 1 year ago. The problem has been waxing and waning. Pertinent negatives include no chest pain.      Past Medical History:  Diagnosis Date   Atrial fibrillation (HCC)    COPD (chronic obstructive pulmonary disease) (HCC)    Hypertension    Near syncope    Tremors of nervous system      Past Surgical History:  Procedure Laterality Date   BACK SURGERY     BRAIN SURGERY     stimulator placement for tremors   EYE SURGERY Left 2020   cataract surgery nov    EYE SURGERY Right 2020   cataract surgery dec     Current Outpatient Medications  Medication Sig Dispense Refill   allopurinol (ZYLOPRIM) 100 MG tablet Take 1 tablet (100 mg total) by mouth daily. Monday, Wednesday, Friday 90 tablet 1   fluticasone-salmeterol (ADVAIR DISKUS) 250-50 MCG/ACT AEPB INHALE 1 PUFF INTO THE LUNGS TWICE A DAY 180 each 1   furosemide (LASIX) 20 MG tablet TAKE 1 TABLET DAILY. (Patient taking differently: Take 20 mg by mouth daily. Monday, Wednesday, Friday) 90 tablet 0   losartan (COZAAR) 50 MG tablet Take 1.5 tablets (75 mg total) by mouth daily. 135 tablet 0   magnesium oxide (MAG-OX) 400 MG tablet Take 400 mg by mouth daily.     metoprolol succinate (TOPROL-XL) 50 MG 24 hr tablet Take 1 tablet (50 mg total) by mouth daily. 90 tablet 0   Multiple Vitamins-Minerals (ZINC PO) Take 1 tablet by mouth daily.     simvastatin (ZOCOR) 20 MG tablet Take 1 tablet (20 mg total) by mouth daily. 90 tablet 1   triamcinolone cream (KENALOG) 0.1 % Apply 1 Application topically 2 (two) times daily.     XARELTO 15  MG TABS tablet Take 1 tablet (15 mg total) by mouth daily with supper. 90 tablet 3   Zinc 50 MG TABS Take 50 mg by mouth daily.      Current Facility-Administered Medications  Medication Dose Route Frequency Provider Last Rate Last Admin   cyanocobalamin (VITAMIN B12) injection 1,000 mcg  1,000 mcg Intramuscular Q30 days Laural Benes, Megan P, DO   1,000 mcg at 07/01/23 1603    Allergies:   Patient has no known allergies.    Social History:   reports that he quit smoking about 44 years ago. His smoking use included cigarettes. He has never used smokeless tobacco. He reports current alcohol use. He reports that he does not use drugs.   Family History:  family history includes Obesity in his sister; Tremor in his paternal grandmother; Tuberculosis in his mother.    ROS:     Review of Systems  Constitutional: Negative.   HENT: Negative.    Eyes: Negative.   Respiratory:  Positive for shortness of breath.   Cardiovascular:  Negative for chest pain.  Gastrointestinal: Negative.   Genitourinary: Negative.   Musculoskeletal: Negative.   Skin: Negative.   Neurological: Negative.   Endo/Heme/Allergies: Negative.   Psychiatric/Behavioral: Negative.  All other systems reviewed and are negative.     All other systems are reviewed and negative.    PHYSICAL EXAM: VS:  BP 130/79   Pulse 71   Ht 5\' 9"  (1.753 m)   Wt 162 lb 12.8 oz (73.8 kg)   SpO2 91%   BMI 24.04 kg/m  , BMI Body mass index is 24.04 kg/m. Last weight:  Wt Readings from Last 3 Encounters:  07/07/23 162 lb 12.8 oz (73.8 kg)  07/01/23 162 lb 6.4 oz (73.7 kg)  05/13/23 163 lb 9.6 oz (74.2 kg)     Physical Exam Vitals reviewed.  Constitutional:      Appearance: Normal appearance. He is normal weight.  HENT:     Head: Normocephalic.     Nose: Nose normal.     Mouth/Throat:     Mouth: Mucous membranes are moist.  Eyes:     Pupils: Pupils are equal, round, and reactive to light.  Cardiovascular:     Rate and  Rhythm: Normal rate and regular rhythm.     Pulses: Normal pulses.     Heart sounds: Normal heart sounds.  Pulmonary:     Effort: Pulmonary effort is normal.  Abdominal:     General: Abdomen is flat. Bowel sounds are normal.  Musculoskeletal:        General: Normal range of motion.     Cervical back: Normal range of motion.  Skin:    General: Skin is warm.  Neurological:     General: No focal deficit present.     Mental Status: He is alert.  Psychiatric:        Mood and Affect: Mood normal.       EKG:   Recent Labs: 03/03/2023: TSH 1.280 07/01/2023: ALT 24; BUN 10; Creatinine, Ser 1.26; Hemoglobin 14.8; Platelets 210; Potassium 4.9; Sodium 143    Lipid Panel    Component Value Date/Time   CHOL 143 07/01/2023 1605   TRIG 143 07/01/2023 1605   HDL 42 07/01/2023 1605   LDLCALC 76 07/01/2023 1605      Other studies Reviewed: Additional studies/ records that were reviewed today include:  Review of the above records demonstrates:       No data to display            ASSESSMENT AND PLAN:    ICD-10-CM   1. Atrial fibrillation with slow ventricular response (HCC)  I48.91     2. Primary hypertension  I10     3. 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 Mixed hyperlipidemia  E78.2     5. Nonrheumatic mitral valve regurgitation  I34.0     6. 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       Problem List Items Addressed This Visit       Cardiovascular and Mediastinum   Atrial fibrillation with slow ventricular response (HCC) - Primary   HTN (hypertension)    stable        Genitourinary   Benign hypertensive renal disease     Other   Mixed hyperlipidemia   Other Visit Diagnoses     Nonrheumatic mitral valve regurgitation       Nonrheumatic aortic valve insufficiency       moderate to severe, agree going up to 75 mg losartan.No indication at this  age for AVR.  Disposition:   Return in about 3 months (around 10/07/2023).    Total time spent: 35 minutes  Signed,  Adrian Blackwater, MD  07/07/2023 9:11 AM    [facility]

## 2023-07-11 DIAGNOSIS — M25532 Pain in left wrist: Secondary | ICD-10-CM | POA: Diagnosis not present

## 2023-07-11 DIAGNOSIS — S62125A Nondisplaced fracture of lunate [semilunar], left wrist, initial encounter for closed fracture: Secondary | ICD-10-CM | POA: Diagnosis not present

## 2023-07-11 DIAGNOSIS — M1812 Unilateral primary osteoarthritis of first carpometacarpal joint, left hand: Secondary | ICD-10-CM | POA: Diagnosis not present

## 2023-07-11 DIAGNOSIS — Z7901 Long term (current) use of anticoagulants: Secondary | ICD-10-CM | POA: Diagnosis not present

## 2023-07-11 DIAGNOSIS — I10 Essential (primary) hypertension: Secondary | ICD-10-CM | POA: Diagnosis not present

## 2023-07-12 ENCOUNTER — Telehealth: Payer: Self-pay | Admitting: Family Medicine

## 2023-07-12 DIAGNOSIS — S62115A Nondisplaced fracture of triquetrum [cuneiform] bone, left wrist, initial encounter for closed fracture: Secondary | ICD-10-CM | POA: Diagnosis not present

## 2023-07-12 NOTE — Telephone Encounter (Signed)
Years supply of xarelto sent in in January- should have a refill at the pharmacy. Lasix is from his heart doctor.

## 2023-07-12 NOTE — Telephone Encounter (Signed)
Patient called and stated the he needs Rx sent to [facility] for Xarelto 15 mg and furosemide 20 mg Please advise.

## 2023-07-14 ENCOUNTER — Other Ambulatory Visit: Payer: Medicare Other

## 2023-07-14 NOTE — Progress Notes (Signed)
   07/14/2023  Patient ID: Carlos Richard, male   DOB: September 09, 1932, 89 y.o.   MRN: [id number]  S/O Patient encounter to assist with affordability of Xarelto  Medication Access/Adherence -Xarelto 15mg  daily is currently costing $225 every month, per patient/wife -Currently no patient assistance program available for this medication; but there is a Xarelto With Me program where patients can get the medication for $89/month or $250/90 days.  Patient/wife state this would be affordable for them.  A/P  Medication Access/Adherence -Provided the phone number for the Xarelto With me program as well as my direct phone number, so they can reach out for any additional/future medication needs  Lenna Gilford, PharmD, DPLA

## 2023-07-25 DIAGNOSIS — S62015A Nondisplaced fracture of distal pole of navicular [scaphoid] bone of left wrist, initial encounter for closed fracture: Secondary | ICD-10-CM | POA: Diagnosis not present

## 2023-08-05 ENCOUNTER — Ambulatory Visit (INDEPENDENT_AMBULATORY_CARE_PROVIDER_SITE_OTHER): Payer: Medicare Other

## 2023-08-05 DIAGNOSIS — Z23 Encounter for immunization: Secondary | ICD-10-CM

## 2023-08-10 DIAGNOSIS — S62015A Nondisplaced fracture of distal pole of navicular [scaphoid] bone of left wrist, initial encounter for closed fracture: Secondary | ICD-10-CM | POA: Diagnosis not present

## 2023-08-15 ENCOUNTER — Other Ambulatory Visit: Payer: Self-pay | Admitting: Family Medicine

## 2023-08-16 ENCOUNTER — Telehealth: Payer: Self-pay | Admitting: Family Medicine

## 2023-08-16 NOTE — Telephone Encounter (Signed)
Patient's wife Rayshaun Needle) came into the office to request a refill for Mr. Seever's Metoprolol 50mg  to be sent to [facility]. I am forwarding the message to the CMA and Dr. Laural Benes. Wife stated that this was her 3rd time requesting and wish to receive a call at [phone] when complete.

## 2023-08-16 NOTE — Telephone Encounter (Signed)
Requested Prescriptions  Pending Prescriptions Disp Refills   metoprolol succinate (TOPROL-XL) 50 MG 24 hr tablet [Pharmacy Med Name: METOPROLOL SUCC ER 50 MG TAB] 90 tablet 0    Sig: Take 1 tablet (50 mg total) by mouth daily.     Cardiovascular:  Beta Blockers Passed - 08/15/2023 12:11 PM      Passed - Last BP in normal range    BP Readings from Last 1 Encounters:  07/07/23 130/79         Passed - Last Heart Rate in normal range    Pulse Readings from Last 1 Encounters:  07/07/23 71         Passed - Valid encounter within last 6 months    Recent Outpatient Visits           1 month ago Atrial fibrillation, chronic (HCC)   [place] [facility] [place], Megan P, DO   3 months ago Acute confusion   [place] [facility] [place], [place], DO   5 months ago Confusion   [place] [facility] [place], [place], DO   5 months ago Syncope, unspecified syncope type   [place] [facility] [place], Megan P, DO   6 months ago Mild cognitive impairment   [place] [facility] [place], Sherran Needs, NP       Future Appointments             In 2 weeks Dorcas Carrow, DO [facility]   In 1 month Laurier Nancy, MD [facility]

## 2023-08-19 ENCOUNTER — Ambulatory Visit: Payer: Medicare Other | Admitting: Family Medicine

## 2023-08-30 ENCOUNTER — Encounter: Payer: Self-pay | Admitting: Family Medicine

## 2023-08-30 ENCOUNTER — Ambulatory Visit (INDEPENDENT_AMBULATORY_CARE_PROVIDER_SITE_OTHER): Payer: Medicare Other | Admitting: Family Medicine

## 2023-08-30 VITALS — BP 160/80 | HR 56 | Ht 69.0 in | Wt 164.0 lb

## 2023-08-30 DIAGNOSIS — I10 Essential (primary) hypertension: Secondary | ICD-10-CM

## 2023-08-30 MED ORDER — LOSARTAN POTASSIUM 100 MG PO TABS: 100.0000 mg | ORAL_TABLET | Freq: Every day | ORAL | 0 refills | Status: DC

## 2023-08-30 NOTE — Assessment & Plan Note (Signed)
BP still running high. Will increase his losartan to 100mg  and recheck 1 month. Call with any concerns.

## 2023-08-30 NOTE — Progress Notes (Signed)
BP (!) 160/80   Pulse (!) 56   Ht 5\' 9"  (1.753 m)   Wt 164 lb (74.4 kg)   SpO2 98%   BMI 24.22 kg/m    Subjective:    Patient ID: Carlos Richard, male    DOB: 04-01-1932, 89 y.o.   MRN: [id number]  HPI: Carlos Richard is a 89 y.o. male  Chief Complaint  Patient presents with   Hypertension   HYPERTENSION  Hypertension status: stable  Satisfied with current treatment? yes Duration of hypertension: chronic BP monitoring frequency:  not checking BP medication side effects:  no Medication compliance: excellent compliance Previous BP meds: losartan, lasix, metoprolol Aspirin: no Recurrent headaches: no Visual changes: no Palpitations: no Dyspnea: no Chest pain: no Lower extremity edema: no Dizzy/lightheaded: no  Relevant past medical, surgical, family and social history reviewed and updated as indicated. Interim medical history since our last visit reviewed. Allergies and medications reviewed and updated.  Review of Systems  Constitutional: Negative.   Respiratory: Negative.    Cardiovascular: Negative.   Gastrointestinal: Negative.   Musculoskeletal: Negative.   Psychiatric/Behavioral: Negative.      Per HPI unless specifically indicated above     Objective:    BP (!) 160/80   Pulse (!) 56   Ht 5\' 9"  (1.753 m)   Wt 164 lb (74.4 kg)   SpO2 98%   BMI 24.22 kg/m   Wt Readings from Last 3 Encounters:  08/30/23 164 lb (74.4 kg)  07/07/23 162 lb 12.8 oz (73.8 kg)  07/01/23 162 lb 6.4 oz (73.7 kg)    Physical Exam Vitals and nursing note reviewed.  Constitutional:      General: He is not in acute distress.    Appearance: Normal appearance. He is not ill-appearing, toxic-appearing or diaphoretic.  HENT:     Head: Normocephalic and atraumatic.     Right Ear: External ear normal.     Left Ear: External ear normal.     Nose: Nose normal.     Mouth/Throat:     Mouth: Mucous membranes are moist.     Pharynx: Oropharynx is clear.  Eyes:     General:  No scleral icterus.       Right eye: No discharge.        Left eye: No discharge.     Extraocular Movements: Extraocular movements intact.     Conjunctiva/sclera: Conjunctivae normal.     Pupils: Pupils are equal, round, and reactive to light.  Cardiovascular:     Rate and Rhythm: Normal rate and regular rhythm.     Pulses: Normal pulses.     Heart sounds: Normal heart sounds. No murmur heard.    No friction rub. No gallop.  Pulmonary:     Effort: Pulmonary effort is normal. No respiratory distress.     Breath sounds: Normal breath sounds. No stridor. No wheezing, rhonchi or rales.  Chest:     Chest wall: No tenderness.  Musculoskeletal:        General: Normal range of motion.     Cervical back: Normal range of motion and neck supple.  Skin:    General: Skin is warm and dry.     Capillary Refill: Capillary refill takes less than 2 seconds.     Coloration: Skin is not jaundiced or pale.     Findings: No bruising, erythema, lesion or rash.  Neurological:     General: No focal deficit present.     Mental Status: He is  alert and oriented to person, place, and time. Mental status is at baseline.  Psychiatric:        Mood and Affect: Mood normal.        Behavior: Behavior normal.        Thought Content: Thought content normal.        Judgment: Judgment normal.     Results for orders placed or performed in visit on 07/01/23  CBC with Differential/Platelet  Result Value Ref Range   WBC 10.0 3.4 - 10.8 x10E3/uL   RBC 4.71 4.14 - 5.80 x10E6/uL   Hemoglobin 14.8 13.0 - 17.7 g/dL   Hematocrit 81.1 91.4 - 51.0 %   MCV 92 79 - 97 fL   MCH 31.4 26.6 - 33.0 pg   MCHC 34.2 31.5 - 35.7 g/dL   RDW 78.2 95.6 - 21.3 %   Platelets 210 150 - 450 x10E3/uL   Neutrophils 53 Not Estab. %   Lymphs 35 Not Estab. %   Monocytes 8 Not Estab. %   Eos 3 Not Estab. %   Basos 1 Not Estab. %   Neutrophils Absolute 5.3 1.4 - 7.0 x10E3/uL   Lymphocytes Absolute 3.5 (H) 0.7 - 3.1 x10E3/uL   Monocytes  Absolute 0.8 0.1 - 0.9 x10E3/uL   EOS (ABSOLUTE) 0.3 0.0 - 0.4 x10E3/uL   Basophils Absolute 0.1 0.0 - 0.2 x10E3/uL   Immature Granulocytes 0 Not Estab. %   Immature Grans (Abs) 0.0 0.0 - 0.1 x10E3/uL  Comprehensive metabolic panel  Result Value Ref Range   Glucose 96 70 - 99 mg/dL   BUN 10 8 - 27 mg/dL   Creatinine, Ser 0.86 0.76 - 1.27 mg/dL   eGFR 55 (L) >57 QI/ONG/2.95   BUN/Creatinine Ratio 8 (L) 10 - 24   Sodium 143 134 - 144 mmol/L   Potassium 4.9 3.5 - 5.2 mmol/L   Chloride 103 96 - 106 mmol/L   CO2 27 20 - 29 mmol/L   Calcium 9.8 8.6 - 10.2 mg/dL   Total Protein 6.8 6.0 - 8.5 g/dL   Albumin 4.3 3.7 - 4.7 g/dL   Globulin, Total 2.5 1.5 - 4.5 g/dL   Bilirubin Total 0.5 0.0 - 1.2 mg/dL   Alkaline Phosphatase 81 44 - 121 IU/L   AST 28 0 - 40 IU/L   ALT 24 0 - 44 IU/L  Lipid Panel w/o Chol/HDL Ratio  Result Value Ref Range   Cholesterol, Total 143 100 - 199 mg/dL   Triglycerides 284 0 - 149 mg/dL   HDL 42 >13 mg/dL   VLDL Cholesterol Cal 25 5 - 40 mg/dL   LDL Chol Calc (NIH) 76 0 - 99 mg/dL  Uric acid  Result Value Ref Range   Uric Acid 5.2 3.8 - 8.4 mg/dL      Assessment & Plan:   Problem List Items Addressed This Visit       Cardiovascular and Mediastinum   HTN (hypertension) - Primary    BP still running high. Will increase his losartan to 100mg  and recheck 1 month. Call with any concerns.       Relevant Medications   losartan (COZAAR) 100 MG tablet     Follow up plan: Return in about 4 weeks (around 09/27/2023) for follow up BP.

## 2023-09-01 ENCOUNTER — Other Ambulatory Visit: Payer: Self-pay | Admitting: Cardiovascular Disease

## 2023-09-06 ENCOUNTER — Telehealth: Payer: Self-pay

## 2023-09-06 NOTE — Telephone Encounter (Signed)
Patients wife called asking about medications but didn't state what she needed

## 2023-09-06 NOTE — Telephone Encounter (Signed)
Patient wife was called and informed that patient is low on xarleto and if we had any samples, we do not have any samples at this time.

## 2023-09-07 DIAGNOSIS — S62015A Nondisplaced fracture of distal pole of navicular [scaphoid] bone of left wrist, initial encounter for closed fracture: Secondary | ICD-10-CM | POA: Diagnosis not present

## 2023-09-19 ENCOUNTER — Ambulatory Visit: Payer: Self-pay | Admitting: *Deleted

## 2023-09-19 NOTE — Telephone Encounter (Signed)
There wasn't an opening on her schedule when I looked to see if I could schedule her for today and I just looked again.  There are no openings on her schedule.

## 2023-09-19 NOTE — Telephone Encounter (Signed)
I have no availability today. I would advise him to go to the ER if he is confused or it happens again. Please make appointment with next provider who has availability.

## 2023-09-19 NOTE — Telephone Encounter (Signed)
  Chief Complaint: Pt had an episode of confusion yesterday and during the night.   Wife, Carlos Richard calling in requesting he be seen today if possible by Dr. Laural Benes.   She is aware of this issue. Symptoms: confusion, didn't know where he was, had difficulty urinating into the urinal because he didn't know what to do.   Carlos Richard had to assist him which is not normal for him.   Not confused now. Frequency: Yesterday and last night Pertinent Negatives: Patient denies being confused now.   Denies any other symptoms.  See notes. Disposition: [] ED /[] Urgent Care (no appt availability in office) / [x] Appointment(In office/virtual)/ []  [facility]/ [] Home Care/ [] Refused Recommended Disposition /[] [place] [facility]/ []  Follow-up with PCP Additional Notes: Carlos Richard is asking if he can be worked in with Dr. Laural Benes today.   There are no appts with any of the provider today.   She really prefers he see Dr. Laural Benes since she knows him and is familiar with this issue.    I'm sending a high priority  message to Dr. Laural Benes and Carlos Richard is agreeable to someone calling her back.

## 2023-09-19 NOTE — Telephone Encounter (Signed)
Called and scheduled pt on 09/20/2023 @ 10:40 am, also advised wife to take him to ER if the incident occurs again per the provider.

## 2023-09-19 NOTE — Telephone Encounter (Signed)
No    Reason for Disposition  [1] Acting confused (e.g., disoriented, slurred speech) AND [2] brief (now gone)  Answer Assessment - Initial Assessment Questions 1. LEVEL OF CONSCIOUSNESS: "How is he (she, the patient) acting right now?" (e.g., alert-oriented, confused, lethargic, stuporous, comatose)     Wife, Adarsh Mundorf calling in.  He is there in the background answering the questions. 2. ONSET: "When did the confusion start?"  (minutes, hours, days)     Yesterday and he did not feel good yesterday either.   During the night around 1:30 AM he was confused.   He needed to go to the bathroom.   When he got in there he was confused and didn't know where he was.   He didn't know what to do in order to urinate.    I had to come help him and hold the urina for him.   He did urinate without a problem.   No burning or frequency.  He denies any pain anywhere.   No fever.    Denies being confused right now.     Wife concerned about the confusion.     3. PATTERN "Does this come and go, or has it been constant since it started?"  "Is it present now?"     Intermittent.   He has done this before and Dr. Laural Benes is aware of this but I would like for Dr. Laural Benes to see him today if possible.  4. ALCOHOL or DRUGS: "Has he been drinking alcohol or taking any drugs?"      Not asked 5. NARCOTIC MEDICINES: "Has he been receiving any narcotic medications?" (e.g., morphine, Vicodin)     Not asked  6. CAUSE: "What do you think is causing the confusion?"      Per wife, we are not sure what is causing the confusion. 7. OTHER SYMPTOMS: "Are there any other symptoms?" (e.g., difficulty breathing, headache, fever, weakness)     Denies any other symptoms. Pain, fever, urinary symptoms.  Protocols used: Confusion - Delirium-A-AH

## 2023-09-20 ENCOUNTER — Encounter: Payer: Self-pay | Admitting: Family Medicine

## 2023-09-20 ENCOUNTER — Ambulatory Visit: Payer: Medicare Other | Admitting: Family Medicine

## 2023-09-20 VITALS — BP 145/74 | HR 71 | Wt 162.6 lb

## 2023-09-20 DIAGNOSIS — R410 Disorientation, unspecified: Secondary | ICD-10-CM | POA: Diagnosis not present

## 2023-09-20 LAB — URINALYSIS, ROUTINE W REFLEX MICROSCOPIC
Bilirubin, UA: NEGATIVE
Glucose, UA: NEGATIVE
Ketones, UA: NEGATIVE
Leukocytes,UA: NEGATIVE
Nitrite, UA: NEGATIVE
Protein,UA: NEGATIVE
Specific Gravity, UA: 1.015 (ref 1.005–1.030)
Urobilinogen, Ur: 0.2 mg/dL (ref 0.2–1.0)
pH, UA: 6 (ref 5.0–7.5)

## 2023-09-20 LAB — MICROSCOPIC EXAMINATION: Bacteria, UA: NONE SEEN

## 2023-09-20 NOTE — Progress Notes (Signed)
BP (!) 145/74   Pulse 71   Wt 162 lb 9.6 oz (73.8 kg)   SpO2 96%   BMI 24.01 kg/m    Subjective:    Patient ID: Carlos Richard, male    DOB: 01-27-1932, 89 y.o.   MRN: [id number]  HPI: Carlos Richard is a 89 y.o. male  Chief Complaint  Patient presents with   Altered Mental Status    Patient wife says Sunday they were napping, and she noticed the patient woke up licking his lips, has some tremors in his hands and then numbness in his fingers. Patient wife says this does not happen often, but it has happened before but months ago. Patient wife says she also keeps a urinal in the bedroom, so the patient does not have to walk to the bathroom constantly, and one night he got up insisting on going to the bathroom instead of using the urinal.    He was confused 2x Sunday during the day and early Monday morning. His wife notes that he had been asleep and then got up to go to the bathroom. When he got up, he was licking his lips and he had a tremor and rubbing his hand like his hand was numb. He was confused- didn't know where he was, but otherwsie OK. It resolved pretty quickly in a few minutes, but then it came back a few days later. This also happened a few months ago. His wife notes that she's concerned because she's not sure why it's happening. He has otherwise been doing well with no other concerns or complaints at this time.   Relevant past medical, surgical, family and social history reviewed and updated as indicated. Interim medical history since our last visit reviewed. Allergies and medications reviewed and updated.  Review of Systems  Constitutional: Negative.   Respiratory:  Positive for shortness of breath. Negative for apnea, cough, choking, chest tightness, wheezing and stridor.   Cardiovascular: Negative.   Gastrointestinal: Negative.   Genitourinary: Negative.   Neurological:  Positive for tremors. Negative for dizziness, seizures, syncope, facial asymmetry, speech  difficulty, weakness, light-headedness, numbness and headaches.  Psychiatric/Behavioral:  Positive for confusion. Negative for agitation, behavioral problems, decreased concentration, dysphoric mood, hallucinations, self-injury, sleep disturbance and suicidal ideas. The patient is not nervous/anxious and is not hyperactive.     Per HPI unless specifically indicated above     Objective:    BP (!) 145/74   Pulse 71   Wt 162 lb 9.6 oz (73.8 kg)   SpO2 96%   BMI 24.01 kg/m   Wt Readings from Last 3 Encounters:  09/20/23 162 lb 9.6 oz (73.8 kg)  08/30/23 164 lb (74.4 kg)  07/07/23 162 lb 12.8 oz (73.8 kg)    Physical Exam Vitals and nursing note reviewed.  Constitutional:      General: He is not in acute distress.    Appearance: Normal appearance. He is not ill-appearing, toxic-appearing or diaphoretic.  HENT:     Head: Normocephalic and atraumatic.     Right Ear: External ear normal.     Left Ear: External ear normal.     Nose: Nose normal.     Mouth/Throat:     Mouth: Mucous membranes are moist.     Pharynx: Oropharynx is clear.  Eyes:     General: No scleral icterus.       Right eye: No discharge.        Left eye: No discharge.  Extraocular Movements: Extraocular movements intact.     Conjunctiva/sclera: Conjunctivae normal.     Pupils: Pupils are equal, round, and reactive to light.  Cardiovascular:     Rate and Rhythm: Normal rate and regular rhythm.     Pulses: Normal pulses.     Heart sounds: Normal heart sounds. No murmur heard.    No friction rub. No gallop.  Pulmonary:     Effort: Pulmonary effort is normal. No respiratory distress.     Breath sounds: Normal breath sounds. No stridor. No wheezing, rhonchi or rales.  Chest:     Chest wall: No tenderness.  Musculoskeletal:        General: Normal range of motion.     Cervical back: Normal range of motion and neck supple.  Skin:    General: Skin is warm and dry.     Capillary Refill: Capillary refill takes  less than 2 seconds.     Coloration: Skin is not jaundiced or pale.     Findings: No bruising, erythema, lesion or rash.  Neurological:     General: No focal deficit present.     Mental Status: He is alert and oriented to person, place, and time. Mental status is at baseline.  Psychiatric:        Mood and Affect: Mood normal.        Behavior: Behavior normal.        Thought Content: Thought content normal.        Judgment: Judgment normal.     Results for orders placed or performed in visit on 09/20/23  Microscopic Examination   Urine  Result Value Ref Range   WBC, UA 0-5 0 - 5 /hpf   RBC, Urine 0-2 0 - 2 /hpf   Epithelial Cells (non renal) 0-10 0 - 10 /hpf   Bacteria, UA None seen None seen/Few  CBC with Differential/Platelet  Result Value Ref Range   WBC 9.5 3.4 - 10.8 x10E3/uL   RBC 4.64 4.14 - 5.80 x10E6/uL   Hemoglobin 14.2 13.0 - 17.7 g/dL   Hematocrit 16.1 09.6 - 51.0 %   MCV 94 79 - 97 fL   MCH 30.6 26.6 - 33.0 pg   MCHC 32.4 31.5 - 35.7 g/dL   RDW 04.5 40.9 - 81.1 %   Platelets 202 150 - 450 x10E3/uL   Neutrophils 51 Not Estab. %   Lymphs 37 Not Estab. %   Monocytes 9 Not Estab. %   Eos 2 Not Estab. %   Basos 1 Not Estab. %   Neutrophils Absolute 4.9 1.4 - 7.0 x10E3/uL   Lymphocytes Absolute 3.5 (H) 0.7 - 3.1 x10E3/uL   Monocytes Absolute 0.8 0.1 - 0.9 x10E3/uL   EOS (ABSOLUTE) 0.2 0.0 - 0.4 x10E3/uL   Basophils Absolute 0.1 0.0 - 0.2 x10E3/uL   Immature Granulocytes 0 Not Estab. %   Immature Grans (Abs) 0.0 0.0 - 0.1 x10E3/uL  Comprehensive metabolic panel  Result Value Ref Range   Glucose 99 70 - 99 mg/dL   BUN 12 8 - 27 mg/dL   Creatinine, Ser 9.14 0.76 - 1.27 mg/dL   eGFR 60 >78 GN/FAO/1.30   BUN/Creatinine Ratio 10 10 - 24   Sodium 142 134 - 144 mmol/L   Potassium 4.2 3.5 - 5.2 mmol/L   Chloride 103 96 - 106 mmol/L   CO2 25 20 - 29 mmol/L   Calcium 9.5 8.6 - 10.2 mg/dL   Total Protein 6.5 6.0 - 8.5 g/dL   Albumin  4.2 3.7 - 4.7 g/dL   Globulin,  Total 2.3 1.5 - 4.5 g/dL   Bilirubin Total 0.5 0.0 - 1.2 mg/dL   Alkaline Phosphatase 72 44 - 121 IU/L   AST 28 0 - 40 IU/L   ALT 25 0 - 44 IU/L  B12  Result Value Ref Range   Vitamin B-12 663 232 - 1,245 pg/mL  TSH  Result Value Ref Range   TSH 1.510 0.450 - 4.500 uIU/mL  VITAMIN D 25 Hydroxy (Vit-D Deficiency, Fractures)  Result Value Ref Range   Vit D, 25-Hydroxy 40.3 30.0 - 100.0 ng/mL  Urinalysis, Routine w reflex microscopic  Result Value Ref Range   Specific Gravity, UA 1.015 1.005 - 1.030   pH, UA 6.0 5.0 - 7.5   Color, UA Yellow Yellow   Appearance Ur Clear Clear   Leukocytes,UA Negative Negative   Protein,UA Negative Negative/Trace   Glucose, UA Negative Negative   Ketones, UA Negative Negative   RBC, UA Trace (A) Negative   Bilirubin, UA Negative Negative   Urobilinogen, Ur 0.2 0.2 - 1.0 mg/dL   Nitrite, UA Negative Negative   Microscopic Examination See below:       Assessment & Plan:   Problem List Items Addressed This Visit   None Visit Diagnoses     Confusion    -  Primary   Will recheck labs and get him back into neurology. Cannot have MRI due to stimulator. Continue to monitor. Call with any concerns.   Relevant Orders   CBC with Differential/Platelet (Completed)   Comprehensive metabolic panel (Completed)   B12 (Completed)   TSH (Completed)   VITAMIN D 25 Hydroxy (Vit-D Deficiency, Fractures) (Completed)   Urinalysis, Routine w reflex microscopic (Completed)        Follow up plan: Return as scheduled.

## 2023-09-22 LAB — COMPREHENSIVE METABOLIC PANEL
ALT: 25 [IU]/L (ref 0–44)
AST: 28 [IU]/L (ref 0–40)
Albumin: 4.2 g/dL (ref 3.7–4.7)
Alkaline Phosphatase: 72 IU/L (ref 44–121)
BUN/Creatinine Ratio: 10 (ref 10–24)
BUN: 12 mg/dL (ref 8–27)
Bilirubin Total: 0.5 mg/dL (ref 0.0–1.2)
CO2: 25 mmol/L (ref 20–29)
Calcium: 9.5 mg/dL (ref 8.6–10.2)
Chloride: 103 mmol/L (ref 96–106)
Creatinine, Ser: 1.16 mg/dL (ref 0.76–1.27)
Globulin, Total: 2.3 g/dL (ref 1.5–4.5)
Glucose: 99 mg/dL (ref 70–99)
Potassium: 4.2 mmol/L (ref 3.5–5.2)
Sodium: 142 mmol/L (ref 134–144)
Total Protein: 6.5 g/dL (ref 6.0–8.5)
eGFR: 60 mL/min/{1.73_m2} (ref >59)

## 2023-09-22 LAB — CBC WITH DIFFERENTIAL/PLATELET
Basophils Absolute: 0.1 10*3/uL (ref 0.0–0.2)
Basos: 1 %
EOS (ABSOLUTE): 0.2 10*3/uL (ref 0.0–0.4)
Eos: 2 %
Hematocrit: 43.8 % (ref 37.5–51.0)
Hemoglobin: 14.2 g/dL (ref 13.0–17.7)
Immature Grans (Abs): 0 10*3/uL (ref 0.0–0.1)
Immature Granulocytes: 0 %
Lymphocytes Absolute: 3.5 10*3/uL — ABNORMAL HIGH (ref 0.7–3.1)
Lymphs: 37 %
MCH: 30.6 pg (ref 26.6–33.0)
MCHC: 32.4 g/dL (ref 31.5–35.7)
MCV: 94 fL (ref 79–97)
Monocytes Absolute: 0.8 10*3/uL (ref 0.1–0.9)
Monocytes: 9 %
Neutrophils Absolute: 4.9 10*3/uL (ref 1.4–7.0)
Neutrophils: 51 %
Platelets: 202 10*3/uL (ref 150–450)
RBC: 4.64 x10E6/uL (ref 4.14–5.80)
RDW: 13.1 % (ref 11.6–15.4)
WBC: 9.5 10*3/uL (ref 3.4–10.8)

## 2023-09-22 LAB — VITAMIN D 25 HYDROXY (VIT D DEFICIENCY, FRACTURES): Vit D, 25-Hydroxy: 40.3 ng/mL (ref 30.0–100.0)

## 2023-09-22 LAB — TSH: TSH: 1.51 u[IU]/mL (ref 0.450–4.500)

## 2023-09-22 LAB — VITAMIN B12: Vitamin B-12: 663 pg/mL (ref 232–1245)

## 2023-09-25 ENCOUNTER — Encounter: Payer: Self-pay | Admitting: Family Medicine

## 2023-09-26 ENCOUNTER — Other Ambulatory Visit: Payer: Self-pay | Admitting: Family Medicine

## 2023-09-26 NOTE — Telephone Encounter (Signed)
Rx was sent to pharmacy on 07/01/23 #90/1 RF.   Requested Prescriptions  Pending Prescriptions Disp Refills   simvastatin (ZOCOR) 20 MG tablet [Pharmacy Med Name: SIMVASTATIN 20 MG TABLET] 90 tablet 0    Sig: Take 1 tablet (20 mg total) by mouth daily.     Cardiovascular:  Antilipid - Statins Failed - 09/26/2023  9:12 AM      Failed - Lipid Panel in normal range within the last 12 months    Cholesterol, Total  Date Value Ref Range Status  07/01/2023 143 100 - 199 mg/dL Final   LDL Chol Calc (NIH)  Date Value Ref Range Status  07/01/2023 76 0 - 99 mg/dL Final   HDL  Date Value Ref Range Status  07/01/2023 42 >39 mg/dL Final   Triglycerides  Date Value Ref Range Status  07/01/2023 143 0 - 149 mg/dL Final         Passed - Patient is not pregnant      Passed - Valid encounter within last 12 months    Recent Outpatient Visits           6 days ago Confusion   [facility] [place], Megan P, DO   3 weeks ago Primary hypertension   [place] [facility] [place], Megan P, DO   2 months ago Atrial fibrillation, chronic [facility])   [facility] [place], Megan P, DO   4 months ago Acute confusion   [place] [facility] [place], Radisson, DO   6 months ago Confusion   [place] [facility] [place], Oralia Rud, DO       Future Appointments             In 4 days Laurier Nancy, MD [facility]   In 4 days Dorcas Carrow, DO [facility]

## 2023-09-30 ENCOUNTER — Encounter: Payer: Self-pay | Admitting: Cardiovascular Disease

## 2023-09-30 ENCOUNTER — Ambulatory Visit (INDEPENDENT_AMBULATORY_CARE_PROVIDER_SITE_OTHER): Payer: Medicare Other | Admitting: Family Medicine

## 2023-09-30 ENCOUNTER — Ambulatory Visit: Payer: Medicare Other | Admitting: Cardiovascular Disease

## 2023-09-30 ENCOUNTER — Encounter: Payer: Self-pay | Admitting: Family Medicine

## 2023-09-30 VITALS — BP 150/80 | HR 80 | Ht 69.0 in | Wt 163.4 lb

## 2023-09-30 VITALS — BP 178/73 | HR 57 | Temp 97.7°F | Ht 69.0 in | Wt 163.8 lb

## 2023-09-30 DIAGNOSIS — I340 Nonrheumatic mitral (valve) insufficiency: Secondary | ICD-10-CM

## 2023-09-30 DIAGNOSIS — I10 Essential (primary) hypertension: Secondary | ICD-10-CM | POA: Diagnosis not present

## 2023-09-30 DIAGNOSIS — E782 Mixed hyperlipidemia: Secondary | ICD-10-CM | POA: Diagnosis not present

## 2023-09-30 DIAGNOSIS — I129 Hypertensive chronic kidney disease with stage 1 through stage 4 chronic kidney disease, or unspecified chronic kidney disease: Secondary | ICD-10-CM | POA: Diagnosis not present

## 2023-09-30 DIAGNOSIS — I4891 Unspecified atrial fibrillation: Secondary | ICD-10-CM | POA: Diagnosis not present

## 2023-09-30 DIAGNOSIS — I351 Nonrheumatic aortic (valve) insufficiency: Secondary | ICD-10-CM

## 2023-09-30 MED ORDER — AMLODIPINE BESYLATE 5 MG PO TABS: 5.0000 mg | ORAL_TABLET | Freq: Every day | ORAL | 11 refills | Status: DC

## 2023-09-30 NOTE — Progress Notes (Signed)
BP (!) 178/73 (BP Location: Right Arm, Patient Position: Sitting, Cuff Size: Normal)   Pulse (!) 57   Temp 97.7 F (36.5 C) (Oral)   Ht 5\' 9"  (1.753 m)   Wt 163 lb 12.8 oz (74.3 kg)   SpO2 94%   BMI 24.19 kg/m    Subjective:    Patient ID: Carlos Richard, male    DOB: December 24, 1931, 89 y.o.   MRN: [id number]  HPI: Carlos Richard is a 89 y.o. male  Chief Complaint  Patient presents with   Follow-up   Blood Pressure Check    No new concerns today    HYPERTENSION- saw Cardiology this AM and started on amlodipine Hypertension status: uncontrolled  Satisfied with current treatment? no Duration of hypertension: chronic BP monitoring frequency:  not checking BP medication side effects:  no Medication compliance: excellent compliance Previous BP meds:amlodipine, losartan, metoprolol Aspirin: no Recurrent headaches: no Visual changes: no Palpitations: no Dyspnea: no Chest pain: no Lower extremity edema: no Dizzy/lightheaded: no  Relevant past medical, surgical, family and social history reviewed and updated as indicated. Interim medical history since our last visit reviewed. Allergies and medications reviewed and updated.  Review of Systems  Constitutional: Negative.   Respiratory: Negative.    Cardiovascular: Negative.   Gastrointestinal: Negative.   Musculoskeletal: Negative.   Psychiatric/Behavioral: Negative.      Per HPI unless specifically indicated above     Objective:    BP (!) 178/73 (BP Location: Right Arm, Patient Position: Sitting, Cuff Size: Normal)   Pulse (!) 57   Temp 97.7 F (36.5 C) (Oral)   Ht 5\' 9"  (1.753 m)   Wt 163 lb 12.8 oz (74.3 kg)   SpO2 94%   BMI 24.19 kg/m   Wt Readings from Last 3 Encounters:  09/30/23 163 lb 12.8 oz (74.3 kg)  09/30/23 163 lb 6.4 oz (74.1 kg)  09/20/23 162 lb 9.6 oz (73.8 kg)    Physical Exam Vitals and nursing note reviewed.  Constitutional:      General: He is not in acute distress.    Appearance:  Normal appearance. He is not ill-appearing, toxic-appearing or diaphoretic.  HENT:     Head: Normocephalic and atraumatic.     Right Ear: External ear normal.     Left Ear: External ear normal.     Nose: Nose normal.     Mouth/Throat:     Mouth: Mucous membranes are moist.     Pharynx: Oropharynx is clear.  Eyes:     General: No scleral icterus.       Right eye: No discharge.        Left eye: No discharge.     Extraocular Movements: Extraocular movements intact.     Conjunctiva/sclera: Conjunctivae normal.     Pupils: Pupils are equal, round, and reactive to light.  Cardiovascular:     Rate and Rhythm: Normal rate and regular rhythm.     Pulses: Normal pulses.     Heart sounds: Normal heart sounds. No murmur heard.    No friction rub. No gallop.  Pulmonary:     Effort: Pulmonary effort is normal. No respiratory distress.     Breath sounds: Normal breath sounds. No stridor. No wheezing, rhonchi or rales.  Chest:     Chest wall: No tenderness.  Musculoskeletal:        General: Normal range of motion.     Cervical back: Normal range of motion and neck supple.  Skin:  General: Skin is warm and dry.     Capillary Refill: Capillary refill takes less than 2 seconds.     Coloration: Skin is not jaundiced or pale.     Findings: No bruising, erythema, lesion or rash.  Neurological:     General: No focal deficit present.     Mental Status: He is alert and oriented to person, place, and time. Mental status is at baseline.  Psychiatric:        Mood and Affect: Mood normal.        Behavior: Behavior normal.        Thought Content: Thought content normal.        Judgment: Judgment normal.     Results for orders placed or performed in visit on 09/20/23  Microscopic Examination   Urine  Result Value Ref Range   WBC, UA 0-5 0 - 5 /hpf   RBC, Urine 0-2 0 - 2 /hpf   Epithelial Cells (non renal) 0-10 0 - 10 /hpf   Bacteria, UA None seen None seen/Few  CBC with Differential/Platelet   Result Value Ref Range   WBC 9.5 3.4 - 10.8 x10E3/uL   RBC 4.64 4.14 - 5.80 x10E6/uL   Hemoglobin 14.2 13.0 - 17.7 g/dL   Hematocrit 40.9 81.1 - 51.0 %   MCV 94 79 - 97 fL   MCH 30.6 26.6 - 33.0 pg   MCHC 32.4 31.5 - 35.7 g/dL   RDW 91.4 78.2 - 95.6 %   Platelets 202 150 - 450 x10E3/uL   Neutrophils 51 Not Estab. %   Lymphs 37 Not Estab. %   Monocytes 9 Not Estab. %   Eos 2 Not Estab. %   Basos 1 Not Estab. %   Neutrophils Absolute 4.9 1.4 - 7.0 x10E3/uL   Lymphocytes Absolute 3.5 (H) 0.7 - 3.1 x10E3/uL   Monocytes Absolute 0.8 0.1 - 0.9 x10E3/uL   EOS (ABSOLUTE) 0.2 0.0 - 0.4 x10E3/uL   Basophils Absolute 0.1 0.0 - 0.2 x10E3/uL   Immature Granulocytes 0 Not Estab. %   Immature Grans (Abs) 0.0 0.0 - 0.1 x10E3/uL  Comprehensive metabolic panel  Result Value Ref Range   Glucose 99 70 - 99 mg/dL   BUN 12 8 - 27 mg/dL   Creatinine, Ser 2.13 0.76 - 1.27 mg/dL   eGFR 60 >08 MV/HQI/6.96   BUN/Creatinine Ratio 10 10 - 24   Sodium 142 134 - 144 mmol/L   Potassium 4.2 3.5 - 5.2 mmol/L   Chloride 103 96 - 106 mmol/L   CO2 25 20 - 29 mmol/L   Calcium 9.5 8.6 - 10.2 mg/dL   Total Protein 6.5 6.0 - 8.5 g/dL   Albumin 4.2 3.7 - 4.7 g/dL   Globulin, Total 2.3 1.5 - 4.5 g/dL   Bilirubin Total 0.5 0.0 - 1.2 mg/dL   Alkaline Phosphatase 72 44 - 121 IU/L   AST 28 0 - 40 IU/L   ALT 25 0 - 44 IU/L  B12  Result Value Ref Range   Vitamin B-12 663 232 - 1,245 pg/mL  TSH  Result Value Ref Range   TSH 1.510 0.450 - 4.500 uIU/mL  VITAMIN D 25 Hydroxy (Vit-D Deficiency, Fractures)  Result Value Ref Range   Vit D, 25-Hydroxy 40.3 30.0 - 100.0 ng/mL  Urinalysis, Routine w reflex microscopic  Result Value Ref Range   Specific Gravity, UA 1.015 1.005 - 1.030   pH, UA 6.0 5.0 - 7.5   Color, UA Yellow Yellow  Appearance Ur Clear Clear   Leukocytes,UA Negative Negative   Protein,UA Negative Negative/Trace   Glucose, UA Negative Negative   Ketones, UA Negative Negative   RBC, UA Trace (A)  Negative   Bilirubin, UA Negative Negative   Urobilinogen, Ur 0.2 0.2 - 1.0 mg/dL   Nitrite, UA Negative Negative   Microscopic Examination See below:       Assessment & Plan:   Problem List Items Addressed This Visit       Cardiovascular and Mediastinum   HTN (hypertension) - Primary    Just had amlodipine added by cardiology. Will recheck in 1 month. Call with any concerns.         Follow up plan: Return in about 4 weeks (around 10/28/2023).

## 2023-09-30 NOTE — Progress Notes (Signed)
Cardiology Office Note   Date:  09/30/2023   ID:  Carlos Richard, DOB 06/15/1932, MRN [id number]  PCP:  Dorcas Carrow, DO  Cardiologist:  Adrian Blackwater, MD      History of Present Illness: Carlos Richard is a 89 y.o. male who presents for  Chief Complaint  Patient presents with   Follow-up    3 month follow up    No SOB      Past Medical History:  Diagnosis Date   Atrial fibrillation (HCC)    COPD (chronic obstructive pulmonary disease) (HCC)    Hypertension    Near syncope    Tremors of nervous system      Past Surgical History:  Procedure Laterality Date   BACK SURGERY     BRAIN SURGERY     stimulator placement for tremors   EYE SURGERY Left 2020   cataract surgery nov    EYE SURGERY Right 2020   cataract surgery dec     Current Outpatient Medications  Medication Sig Dispense Refill   allopurinol (ZYLOPRIM) 100 MG tablet Take 1 tablet (100 mg total) by mouth daily. Monday, Wednesday, Friday 90 tablet 1   amLODipine (NORVASC) 5 MG tablet Take 1 tablet (5 mg total) by mouth daily. 30 tablet 11   fluticasone-salmeterol (ADVAIR DISKUS) 250-50 MCG/ACT AEPB INHALE 1 PUFF INTO THE LUNGS TWICE A DAY 180 each 1   furosemide (LASIX) 20 MG tablet TAKE 1 TABLET DAILY. 90 tablet 0   losartan (COZAAR) 100 MG tablet Take 1 tablet (100 mg total) by mouth daily. 90 tablet 0   magnesium oxide (MAG-OX) 400 MG tablet Take 400 mg by mouth daily.     metoprolol succinate (TOPROL-XL) 50 MG 24 hr tablet Take 1 tablet (50 mg total) by mouth daily. 90 tablet 0   Multiple Vitamins-Minerals (ZINC PO) Take 1 tablet by mouth daily.     simvastatin (ZOCOR) 20 MG tablet Take 1 tablet (20 mg total) by mouth daily. 90 tablet 1   triamcinolone cream (KENALOG) 0.1 % Apply 1 Application topically 2 (two) times daily.     XARELTO 15 MG TABS tablet Take 1 tablet (15 mg total) by mouth daily with supper. 90 tablet 3   Zinc 50 MG TABS Take 50 mg by mouth daily.      Current  Facility-Administered Medications  Medication Dose Route Frequency Provider Last Rate Last Admin   cyanocobalamin (VITAMIN B12) injection 1,000 mcg  1,000 mcg Intramuscular Q30 days Laural Benes, Megan P, DO   1,000 mcg at 07/01/23 1603    Allergies:   Patient has no known allergies.    Social History:   reports that he quit smoking about 44 years ago. His smoking use included cigarettes. He has never used smokeless tobacco. He reports current alcohol use. He reports that he does not use drugs.   Family History:  family history includes Obesity in his sister; Tremor in his paternal grandmother; Tuberculosis in his mother.    ROS:     Review of Systems  Constitutional: Negative.   HENT: Negative.    Eyes: Negative.   Respiratory: Negative.    Gastrointestinal: Negative.   Genitourinary: Negative.   Musculoskeletal: Negative.   Skin: Negative.   Neurological: Negative.   Endo/Heme/Allergies: Negative.   Psychiatric/Behavioral: Negative.    All other systems reviewed and are negative.     All other systems are reviewed and negative.    PHYSICAL EXAM: VS:  BP (!) 150/80  Pulse 80   Ht 5\' 9"  (1.753 m)   Wt 163 lb 6.4 oz (74.1 kg)   SpO2 90%   BMI 24.13 kg/m  , BMI Body mass index is 24.13 kg/m. Last weight:  Wt Readings from Last 3 Encounters:  09/30/23 163 lb 6.4 oz (74.1 kg)  09/20/23 162 lb 9.6 oz (73.8 kg)  08/30/23 164 lb (74.4 kg)     Physical Exam Vitals reviewed.  Constitutional:      Appearance: Normal appearance. He is normal weight.  HENT:     Head: Normocephalic.     Nose: Nose normal.     Mouth/Throat:     Mouth: Mucous membranes are moist.  Eyes:     Pupils: Pupils are equal, round, and reactive to light.  Cardiovascular:     Rate and Rhythm: Normal rate and regular rhythm.     Pulses: Normal pulses.     Heart sounds: Normal heart sounds.  Pulmonary:     Effort: Pulmonary effort is normal.  Abdominal:     General: Abdomen is flat. Bowel sounds  are normal.  Musculoskeletal:        General: Normal range of motion.     Cervical back: Normal range of motion.  Skin:    General: Skin is warm.  Neurological:     General: No focal deficit present.     Mental Status: He is alert.  Psychiatric:        Mood and Affect: Mood normal.       EKG:   Recent Labs: 09/20/2023: ALT 25; BUN 12; Creatinine, Ser 1.16; Hemoglobin 14.2; Platelets 202; Potassium 4.2; Sodium 142; TSH 1.510    Lipid Panel    Component Value Date/Time   CHOL 143 07/01/2023 1605   TRIG 143 07/01/2023 1605   HDL 42 07/01/2023 1605   LDLCALC 76 07/01/2023 1605      Other studies Reviewed: Additional studies/ records that were reviewed today include:  Review of the above records demonstrates:       No data to display            ASSESSMENT AND PLAN:    ICD-10-CM   1. Atrial fibrillation with slow ventricular response (HCC)  I48.91 amLODipine (NORVASC) 5 MG tablet   Heart rate much better,80/min    2. Primary hypertension  I10 amLODipine (NORVASC) 5 MG tablet   Repeat BP 160/75, will add amlodapine 5 mg as increasing from losartan 50 to 100 mg a month ago has not normalized BP yet.    3. Benign hypertensive renal disease  I12.9 amLODipine (NORVASC) 5 MG tablet    4. Mixed hyperlipidemia  E78.2 amLODipine (NORVASC) 5 MG tablet    5. Nonrheumatic mitral valve regurgitation  I34.0 amLODipine (NORVASC) 5 MG tablet    6. Nonrheumatic aortic valve insufficiency  I35.1 amLODipine (NORVASC) 5 MG tablet       Problem List Items Addressed This Visit       Cardiovascular and Mediastinum   Atrial fibrillation with slow ventricular response (HCC) - Primary   Relevant Medications   amLODipine (NORVASC) 5 MG tablet   HTN (hypertension)   Relevant Medications   amLODipine (NORVASC) 5 MG tablet     Genitourinary   Benign hypertensive renal disease   Relevant Medications   amLODipine (NORVASC) 5 MG tablet     Other   Mixed hyperlipidemia    Relevant Medications   amLODipine (NORVASC) 5 MG tablet   Other Visit Diagnoses  Nonrheumatic mitral valve regurgitation       Relevant Medications   amLODipine (NORVASC) 5 MG tablet   Nonrheumatic aortic valve insufficiency       Relevant Medications   amLODipine (NORVASC) 5 MG tablet          Disposition:   Return in about 3 months (around 12/31/2023).    Total time spent: 30 minutes  Signed,  Adrian Blackwater, MD  09/30/2023 9:36 AM    [facility]

## 2023-09-30 NOTE — Assessment & Plan Note (Signed)
Just had amlodipine added by cardiology. Will recheck in 1 month. Call with any concerns.

## 2023-10-03 DIAGNOSIS — R404 Transient alteration of awareness: Secondary | ICD-10-CM | POA: Diagnosis not present

## 2023-10-03 DIAGNOSIS — R42 Dizziness and giddiness: Secondary | ICD-10-CM | POA: Diagnosis not present

## 2023-10-03 DIAGNOSIS — R413 Other amnesia: Secondary | ICD-10-CM | POA: Diagnosis not present

## 2023-10-05 ENCOUNTER — Other Ambulatory Visit: Payer: Self-pay | Admitting: Student

## 2023-10-05 DIAGNOSIS — R404 Transient alteration of awareness: Secondary | ICD-10-CM

## 2023-10-05 DIAGNOSIS — R413 Other amnesia: Secondary | ICD-10-CM

## 2023-10-05 DIAGNOSIS — R42 Dizziness and giddiness: Secondary | ICD-10-CM

## 2023-10-19 ENCOUNTER — Ambulatory Visit
Admission: RE | Admit: 2023-10-19 | Discharge: 2023-10-19 | Disposition: A | Discharge status: Home / Self Care | Payer: Medicare Other | Source: Ambulatory Visit | Attending: Student | Admitting: Student

## 2023-10-19 DIAGNOSIS — R404 Transient alteration of awareness: Secondary | ICD-10-CM

## 2023-10-19 DIAGNOSIS — R42 Dizziness and giddiness: Secondary | ICD-10-CM

## 2023-10-19 DIAGNOSIS — R410 Disorientation, unspecified: Secondary | ICD-10-CM | POA: Diagnosis not present

## 2023-10-19 DIAGNOSIS — R251 Tremor, unspecified: Secondary | ICD-10-CM | POA: Diagnosis not present

## 2023-10-19 DIAGNOSIS — R413 Other amnesia: Secondary | ICD-10-CM

## 2023-10-24 ENCOUNTER — Other Ambulatory Visit: Payer: Self-pay | Admitting: Family Medicine

## 2023-10-25 NOTE — Telephone Encounter (Signed)
Metoprolol-08/16/23 #90 Simvastatin- 07/01/23 #90 1RF Requested Prescriptions  Pending Prescriptions Disp Refills   metoprolol succinate (TOPROL-XL) 50 MG 24 hr tablet [Pharmacy Med Name: METOPROLOL SUCC ER 50 MG TAB] 90 tablet 0    Sig: Take 1 tablet (50 mg total) by mouth daily.     Cardiovascular:  Beta Blockers Failed - 10/24/2023  9:53 AM      Failed - Last BP in normal range    BP Readings from Last 1 Encounters:  09/30/23 (!) 178/73         Passed - Last Heart Rate in normal range    Pulse Readings from Last 1 Encounters:  09/30/23 (!) 57         Passed - Valid encounter within last 6 months    Recent Outpatient Visits           3 weeks ago Primary hypertension   [place] [facility] [place], [place], DO   1 month ago Confusion   [place] [facility] [place], [place], DO   1 month ago Primary hypertension   [place] [facility] [place], Megan P, DO   3 months ago Atrial fibrillation, chronic [facility])   [place] [facility] [place], Megan P, DO   5 months ago Acute confusion   [place] Dorcas Carrow, DO       Future Appointments             In 6 days Dorcas Carrow, DO [place] [facility]   In 2 months Laurier Nancy, MD [facility]             simvastatin (ZOCOR) 20 MG tablet [Pharmacy Med Name: SIMVASTATIN 20 MG TABLET] 90 tablet 0    Sig: Take 1 tablet (20 mg total) by mouth daily.     Cardiovascular:  Antilipid - Statins Failed - 10/24/2023  9:53 AM      Failed - Lipid Panel in normal range within the last 12 months    Cholesterol, Total  Date Value Ref Range Status  07/01/2023 143 100 - 199 mg/dL Final   LDL Chol Calc (NIH)  Date Value Ref Range Status  07/01/2023 76 0 - 99 mg/dL Final   HDL  Date Value Ref Range Status  07/01/2023 42 >39 mg/dL Final   Triglycerides  Date Value Ref Range Status  07/01/2023 143 0  - 149 mg/dL Final         Passed - Patient is not pregnant      Passed - Valid encounter within last 12 months    Recent Outpatient Visits           3 weeks ago Primary hypertension   [place] [facility] [place], [place], DO   1 month ago Confusion   [place] [facility] [place], [place], DO   1 month ago Primary hypertension   [place] [place], Megan P, DO   3 months ago Atrial fibrillation, chronic [facility])   [place] [facility] [place], Megan P, DO   5 months ago Acute confusion   [place] [facility] Dorcas Carrow, DO       Future Appointments             In 6 days Dorcas Carrow, DO [place] [facility]   In 2 months Laurier Nancy, MD  [facility]

## 2023-10-26 ENCOUNTER — Other Ambulatory Visit: Payer: Self-pay | Admitting: Family Medicine

## 2023-10-26 DIAGNOSIS — R413 Other amnesia: Secondary | ICD-10-CM | POA: Diagnosis not present

## 2023-10-26 DIAGNOSIS — R55 Syncope and collapse: Secondary | ICD-10-CM | POA: Diagnosis not present

## 2023-10-26 DIAGNOSIS — R404 Transient alteration of awareness: Secondary | ICD-10-CM | POA: Diagnosis not present

## 2023-10-26 DIAGNOSIS — R42 Dizziness and giddiness: Secondary | ICD-10-CM | POA: Diagnosis not present

## 2023-10-26 NOTE — Telephone Encounter (Signed)
Prescription Request  10/26/2023  LOV: 09/30/2023  What is the name of the medication or equipment? simvastatin (ZOCOR) 20 MG tablet   Have you contacted your pharmacy to request a refill? Yes   Which pharmacy would you like this sent to?    [facility] - [place], [place] - [address] [place] [address] Phone: [phone] Fax: [phone]   Patient notified that their request is being sent to the clinical staff for review and that they should receive a response within 2 business days.   Please advise at Mobile [phone] (mobile)   Patient's wife came by requesting someone call her in reference to refilling this medication.

## 2023-10-26 NOTE — Telephone Encounter (Signed)
Spoke with Vernona Rieger at [facility] and was informed that patient does not need any Metoprolol refills, but he is due for his Simvastatin prescription. Please advise?

## 2023-10-26 NOTE — Telephone Encounter (Unsigned)
Copied from CRM [id number]. Topic: General - Other >> Oct 26, 2023 11:27 AM Carlos Richard wrote: Reason for CRM: The patient's wife has called regarding their previously submitted refill requests of their Metoprolol and Simvastatin prescriptions  Please contact the patient's wife further when possible

## 2023-10-27 MED ORDER — SIMVASTATIN 20 MG PO TABS: 20.0000 mg | ORAL_TABLET | Freq: Every day | ORAL | 1 refills | Status: DC

## 2023-10-31 ENCOUNTER — Encounter: Payer: Self-pay | Admitting: Family Medicine

## 2023-10-31 ENCOUNTER — Ambulatory Visit (INDEPENDENT_AMBULATORY_CARE_PROVIDER_SITE_OTHER): Payer: Medicare Other | Admitting: Family Medicine

## 2023-10-31 VITALS — BP 127/75 | HR 70 | Wt 162.2 lb

## 2023-10-31 DIAGNOSIS — I129 Hypertensive chronic kidney disease with stage 1 through stage 4 chronic kidney disease, or unspecified chronic kidney disease: Secondary | ICD-10-CM | POA: Diagnosis not present

## 2023-10-31 DIAGNOSIS — E538 Deficiency of other specified B group vitamins: Secondary | ICD-10-CM

## 2023-10-31 MED ORDER — TRIAMCINOLONE ACETONIDE 0.1 % EX CREA: 1.0000 | TOPICAL_CREAM | Freq: Two times a day (BID) | CUTANEOUS | 1 refills | Status: DC

## 2023-10-31 NOTE — Assessment & Plan Note (Signed)
Under good control on current regimen. Continue current regimen. Continue to monitor. Call with any concerns. Refills given. Labs drawn today.   

## 2023-10-31 NOTE — Progress Notes (Signed)
BP 127/75   Pulse 70   Wt 162 lb 3.2 oz (73.6 kg)   SpO2 93%   BMI 23.95 kg/m    Subjective:    Patient ID: Carlos Richard, male    DOB: 25-Feb-1932, 89 y.o.   MRN: [id number]  HPI: [name] is a 89 y.o. male  Chief Complaint  Patient presents with   Hypertension    Patient wife says thus far, patient BP readings have been good.    HYPERTENSION  Hypertension status: better  Satisfied with current treatment? yes Duration of hypertension: chronic BP monitoring frequency:  not checking BP medication side effects:  no Medication compliance: excellent compliance Previous BP meds: lasix, losartan, metoprolol, amlodipine Aspirin: no Recurrent headaches: no Visual changes: no Palpitations: no Dyspnea: no Chest pain: no Lower extremity edema: no Dizzy/lightheaded: no  Relevant past medical, surgical, family and social history reviewed and updated as indicated. Interim medical history since our last visit reviewed. Allergies and medications reviewed and updated.  Review of Systems  Constitutional: Negative.   Respiratory: Negative.    Cardiovascular: Negative.   Musculoskeletal: Negative.   Psychiatric/Behavioral: Negative.      Per HPI unless specifically indicated above     Objective:    BP 127/75   Pulse 70   Wt 162 lb 3.2 oz (73.6 kg)   SpO2 93%   BMI 23.95 kg/m   Wt Readings from Last 3 Encounters:  10/31/23 162 lb 3.2 oz (73.6 kg)  09/30/23 163 lb 12.8 oz (74.3 kg)  09/30/23 163 lb 6.4 oz (74.1 kg)    Physical Exam Vitals and nursing note reviewed.  Constitutional:      General: He is not in acute distress.    Appearance: Normal appearance. He is not ill-appearing, toxic-appearing or diaphoretic.  HENT:     Head: Normocephalic and atraumatic.     Right Ear: External ear normal.     Left Ear: External ear normal.     Nose: Nose normal.     Mouth/Throat:     Mouth: Mucous membranes are moist.     Pharynx: Oropharynx is clear.  Eyes:      General: No scleral icterus.       Right eye: No discharge.        Left eye: No discharge.     Extraocular Movements: Extraocular movements intact.     Conjunctiva/sclera: Conjunctivae normal.     Pupils: Pupils are equal, round, and reactive to light.  Cardiovascular:     Rate and Rhythm: Normal rate and regular rhythm.     Pulses: Normal pulses.     Heart sounds: Normal heart sounds. No murmur heard.    No friction rub. No gallop.  Pulmonary:     Effort: Pulmonary effort is normal. No respiratory distress.     Breath sounds: Normal breath sounds. No stridor. No wheezing, rhonchi or rales.  Chest:     Chest wall: No tenderness.  Musculoskeletal:        General: Normal range of motion.     Cervical back: Normal range of motion and neck supple.  Skin:    General: Skin is warm and dry.     Capillary Refill: Capillary refill takes less than 2 seconds.     Coloration: Skin is not jaundiced or pale.     Findings: No bruising, erythema, lesion or rash.  Neurological:     General: No focal deficit present.     Mental Status: He is  alert and oriented to person, place, and time. Mental status is at baseline.  Psychiatric:        Mood and Affect: Mood normal.        Behavior: Behavior normal.        Thought Content: Thought content normal.        Judgment: Judgment normal.     Results for orders placed or performed in visit on 09/20/23  Microscopic Examination   Collection Time: 09/20/23 10:54 AM   Urine  Result Value Ref Range   WBC, UA 0-5 0 - 5 /hpf   RBC, Urine 0-2 0 - 2 /hpf   Epithelial Cells (non renal) 0-10 0 - 10 /hpf   Bacteria, UA None seen None seen/Few  CBC with Differential/Platelet   Collection Time: 09/20/23 10:54 AM  Result Value Ref Range   WBC 9.5 3.4 - 10.8 x10E3/uL   RBC 4.64 4.14 - 5.80 x10E6/uL   Hemoglobin 14.2 13.0 - 17.7 g/dL   Hematocrit 81.1 91.4 - 51.0 %   MCV 94 79 - 97 fL   MCH 30.6 26.6 - 33.0 pg   MCHC 32.4 31.5 - 35.7 g/dL   RDW 78.2 95.6  - 21.3 %   Platelets 202 150 - 450 x10E3/uL   Neutrophils 51 Not Estab. %   Lymphs 37 Not Estab. %   Monocytes 9 Not Estab. %   Eos 2 Not Estab. %   Basos 1 Not Estab. %   Neutrophils Absolute 4.9 1.4 - 7.0 x10E3/uL   Lymphocytes Absolute 3.5 (H) 0.7 - 3.1 x10E3/uL   Monocytes Absolute 0.8 0.1 - 0.9 x10E3/uL   EOS (ABSOLUTE) 0.2 0.0 - 0.4 x10E3/uL   Basophils Absolute 0.1 0.0 - 0.2 x10E3/uL   Immature Granulocytes 0 Not Estab. %   Immature Grans (Abs) 0.0 0.0 - 0.1 x10E3/uL  Comprehensive metabolic panel   Collection Time: 09/20/23 10:54 AM  Result Value Ref Range   Glucose 99 70 - 99 mg/dL   BUN 12 8 - 27 mg/dL   Creatinine, Ser 0.86 0.76 - 1.27 mg/dL   eGFR 60 >57 QI/ONG/2.95   BUN/Creatinine Ratio 10 10 - 24   Sodium 142 134 - 144 mmol/L   Potassium 4.2 3.5 - 5.2 mmol/L   Chloride 103 96 - 106 mmol/L   CO2 25 20 - 29 mmol/L   Calcium 9.5 8.6 - 10.2 mg/dL   Total Protein 6.5 6.0 - 8.5 g/dL   Albumin 4.2 3.7 - 4.7 g/dL   Globulin, Total 2.3 1.5 - 4.5 g/dL   Bilirubin Total 0.5 0.0 - 1.2 mg/dL   Alkaline Phosphatase 72 44 - 121 IU/L   AST 28 0 - 40 IU/L   ALT 25 0 - 44 IU/L  B12   Collection Time: 09/20/23 10:54 AM  Result Value Ref Range   Vitamin B-12 663 232 - 1,245 pg/mL  TSH   Collection Time: 09/20/23 10:54 AM  Result Value Ref Range   TSH 1.510 0.450 - 4.500 uIU/mL  VITAMIN D 25 Hydroxy (Vit-D Deficiency, Fractures)   Collection Time: 09/20/23 10:54 AM  Result Value Ref Range   Vit D, 25-Hydroxy 40.3 30.0 - 100.0 ng/mL  Urinalysis, Routine w reflex microscopic   Collection Time: 09/20/23 10:54 AM  Result Value Ref Range   Specific Gravity, UA 1.015 1.005 - 1.030   pH, UA 6.0 5.0 - 7.5   Color, UA Yellow Yellow   Appearance Ur Clear Clear   Leukocytes,UA Negative Negative  Protein,UA Negative Negative/Trace   Glucose, UA Negative Negative   Ketones, UA Negative Negative   RBC, UA Trace (A) Negative   Bilirubin, UA Negative Negative   Urobilinogen, Ur  0.2 0.2 - 1.0 mg/dL   Nitrite, UA Negative Negative   Microscopic Examination See below:       Assessment & Plan:   Problem List Items Addressed This Visit       Genitourinary   Benign hypertensive renal disease - Primary   Under good control on current regimen. Continue current regimen. Continue to monitor. Call with any concerns. Refills given. Labs drawn today.       Relevant Orders   Basic metabolic panel     Other   B12 deficiency   B12 shot given today.        Follow up plan: Return 1 and 2 month nurse only visits for b12 shots, 3 month visit with me.

## 2023-10-31 NOTE — Assessment & Plan Note (Signed)
B12 shot given today  

## 2023-11-01 LAB — BASIC METABOLIC PANEL
BUN/Creatinine Ratio: 10 (ref 10–24)
BUN: 11 mg/dL (ref 8–27)
CO2: 23 mmol/L (ref 20–29)
Calcium: 9.3 mg/dL (ref 8.6–10.2)
Chloride: 104 mmol/L (ref 96–106)
Creatinine, Ser: 1.12 mg/dL (ref 0.76–1.27)
Glucose: 89 mg/dL (ref 70–99)
Potassium: 4 mmol/L (ref 3.5–5.2)
Sodium: 145 mmol/L — ABNORMAL HIGH (ref 134–144)
eGFR: 63 mL/min/{1.73_m2} (ref >59)

## 2023-11-15 ENCOUNTER — Other Ambulatory Visit: Payer: Self-pay | Admitting: Family Medicine

## 2023-11-18 ENCOUNTER — Other Ambulatory Visit: Payer: Self-pay | Admitting: Family Medicine

## 2023-11-18 ENCOUNTER — Telehealth: Payer: Self-pay | Admitting: Family Medicine

## 2023-11-18 MED ORDER — METOPROLOL SUCCINATE ER 50 MG PO TB24: 50.0000 mg | ORAL_TABLET | Freq: Every day | ORAL | 0 refills | Status: DC

## 2023-11-18 NOTE — Telephone Encounter (Signed)
 Copied from CRM [id number]. Topic: General - Inquiry >> Nov 18, 2023 11:01 AM Haroldine Laws wrote: Reason for CRM: pt is checking on his Metoprolol refill.  He is almost out.

## 2023-11-21 ENCOUNTER — Other Ambulatory Visit: Payer: Self-pay | Admitting: Family Medicine

## 2023-11-21 NOTE — Telephone Encounter (Signed)
 Requested Prescriptions  Pending Prescriptions Disp Refills   losartan  (COZAAR ) 100 MG tablet [Pharmacy Med Name: LOSARTAN  POTASSIUM 100 MG TAB] 90 tablet 0    Sig: Take 1 tablet (100 mg total) by mouth daily.     Cardiovascular:  Angiotensin Receptor Blockers Passed - 11/21/2023 12:36 PM      Passed - Cr in normal range and within 180 days    Creatinine  Date Value Ref Range Status  11/13/2013 1.72 (H) 0.60 - 1.30 mg/dL Final   Creatinine, Ser  Date Value Ref Range Status  10/31/2023 1.12 0.76 - 1.27 mg/dL Final         Passed - K in normal range and within 180 days    Potassium  Date Value Ref Range Status  10/31/2023 4.0 3.5 - 5.2 mmol/L Final  11/13/2013 3.6 3.5 - 5.1 mmol/L Final         Passed - Patient is not pregnant      Passed - Last BP in normal range    BP Readings from Last 1 Encounters:  10/31/23 127/75         Passed - Valid encounter within last 6 months    Recent Outpatient Visits           3 weeks ago Benign hypertensive renal disease   [place] [facility] [place], Megan P, DO   1 month ago Primary hypertension   [place] [facility] [place], [place], DO   2 months ago Confusion   [place] [facility] [place], [place], DO   2 months ago Primary hypertension   [place] [facility] [place], [place], DO   4 months ago Atrial fibrillation, chronic [facility])   [place] Vicci Duwaine SQUIBB, DO       Future Appointments             In 1 month Fernand Denyse LABOR, MD [facility]   In 2 months Vicci, Duwaine SQUIBB, DO [place] [facility]

## 2023-12-01 ENCOUNTER — Ambulatory Visit (INDEPENDENT_AMBULATORY_CARE_PROVIDER_SITE_OTHER): Payer: Medicare Other

## 2023-12-01 DIAGNOSIS — E538 Deficiency of other specified B group vitamins: Secondary | ICD-10-CM

## 2023-12-15 ENCOUNTER — Other Ambulatory Visit: Payer: Self-pay

## 2023-12-20 ENCOUNTER — Other Ambulatory Visit: Payer: Self-pay

## 2023-12-20 MED ORDER — XARELTO 15 MG PO TABS: 15.0000 mg | ORAL_TABLET | Freq: Every day | ORAL | 3 refills | Status: DC

## 2023-12-22 ENCOUNTER — Other Ambulatory Visit: Payer: Self-pay | Admitting: Family Medicine

## 2023-12-23 NOTE — Telephone Encounter (Signed)
 Requested Prescriptions  Pending Prescriptions Disp Refills   fluticasone -salmeterol (ADVAIR) 250-50 MCG/ACT AEPB [Pharmacy Med Name: WIXELA 250-50 INHUB] 180 each 0    Sig: INHALE 1 PUFF INTO THE LUNGS TWICE DAILY     Pulmonology:  Combination Products Passed - 12/23/2023 10:51 AM      Passed - Valid encounter within last 12 months    Recent Outpatient Visits           1 month ago Benign hypertensive renal disease   [place] [facility] [place], Megan P, DO   2 months ago Primary hypertension   [place] [facility] [place], [place], DO   3 months ago Confusion   [place] [facility] [place], [place], DO   3 months ago Primary hypertension   [place] [facility] [place], [place], DO   5 months ago Atrial fibrillation, chronic [facility])   [place] [facility] [place], Duwaine SQUIBB, DO       Future Appointments             In 1 week Fernand Denyse LABOR, MD [facility]   In 1 month Vicci, Duwaine SQUIBB, DO [place] [facility]

## 2023-12-30 ENCOUNTER — Encounter: Payer: Self-pay | Admitting: Cardiovascular Disease

## 2023-12-30 ENCOUNTER — Ambulatory Visit: Payer: Medicare Other | Admitting: Cardiovascular Disease

## 2023-12-30 VITALS — BP 118/72 | HR 70 | Ht 69.0 in | Wt 157.0 lb

## 2023-12-30 DIAGNOSIS — I10 Essential (primary) hypertension: Secondary | ICD-10-CM

## 2023-12-30 DIAGNOSIS — I351 Nonrheumatic aortic (valve) insufficiency: Secondary | ICD-10-CM

## 2023-12-30 DIAGNOSIS — I4891 Unspecified atrial fibrillation: Secondary | ICD-10-CM | POA: Diagnosis not present

## 2023-12-30 DIAGNOSIS — E782 Mixed hyperlipidemia: Secondary | ICD-10-CM

## 2023-12-30 DIAGNOSIS — N1831 Chronic kidney disease, stage 3a: Secondary | ICD-10-CM

## 2023-12-30 DIAGNOSIS — I129 Hypertensive chronic kidney disease with stage 1 through stage 4 chronic kidney disease, or unspecified chronic kidney disease: Secondary | ICD-10-CM | POA: Diagnosis not present

## 2023-12-30 DIAGNOSIS — I340 Nonrheumatic mitral (valve) insufficiency: Secondary | ICD-10-CM

## 2023-12-30 NOTE — Progress Notes (Signed)
Cardiology Office Note   Date:  12/30/2023   ID:  BRITTAN MAPEL, DOB January 14, 1932, MRN [id number]  PCP:  Dorcas Carrow, DO  Cardiologist:  Adrian Blackwater, MD      History of Present Illness: Carlos Richard is a 89 y.o. male who presents for  Chief Complaint  Patient presents with   Follow-up    3 month follow up    Has SOB, but no chest pain      Past Medical History:  Diagnosis Date   Atrial fibrillation (HCC)    COPD (chronic obstructive pulmonary disease) (HCC)    Hypertension    Near syncope    Tremors of nervous system      Past Surgical History:  Procedure Laterality Date   BACK SURGERY     BRAIN SURGERY     stimulator placement for tremors   EYE SURGERY Left 2020   cataract surgery nov    EYE SURGERY Right 2020   cataract surgery dec     Current Outpatient Medications  Medication Sig Dispense Refill   allopurinol (ZYLOPRIM) 100 MG tablet Take 1 tablet (100 mg total) by mouth daily. Monday, Wednesday, Friday 90 tablet 1   amLODipine (NORVASC) 5 MG tablet Take 1 tablet (5 mg total) by mouth daily. 30 tablet 11   fluticasone-salmeterol (ADVAIR) 250-50 MCG/ACT AEPB INHALE 1 PUFF INTO THE LUNGS TWICE DAILY 180 each 0   furosemide (LASIX) 20 MG tablet TAKE 1 TABLET DAILY. 90 tablet 0   losartan (COZAAR) 100 MG tablet Take 1 tablet (100 mg total) by mouth daily. 90 tablet 0   magnesium oxide (MAG-OX) 400 MG tablet Take 400 mg by mouth daily.     memantine (NAMENDA) 5 MG tablet Take 5mg  once daily for one week, then increase to 5mg  twice daily for memory     metoprolol succinate (TOPROL-XL) 50 MG 24 hr tablet Take 1 tablet (50 mg total) by mouth daily. 90 tablet 0   metoprolol tartrate (LOPRESSOR) 50 MG tablet Take by mouth.     Multiple Vitamins-Minerals (ZINC PO) Take 1 tablet by mouth daily.     simvastatin (ZOCOR) 20 MG tablet Take 1 tablet (20 mg total) by mouth daily. 90 tablet 1   triamcinolone cream (KENALOG) 0.1 % Apply 1 Application  topically 2 (two) times daily. 90 g 1   XARELTO 15 MG TABS tablet Take 1 tablet (15 mg total) by mouth daily with supper. 90 tablet 3   Zinc 50 MG TABS Take 50 mg by mouth daily.      Current Facility-Administered Medications  Medication Dose Route Frequency Provider Last Rate Last Admin   cyanocobalamin (VITAMIN B12) injection 1,000 mcg  1,000 mcg Intramuscular Q30 days Laural Benes, Megan P, DO   1,000 mcg at 12/01/23 4098    Allergies:   Patient has no known allergies.    Social History:   reports that he quit smoking about 45 years ago. His smoking use included cigarettes. He has never used smokeless tobacco. He reports current alcohol use. He reports that he does not use drugs.   Family History:  family history includes Obesity in his sister; Tremor in his paternal grandmother; Tuberculosis in his mother.    ROS:     Review of Systems  Constitutional: Negative.   HENT: Negative.    Eyes: Negative.   Respiratory: Negative.    Gastrointestinal: Negative.   Genitourinary: Negative.   Musculoskeletal: Negative.   Skin: Negative.   Neurological:  Negative.   Endo/Heme/Allergies: Negative.   Psychiatric/Behavioral: Negative.    All other systems reviewed and are negative.     All other systems are reviewed and negative.    PHYSICAL EXAM: VS:  BP 118/72   Pulse 70   Ht 5\' 9"  (1.753 m)   Wt 157 lb (71.2 kg)   SpO2 95%   BMI 23.18 kg/m  , BMI Body mass index is 23.18 kg/m. Last weight:  Wt Readings from Last 3 Encounters:  12/30/23 157 lb (71.2 kg)  10/31/23 162 lb 3.2 oz (73.6 kg)  09/30/23 163 lb 12.8 oz (74.3 kg)     Physical Exam Vitals reviewed.  Constitutional:      Appearance: Normal appearance. He is normal weight.  HENT:     Head: Normocephalic.     Nose: Nose normal.     Mouth/Throat:     Mouth: Mucous membranes are moist.  Eyes:     Pupils: Pupils are equal, round, and reactive to light.  Cardiovascular:     Rate and Rhythm: Normal rate and regular  rhythm.     Pulses: Normal pulses.     Heart sounds: Normal heart sounds.  Pulmonary:     Effort: Pulmonary effort is normal.  Abdominal:     General: Abdomen is flat. Bowel sounds are normal.  Musculoskeletal:        General: Normal range of motion.     Cervical back: Normal range of motion.  Skin:    General: Skin is warm.  Neurological:     General: No focal deficit present.     Mental Status: He is alert.  Psychiatric:        Mood and Affect: Mood normal.       EKG:   Recent Labs: 09/20/2023: ALT 25; Hemoglobin 14.2; Platelets 202; TSH 1.510 10/31/2023: BUN 11; Creatinine, Ser 1.12; Potassium 4.0; Sodium 145    Lipid Panel    Component Value Date/Time   CHOL 143 07/01/2023 1605   TRIG 143 07/01/2023 1605   HDL 42 07/01/2023 1605   LDLCALC 76 07/01/2023 1605      Other studies Reviewed: Additional studies/ records that were reviewed today include:  Review of the above records demonstrates:       No data to display            ASSESSMENT AND PLAN:    ICD-10-CM   1. Atrial fibrillation with slow ventricular response (HCC)  I48.91    At this time heart rate good 80/min    2. Primary hypertension  I10     3. 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 Mixed hyperlipidemia  E78.2     5. Nonrheumatic mitral valve regurgitation  I34.0     6. Nonrheumatic aortic valve insufficiency  I35.1     7. Chronic kidney disease, stage 3a (HCC)  N18.31        Problem List Items Addressed This Visit       Cardiovascular and Mediastinum   Atrial fibrillation with slow ventricular response (HCC) - Primary   HTN (hypertension)     Genitourinary   Benign hypertensive renal disease   Chronic kidney disease, stage 3a (HCC)     Other   Mixed hyperlipidemia   Other Visit Diagnoses       Nonrheumatic mitral valve regurgitation         Nonrheumatic aortic valve insufficiency  Disposition:   Return in about 3 months (around 03/28/2024).     Total time spent: 30 minutes  Signed,  Adrian Blackwater, MD  12/30/2023 9:39 AM    [facility]

## 2024-01-02 ENCOUNTER — Ambulatory Visit: Payer: Medicare Other

## 2024-01-03 ENCOUNTER — Ambulatory Visit: Payer: Medicare Other | Admitting: Cardiovascular Disease

## 2024-01-21 ENCOUNTER — Other Ambulatory Visit: Payer: Self-pay

## 2024-01-21 ENCOUNTER — Inpatient Hospital Stay

## 2024-01-21 ENCOUNTER — Emergency Department

## 2024-01-21 ENCOUNTER — Encounter: Admission: EM | Disposition: E | Payer: Self-pay | Source: Home / Self Care | Attending: Pulmonary Disease

## 2024-01-21 ENCOUNTER — Encounter: Payer: Self-pay | Admitting: Student in an Organized Health Care Education/Training Program

## 2024-01-21 ENCOUNTER — Inpatient Hospital Stay
Admission: EM | Admit: 2024-01-21 | Discharge: 2024-02-14 | DRG: 870 | Disposition: E | Attending: Pulmonary Disease | Admitting: Pulmonary Disease

## 2024-01-21 ENCOUNTER — Inpatient Hospital Stay
Admit: 2024-01-21 | Discharge: 2024-01-21 | Disposition: A | Discharge status: Home / Self Care | Attending: Student in an Organized Health Care Education/Training Program

## 2024-01-21 ENCOUNTER — Inpatient Hospital Stay: Admitting: Anesthesiology

## 2024-01-21 DIAGNOSIS — J189 Pneumonia, unspecified organism: Secondary | ICD-10-CM | POA: Diagnosis present

## 2024-01-21 DIAGNOSIS — G7281 Critical illness myopathy: Secondary | ICD-10-CM | POA: Diagnosis not present

## 2024-01-21 DIAGNOSIS — F0390 Unspecified dementia without behavioral disturbance: Secondary | ICD-10-CM | POA: Diagnosis present

## 2024-01-21 DIAGNOSIS — N401 Enlarged prostate with lower urinary tract symptoms: Secondary | ICD-10-CM | POA: Diagnosis present

## 2024-01-21 DIAGNOSIS — I468 Cardiac arrest due to other underlying condition: Secondary | ICD-10-CM | POA: Diagnosis present

## 2024-01-21 DIAGNOSIS — N170 Acute kidney failure with tubular necrosis: Secondary | ICD-10-CM | POA: Diagnosis not present

## 2024-01-21 DIAGNOSIS — J9601 Acute respiratory failure with hypoxia: Secondary | ICD-10-CM | POA: Diagnosis present

## 2024-01-21 DIAGNOSIS — D649 Anemia, unspecified: Secondary | ICD-10-CM | POA: Diagnosis present

## 2024-01-21 DIAGNOSIS — N320 Bladder-neck obstruction: Secondary | ICD-10-CM | POA: Diagnosis present

## 2024-01-21 DIAGNOSIS — J690 Pneumonitis due to inhalation of food and vomit: Secondary | ICD-10-CM | POA: Diagnosis present

## 2024-01-21 DIAGNOSIS — I213 ST elevation (STEMI) myocardial infarction of unspecified site: Secondary | ICD-10-CM | POA: Diagnosis present

## 2024-01-21 DIAGNOSIS — Z66 Do not resuscitate: Secondary | ICD-10-CM | POA: Diagnosis not present

## 2024-01-21 DIAGNOSIS — S72001A Fracture of unspecified part of neck of right femur, initial encounter for closed fracture: Secondary | ICD-10-CM | POA: Diagnosis not present

## 2024-01-21 DIAGNOSIS — I119 Hypertensive heart disease without heart failure: Secondary | ICD-10-CM | POA: Diagnosis present

## 2024-01-21 DIAGNOSIS — D696 Thrombocytopenia, unspecified: Secondary | ICD-10-CM | POA: Diagnosis present

## 2024-01-21 DIAGNOSIS — Z515 Encounter for palliative care: Secondary | ICD-10-CM

## 2024-01-21 DIAGNOSIS — R579 Shock, unspecified: Secondary | ICD-10-CM

## 2024-01-21 DIAGNOSIS — R338 Other retention of urine: Secondary | ICD-10-CM | POA: Diagnosis present

## 2024-01-21 DIAGNOSIS — R6521 Severe sepsis with septic shock: Secondary | ICD-10-CM | POA: Diagnosis present

## 2024-01-21 DIAGNOSIS — I10 Essential (primary) hypertension: Secondary | ICD-10-CM

## 2024-01-21 DIAGNOSIS — I469 Cardiac arrest, cause unspecified: Secondary | ICD-10-CM | POA: Diagnosis not present

## 2024-01-21 DIAGNOSIS — R569 Unspecified convulsions: Secondary | ICD-10-CM | POA: Diagnosis present

## 2024-01-21 DIAGNOSIS — Z7951 Long term (current) use of inhaled steroids: Secondary | ICD-10-CM

## 2024-01-21 DIAGNOSIS — G250 Essential tremor: Secondary | ICD-10-CM | POA: Diagnosis present

## 2024-01-21 DIAGNOSIS — E440 Moderate protein-calorie malnutrition: Secondary | ICD-10-CM | POA: Diagnosis present

## 2024-01-21 DIAGNOSIS — N179 Acute kidney failure, unspecified: Secondary | ICD-10-CM | POA: Diagnosis not present

## 2024-01-21 DIAGNOSIS — A419 Sepsis, unspecified organism: Secondary | ICD-10-CM | POA: Diagnosis present

## 2024-01-21 DIAGNOSIS — Z6823 Body mass index (BMI) 23.0-23.9, adult: Secondary | ICD-10-CM

## 2024-01-21 DIAGNOSIS — Z7901 Long term (current) use of anticoagulants: Secondary | ICD-10-CM

## 2024-01-21 DIAGNOSIS — E872 Acidosis, unspecified: Secondary | ICD-10-CM | POA: Diagnosis present

## 2024-01-21 DIAGNOSIS — Z79899 Other long term (current) drug therapy: Secondary | ICD-10-CM

## 2024-01-21 DIAGNOSIS — E785 Hyperlipidemia, unspecified: Secondary | ICD-10-CM | POA: Diagnosis present

## 2024-01-21 DIAGNOSIS — R296 Repeated falls: Secondary | ICD-10-CM | POA: Diagnosis present

## 2024-01-21 DIAGNOSIS — I080 Rheumatic disorders of both mitral and aortic valves: Secondary | ICD-10-CM | POA: Diagnosis present

## 2024-01-21 DIAGNOSIS — I480 Paroxysmal atrial fibrillation: Secondary | ICD-10-CM | POA: Diagnosis present

## 2024-01-21 DIAGNOSIS — I4891 Unspecified atrial fibrillation: Secondary | ICD-10-CM | POA: Diagnosis not present

## 2024-01-21 DIAGNOSIS — J440 Chronic obstructive pulmonary disease with acute lower respiratory infection: Secondary | ICD-10-CM | POA: Diagnosis present

## 2024-01-21 DIAGNOSIS — R55 Syncope and collapse: Secondary | ICD-10-CM | POA: Diagnosis present

## 2024-01-21 DIAGNOSIS — Z87891 Personal history of nicotine dependence: Secondary | ICD-10-CM

## 2024-01-21 DIAGNOSIS — Z831 Family history of other infectious and parasitic diseases: Secondary | ICD-10-CM

## 2024-01-21 DIAGNOSIS — R339 Retention of urine, unspecified: Secondary | ICD-10-CM

## 2024-01-21 DIAGNOSIS — S72011A Unspecified intracapsular fracture of right femur, initial encounter for closed fracture: Secondary | ICD-10-CM | POA: Diagnosis present

## 2024-01-21 DIAGNOSIS — G9341 Metabolic encephalopathy: Secondary | ICD-10-CM | POA: Diagnosis not present

## 2024-01-21 HISTORY — PX: LEFT HEART CATH AND CORONARY ANGIOGRAPHY: CATH118249

## 2024-01-21 HISTORY — PX: CORONARY/GRAFT ACUTE MI REVASCULARIZATION: CATH118305

## 2024-01-21 LAB — LACTIC ACID, PLASMA
Lactic Acid, Venous: 6.3 mmol/L (ref 0.5–1.9)
Lactic Acid, Venous: 6.5 mmol/L (ref 0.5–1.9)
Lactic Acid, Venous: 3.1 mmol/L (ref 0.5–1.9)
Lactic Acid, Venous: 2.6 mmol/L (ref 0.5–1.9)

## 2024-01-21 LAB — CBC
HCT: 41.9 % (ref 39.0–52.0)
HCT: 39.3 % (ref 39.0–52.0)
Hemoglobin: 13.3 g/dL (ref 13.0–17.0)
Hemoglobin: 13.2 g/dL (ref 13.0–17.0)
MCH: 30.2 pg (ref 26.0–34.0)
MCH: 31 pg (ref 26.0–34.0)
MCHC: 31.7 g/dL (ref 30.0–36.0)
MCHC: 33.6 g/dL (ref 30.0–36.0)
MCV: 95 fL (ref 80.0–100.0)
MCV: 92.3 fL (ref 80.0–100.0)
Platelets: 215 10*3/uL (ref 150–400)
Platelets: 196 10*3/uL (ref 150–400)
RBC: 4.41 MIL/uL (ref 4.22–5.81)
RBC: 4.26 MIL/uL (ref 4.22–5.81)
RDW: 13.1 % (ref 11.5–15.5)
RDW: 13.6 % (ref 11.5–15.5)
WBC: 14.9 10*3/uL — ABNORMAL HIGH (ref 4.0–10.5)
WBC: 24.7 10*3/uL — ABNORMAL HIGH (ref 4.0–10.5)
nRBC: 0 % (ref 0.0–0.2)
nRBC: 0 % (ref 0.0–0.2)

## 2024-01-21 LAB — HEPATIC FUNCTION PANEL
ALT: 104 U/L — ABNORMAL HIGH (ref 0–44)
AST: 105 U/L — ABNORMAL HIGH (ref 15–41)
Albumin: 2.9 g/dL — ABNORMAL LOW (ref 3.5–5.0)
Alkaline Phosphatase: 57 U/L (ref 38–126)
Bilirubin, Direct: 0.1 mg/dL (ref 0.0–0.2)
Indirect Bilirubin: 0.3 mg/dL (ref 0.3–0.9)
Total Bilirubin: 0.4 mg/dL (ref 0.0–1.2)
Total Protein: 5.4 g/dL — ABNORMAL LOW (ref 6.5–8.1)

## 2024-01-21 LAB — BLOOD GAS, ARTERIAL
Acid-Base Excess: 0.6 mmol/L (ref 0.0–2.0)
Acid-base deficit: 2.6 mmol/L — ABNORMAL HIGH (ref 0.0–2.0)
Bicarbonate: 26.6 mmol/L (ref 20.0–28.0)
Bicarbonate: 23.2 mmol/L (ref 20.0–28.0)
FIO2: 100 %
FIO2: 70 %
MECHVT: 450 mL
MECHVT: 400 mL
Mechanical Rate: 18
O2 Saturation: 96.2 %
O2 Saturation: 95.8 %
PEEP: 8 cmH2O
PEEP: 12 cmH2O
Patient temperature: 37
Patient temperature: 37
pCO2 arterial: 47 mmHg (ref 32–48)
pCO2 arterial: 43 mmHg (ref 32–48)
pH, Arterial: 7.36 (ref 7.35–7.45)
pH, Arterial: 7.34 — ABNORMAL LOW (ref 7.35–7.45)
pO2, Arterial: 77 mmHg — ABNORMAL LOW (ref 83–108)
pO2, Arterial: 76 mmHg — ABNORMAL LOW (ref 83–108)

## 2024-01-21 LAB — COMPREHENSIVE METABOLIC PANEL
ALT: 118 U/L — ABNORMAL HIGH (ref 0–44)
AST: 146 U/L — ABNORMAL HIGH (ref 15–41)
Albumin: 2.9 g/dL — ABNORMAL LOW (ref 3.5–5.0)
Alkaline Phosphatase: 57 U/L (ref 38–126)
Anion gap: 9 (ref 5–15)
BUN: 16 mg/dL (ref 8–23)
CO2: 21 mmol/L — ABNORMAL LOW (ref 22–32)
Calcium: 8.2 mg/dL — ABNORMAL LOW (ref 8.9–10.3)
Chloride: 109 mmol/L (ref 98–111)
Creatinine, Ser: 1.76 mg/dL — ABNORMAL HIGH (ref 0.61–1.24)
GFR, Estimated: 37 mL/min — ABNORMAL LOW (ref >60)
Glucose, Bld: 195 mg/dL — ABNORMAL HIGH (ref 70–99)
Potassium: 3.6 mmol/L (ref 3.5–5.1)
Sodium: 139 mmol/L (ref 135–145)
Total Bilirubin: 1.1 mg/dL (ref 0.0–1.2)
Total Protein: 5.3 g/dL — ABNORMAL LOW (ref 6.5–8.1)

## 2024-01-21 LAB — ECHOCARDIOGRAM COMPLETE
AR max vel: 3.01 cm2
AV Peak grad: 5.2 mmHg
Ao pk vel: 1.14 m/s
Area-P 1/2: 3.27 cm2
Height: 69.016 in
S' Lateral: 2.7 cm
Single Plane A4C EF: 57.9 %
Weight: 2511.48 [oz_av]

## 2024-01-21 LAB — GLUCOSE, CAPILLARY
Glucose-Capillary: 181 mg/dL — ABNORMAL HIGH (ref 70–99)
Glucose-Capillary: 224 mg/dL — ABNORMAL HIGH (ref 70–99)
Glucose-Capillary: 195 mg/dL — ABNORMAL HIGH (ref 70–99)
Glucose-Capillary: 142 mg/dL — ABNORMAL HIGH (ref 70–99)
Glucose-Capillary: 161 mg/dL — ABNORMAL HIGH (ref 70–99)

## 2024-01-21 LAB — URINALYSIS, COMPLETE (UACMP) WITH MICROSCOPIC
Bilirubin Urine: NEGATIVE
Glucose, UA: 50 mg/dL — AB
Ketones, ur: NEGATIVE mg/dL
Leukocytes,Ua: NEGATIVE
Nitrite: NEGATIVE
Protein, ur: 100 mg/dL — AB
Specific Gravity, Urine: 1.028 (ref 1.005–1.030)
pH: 6 (ref 5.0–8.0)

## 2024-01-21 LAB — HEPARIN LEVEL (UNFRACTIONATED): Heparin Unfractionated: >1.1 [IU]/mL — ABNORMAL HIGH (ref 0.30–0.70)

## 2024-01-21 LAB — STREP PNEUMONIAE URINARY ANTIGEN: Strep Pneumo Urinary Antigen: NEGATIVE

## 2024-01-21 LAB — T4, FREE: Free T4: 0.71 ng/dL (ref 0.61–1.12)

## 2024-01-21 LAB — BASIC METABOLIC PANEL
Anion gap: 23 — ABNORMAL HIGH (ref 5–15)
BUN: 13 mg/dL (ref 8–23)
CO2: 14 mmol/L — ABNORMAL LOW (ref 22–32)
Calcium: 11.2 mg/dL — ABNORMAL HIGH (ref 8.9–10.3)
Chloride: 102 mmol/L (ref 98–111)
Creatinine, Ser: 1.47 mg/dL — ABNORMAL HIGH (ref 0.61–1.24)
GFR, Estimated: 45 mL/min — ABNORMAL LOW (ref >60)
Glucose, Bld: 296 mg/dL — ABNORMAL HIGH (ref 70–99)
Potassium: 3.6 mmol/L (ref 3.5–5.1)
Sodium: 139 mmol/L (ref 135–145)

## 2024-01-21 LAB — POCT ACTIVATED CLOTTING TIME
Activated Clotting Time: 176 s
Activated Clotting Time: 222 s

## 2024-01-21 LAB — PROTIME-INR
INR: 2 — ABNORMAL HIGH (ref 0.8–1.2)
Prothrombin Time: 23 s — ABNORMAL HIGH (ref 11.4–15.2)

## 2024-01-21 LAB — COOXEMETRY PANEL
Carboxyhemoglobin: 1.3 % (ref 0.5–1.5)
Methemoglobin: <0.7 % (ref 0.0–1.5)
O2 Saturation: 59.9 %
Total hemoglobin: 13.8 g/dL (ref 12.0–16.0)
Total oxygen content: 58.9 %

## 2024-01-21 LAB — MRSA NEXT GEN BY PCR, NASAL: MRSA by PCR Next Gen: NOT DETECTED

## 2024-01-21 LAB — TSH: TSH: 2.726 u[IU]/mL (ref 0.350–4.500)

## 2024-01-21 LAB — HEMOGLOBIN A1C
Hgb A1c MFr Bld: 5.7 % — ABNORMAL HIGH (ref 4.8–5.6)
Mean Plasma Glucose: 116.89 mg/dL

## 2024-01-21 LAB — TROPONIN I (HIGH SENSITIVITY)
Troponin I (High Sensitivity): 16 ng/L (ref <18)
Troponin I (High Sensitivity): 482 ng/L (ref <18)
Troponin I (High Sensitivity): 1118 ng/L (ref <18)

## 2024-01-21 LAB — BRAIN NATRIURETIC PEPTIDE: B Natriuretic Peptide: 94.1 pg/mL (ref 0.0–100.0)

## 2024-01-21 LAB — MAGNESIUM
Magnesium: 2.9 mg/dL — ABNORMAL HIGH (ref 1.7–2.4)
Magnesium: 2.6 mg/dL — ABNORMAL HIGH (ref 1.7–2.4)

## 2024-01-21 LAB — CBG MONITORING, ED: Glucose-Capillary: 166 mg/dL — ABNORMAL HIGH (ref 70–99)

## 2024-01-21 LAB — SAMPLE TO BLOOD BANK

## 2024-01-21 LAB — APTT: aPTT: 124 s — ABNORMAL HIGH (ref 24–36)

## 2024-01-21 LAB — PHOSPHORUS: Phosphorus: 3.6 mg/dL (ref 2.5–4.6)

## 2024-01-21 LAB — CKMB (ARMC ONLY): CK, MB: 19 ng/mL — ABNORMAL HIGH (ref 0.5–5.0)

## 2024-01-21 LAB — PROCALCITONIN: Procalcitonin: <0.1 ng/mL

## 2024-01-21 SURGERY — CORONARY/GRAFT ACUTE MI REVASCULARIZATION
Anesthesia: Moderate Sedation

## 2024-01-21 MED ORDER — LIDOCAINE HCL 1 % IJ SOLN
INTRAMUSCULAR | Status: AC
  Filled 2024-01-21: qty 20

## 2024-01-21 MED ORDER — HYDROCORTISONE SOD SUC (PF) 100 MG IJ SOLR
100.0000 mg | Freq: Once | INTRAMUSCULAR | Status: DC
  Filled 2024-01-21: qty 2

## 2024-01-21 MED ORDER — VERAPAMIL HCL 2.5 MG/ML IV SOLN
INTRAVENOUS | Status: AC
  Filled 2024-01-21: qty 2

## 2024-01-21 MED ORDER — NOREPINEPHRINE 16 MG/250ML-% IV SOLN
0.0000 ug/min | INTRAVENOUS | Status: DC
Start: 2024-01-21 — End: 2024-01-26
  Administered 2024-01-21: 21 ug/min via INTRAVENOUS
  Administered 2024-01-21: 35 ug/min via INTRAVENOUS
  Administered 2024-01-22: 12 ug/min via INTRAVENOUS
  Administered 2024-01-23: 19 ug/min via INTRAVENOUS
  Administered 2024-01-24: 13 ug/min via INTRAVENOUS
  Filled 2024-01-21 (×6): qty 250

## 2024-01-21 MED ORDER — SODIUM CHLORIDE 0.9% FLUSH
10.0000 mL | Freq: Two times a day (BID) | INTRAVENOUS | Status: DC
Start: 2024-01-21 — End: 2024-01-26
  Administered 2024-01-21: 10 mL
  Administered 2024-01-21: 30 mL
  Administered 2024-01-22 – 2024-01-26 (×9): 10 mL

## 2024-01-21 MED ORDER — NOREPINEPHRINE 4 MG/250ML-% IV SOLN
0.0000 ug/min | INTRAVENOUS | Status: DC
  Administered 2024-01-21: 5 ug/min via INTRAVENOUS
  Filled 2024-01-21 (×2): qty 250

## 2024-01-21 MED ORDER — ATROPINE SULFATE 1 MG/ML IV SOLN
0.4000 mg | Freq: Once | INTRAVENOUS | Status: AC
  Administered 2024-01-21: 0.4 mg via INTRAVENOUS
  Filled 2024-01-21: qty 0.4

## 2024-01-21 MED ORDER — LEVETIRACETAM IN NACL 500 MG/100ML IV SOLN: 500.0000 mg | Freq: Two times a day (BID) | INTRAVENOUS | Status: DC

## 2024-01-21 MED ORDER — CHLORHEXIDINE GLUCONATE CLOTH 2 % EX PADS
6.0000 | MEDICATED_PAD | Freq: Every day | CUTANEOUS | Status: DC
  Administered 2024-01-21 – 2024-01-24 (×4): 6 via TOPICAL

## 2024-01-21 MED ORDER — FENTANYL 2500MCG IN NS 250ML (10MCG/ML) PREMIX INFUSION
25.0000 ug/h | INTRAVENOUS | Status: DC
Start: 2024-01-21 — End: 2024-01-26
  Administered 2024-01-21: 25 ug/h via INTRAVENOUS
  Administered 2024-01-23: 75 ug/h via INTRAVENOUS
  Administered 2024-01-24 – 2024-01-26 (×3): 50 ug/h via INTRAVENOUS
  Filled 2024-01-21 (×4): qty 250

## 2024-01-21 MED ORDER — MIDAZOLAM HCL 2 MG/2ML IJ SOLN
1.0000 mg | INTRAMUSCULAR | Status: DC | PRN
  Administered 2024-01-21 (×2): 2 mg via INTRAVENOUS
  Filled 2024-01-21 (×3): qty 2

## 2024-01-21 MED ORDER — HEPARIN (PORCINE) IN NACL 1000-0.9 UT/500ML-% IV SOLN
INTRAVENOUS | Status: AC
  Filled 2024-01-21: qty 1000

## 2024-01-21 MED ORDER — HEPARIN (PORCINE) IN NACL 1000-0.9 UT/500ML-% IV SOLN
INTRAVENOUS | Status: DC | PRN
Start: 2024-01-21 — End: 2024-01-21
  Administered 2024-01-21 (×2): 500 mL

## 2024-01-21 MED ORDER — FENTANYL CITRATE (PF) 100 MCG/2ML IJ SOLN: 25.0000 ug | Freq: Once | INTRAMUSCULAR | Status: DC

## 2024-01-21 MED ORDER — SODIUM CHLORIDE 0.9 % IV BOLUS
1000.0000 mL | Freq: Once | INTRAVENOUS | Status: AC
  Administered 2024-01-21: 1000 mL via INTRAVENOUS

## 2024-01-21 MED ORDER — PROPOFOL 1000 MG/100ML IV EMUL: 5.0000 ug/kg/min | INTRAVENOUS | Status: DC

## 2024-01-21 MED ORDER — SODIUM CHLORIDE 0.9 % WEIGHT BASED INFUSION
1.0000 mL/kg/h | INTRAVENOUS | Status: DC
  Administered 2024-01-21: 1 mL/kg/h via INTRAVENOUS

## 2024-01-21 MED ORDER — ASPIRIN 300 MG RE SUPP
300.0000 mg | Freq: Once | RECTAL | Status: AC
  Administered 2024-01-21: 300 mg via RECTAL
  Filled 2024-01-21: qty 1

## 2024-01-21 MED ORDER — DOCUSATE SODIUM 100 MG PO CAPS: 100.0000 mg | ORAL_CAPSULE | Freq: Two times a day (BID) | ORAL | Status: DC | PRN

## 2024-01-21 MED ORDER — SODIUM BICARBONATE 8.4 % IV SOLN
INTRAVENOUS | Status: AC | PRN
  Administered 2024-01-21: 100 meq via INTRAVENOUS

## 2024-01-21 MED ORDER — ROCURONIUM BROMIDE 10 MG/ML (PF) SYRINGE
PREFILLED_SYRINGE | INTRAVENOUS | Status: AC
  Filled 2024-01-21: qty 10

## 2024-01-21 MED ORDER — FAMOTIDINE 20 MG PO TABS: 20.0000 mg | ORAL_TABLET | Freq: Two times a day (BID) | ORAL | Status: DC

## 2024-01-21 MED ORDER — SODIUM BICARBONATE 8.4 % IV SOLN
INTRAVENOUS | Status: AC
  Filled 2024-01-21: qty 100

## 2024-01-21 MED ORDER — PROPOFOL 1000 MG/100ML IV EMUL
5.0000 ug/kg/min | INTRAVENOUS | Status: DC
  Administered 2024-01-23: 5 ug/kg/min via INTRAVENOUS
  Administered 2024-01-23: 25 ug/kg/min via INTRAVENOUS
  Administered 2024-01-24 (×2): 15 ug/kg/min via INTRAVENOUS
  Administered 2024-01-24: 25 ug/kg/min via INTRAVENOUS
  Administered 2024-01-25: 30 ug/kg/min via INTRAVENOUS
  Administered 2024-01-25: 15 ug/kg/min via INTRAVENOUS
  Administered 2024-01-26 (×2): 30 ug/kg/min via INTRAVENOUS
  Filled 2024-01-21 (×9): qty 100

## 2024-01-21 MED ORDER — SODIUM CHLORIDE 0.9 % IV SOLN
500.0000 mg | INTRAVENOUS | Status: AC
  Administered 2024-01-21 – 2024-01-25 (×5): 500 mg via INTRAVENOUS
  Filled 2024-01-21 (×5): qty 5

## 2024-01-21 MED ORDER — LIDOCAINE HCL (PF) 1 % IJ SOLN
INTRAMUSCULAR | Status: DC | PRN
  Administered 2024-01-21: 2 mL

## 2024-01-21 MED ORDER — LABETALOL HCL 5 MG/ML IV SOLN: 10.0000 mg | INTRAVENOUS | Status: DC | PRN

## 2024-01-21 MED ORDER — SODIUM CHLORIDE 0.9 % WEIGHT BASED INFUSION: 1.0000 mL/kg/h | INTRAVENOUS | Status: DC

## 2024-01-21 MED ORDER — HEPARIN SODIUM (PORCINE) 1000 UNIT/ML IJ SOLN
INTRAMUSCULAR | Status: DC | PRN
  Administered 2024-01-21: 3500 [IU] via INTRAVENOUS

## 2024-01-21 MED ORDER — INSULIN ASPART 100 UNIT/ML IJ SOLN
0.0000 [IU] | INTRAMUSCULAR | Status: DC
  Administered 2024-01-21: 2 [IU] via SUBCUTANEOUS
  Administered 2024-01-21: 5 [IU] via SUBCUTANEOUS
  Administered 2024-01-21 – 2024-01-22 (×5): 3 [IU] via SUBCUTANEOUS
  Administered 2024-01-22: 2 [IU] via SUBCUTANEOUS
  Administered 2024-01-22 – 2024-01-23 (×5): 3 [IU] via SUBCUTANEOUS
  Administered 2024-01-23 – 2024-01-24 (×3): 2 [IU] via SUBCUTANEOUS
  Administered 2024-01-24: 3 [IU] via SUBCUTANEOUS
  Administered 2024-01-24 – 2024-01-25 (×2): 2 [IU] via SUBCUTANEOUS
  Administered 2024-01-25: 3 [IU] via SUBCUTANEOUS
  Administered 2024-01-25: 2 [IU] via SUBCUTANEOUS
  Administered 2024-01-25: 3 [IU] via SUBCUTANEOUS
  Administered 2024-01-25 – 2024-01-26 (×4): 2 [IU] via SUBCUTANEOUS
  Filled 2024-01-21 (×26): qty 1

## 2024-01-21 MED ORDER — HEPARIN SODIUM (PORCINE) 1000 UNIT/ML IJ SOLN
INTRAMUSCULAR | Status: AC
  Filled 2024-01-21: qty 10

## 2024-01-21 MED ORDER — SODIUM CHLORIDE 0.9 % IV SOLN: INTRAVENOUS | Status: DC

## 2024-01-21 MED ORDER — ORAL CARE MOUTH RINSE
15.0000 mL | OROMUCOSAL | Status: DC
  Administered 2024-01-21 – 2024-01-26 (×61): 15 mL via OROMUCOSAL

## 2024-01-21 MED ORDER — PROPOFOL 1000 MG/100ML IV EMUL
INTRAVENOUS | Status: AC
  Administered 2024-01-21: 5 ug/kg/min via INTRAVENOUS
  Filled 2024-01-21: qty 100

## 2024-01-21 MED ORDER — ORAL CARE MOUTH RINSE: 15.0000 mL | OROMUCOSAL | Status: DC | PRN

## 2024-01-21 MED ORDER — HEPARIN (PORCINE) 25000 UT/250ML-% IV SOLN
1050.0000 [IU]/h | INTRAVENOUS | Status: DC
  Administered 2024-01-21: 1050 [IU]/h via INTRAVENOUS
  Filled 2024-01-21: qty 250

## 2024-01-21 MED ORDER — VERAPAMIL HCL 2.5 MG/ML IV SOLN
INTRAVENOUS | Status: AC
Start: 2024-01-21 — End: ?
  Filled 2024-01-21: qty 2

## 2024-01-21 MED ORDER — PIPERACILLIN-TAZOBACTAM 3.375 G IVPB
3.3750 g | Freq: Three times a day (TID) | INTRAVENOUS | Status: AC
Start: 2024-01-21 — End: 2024-01-26
  Administered 2024-01-21 – 2024-01-26 (×15): 3.375 g via INTRAVENOUS
  Filled 2024-01-21 (×15): qty 50

## 2024-01-21 MED ORDER — IOHEXOL 300 MG/ML  SOLN
INTRAMUSCULAR | Status: DC | PRN
  Administered 2024-01-21: 74 mL

## 2024-01-21 MED ORDER — HYDROCORTISONE SOD SUC (PF) 100 MG IJ SOLR
100.0000 mg | INTRAMUSCULAR | Status: AC
  Administered 2024-01-21: 100 mg via INTRAVENOUS

## 2024-01-21 MED ORDER — CALCIUM CHLORIDE 10 % IV SOLN
INTRAVENOUS | Status: AC | PRN
Start: 2024-01-21 — End: 2024-01-21
  Administered 2024-01-21: 1 g via INTRAVENOUS

## 2024-01-21 MED ORDER — EPINEPHRINE 1 MG/10ML IJ SOSY
PREFILLED_SYRINGE | INTRAMUSCULAR | Status: AC | PRN
Start: 2024-01-21 — End: 2024-01-21
  Administered 2024-01-21 (×3): 1 mg via INTRAVENOUS

## 2024-01-21 MED ORDER — VASOPRESSIN 20 UNITS/100 ML INFUSION FOR SHOCK
0.0000 [IU]/min | INTRAVENOUS | Status: DC
  Administered 2024-01-21 (×2): 0.03 [IU]/min via INTRAVENOUS
  Filled 2024-01-21: qty 100

## 2024-01-21 MED ORDER — PANTOPRAZOLE SODIUM 40 MG IV SOLR
40.0000 mg | Freq: Every day | INTRAVENOUS | Status: DC
  Administered 2024-01-21 – 2024-01-25 (×5): 40 mg via INTRAVENOUS
  Filled 2024-01-21 (×5): qty 10

## 2024-01-21 MED ORDER — VERAPAMIL HCL 2.5 MG/ML IV SOLN
INTRAVENOUS | Status: DC | PRN
  Administered 2024-01-21: 2.5 mg via INTRAVENOUS

## 2024-01-21 MED ORDER — IOHEXOL 350 MG/ML SOLN
100.0000 mL | Freq: Once | INTRAVENOUS | Status: AC | PRN
  Administered 2024-01-21: 100 mL via INTRAVENOUS

## 2024-01-21 MED ORDER — SODIUM CHLORIDE 0.9% FLUSH: 10.0000 mL | INTRAVENOUS | Status: DC | PRN

## 2024-01-21 MED ORDER — LORAZEPAM 2 MG/ML IJ SOLN
INTRAMUSCULAR | Status: AC
  Filled 2024-01-21: qty 1

## 2024-01-21 MED ORDER — POLYETHYLENE GLYCOL 3350 17 G PO PACK: 17.0000 g | PACK | Freq: Every day | ORAL | Status: DC | PRN

## 2024-01-21 MED ORDER — VASOPRESSIN 20 UNITS/100 ML INFUSION FOR SHOCK
0.0000 [IU]/min | INTRAVENOUS | Status: DC
  Administered 2024-01-21 – 2024-01-23 (×6): 0.04 [IU]/min via INTRAVENOUS
  Filled 2024-01-21 (×6): qty 100

## 2024-01-21 MED ORDER — LEVETIRACETAM IN NACL 1500 MG/100ML IV SOLN
1500.0000 mg | Freq: Once | INTRAVENOUS | Status: AC
  Administered 2024-01-21: 1500 mg via INTRAVENOUS
  Filled 2024-01-21: qty 100

## 2024-01-21 MED ORDER — ETOMIDATE 2 MG/ML IV SOLN
INTRAVENOUS | Status: DC
Start: 2024-01-21 — End: 2024-01-21
  Filled 2024-01-21: qty 20

## 2024-01-21 SURGICAL SUPPLY — 19 items
CATH 5FR JL3.5 JR4 ANG PIG MP (CATHETERS) IMPLANT
DEVICE RAD TR BAND REGULAR (VASCULAR PRODUCTS) IMPLANT
DRAPE BRACHIAL (DRAPES) IMPLANT
GLIDESHEATH SLEND SS 6F .021 (SHEATH) IMPLANT
GUIDEWIRE INQWIRE 1.5J.035X260 (WIRE) IMPLANT
INQWIRE 1.5J .035X260CM (WIRE) ×1 IMPLANT
KIT ENCORE 26 ADVANTAGE (KITS) IMPLANT
KIT SYRINGE INJ CVI SPIKEX1 (MISCELLANEOUS) IMPLANT
NDL PERC 18GX7CM (NEEDLE) IMPLANT
NEEDLE PERC 18GX7CM (NEEDLE) ×1 IMPLANT
PACK CARDIAC CATH (CUSTOM PROCEDURE TRAY) ×1 IMPLANT
PROTECTION STATION PRESSURIZED (MISCELLANEOUS) ×1 IMPLANT
SET ATX-X65L (MISCELLANEOUS) IMPLANT
SHEATH AVANTI 5FR X 11CM (SHEATH) IMPLANT
SHEATH AVANTI 6FR X 11CM (SHEATH) IMPLANT
STATION PROTECTION PRESSURIZED (MISCELLANEOUS) IMPLANT
TUBING CIL FLEX 10 FLL-RA (TUBING) IMPLANT
WIRE GUIDERIGHT .035X150 (WIRE) IMPLANT
WIRE HITORQ VERSACORE ST 145CM (WIRE) IMPLANT

## 2024-01-21 NOTE — Procedures (Signed)
 Left Arterial Catheter Insertion Procedure Note  CRISANTO NIED  [id number]  11-15-1932  Date:01/21/24  Time:12:11 PM    Provider Performing: Aleene Davidson Tukov-Yual    Procedure: Insertion of Arterial Line (40981) with US guidance (19147)   Indication(s) Blood pressure monitoring and/or need for frequent ABGs  Consent Risks of the procedure as well as the alternatives and risks of each were explained to the patient and/or caregiver.  Consent for the procedure was obtained and is signed in the bedside chart  Anesthesia Local with lidocaine 1% at insertion site   Time Out Verified patient identification, verified procedure, site/side was marked, verified correct patient position, special equipment/implants available, medications/allergies/relevant history reviewed, required imaging and test results available.   Sterile Technique Maximal sterile technique including full sterile barrier drape, hand hygiene, sterile gown, sterile gloves, mask, hair covering, sterile ultrasound probe cover (if used).   Procedure Description Area of catheter insertion was cleaned with chlorhexidine and draped in sterile fashion. With real-time ultrasound guidance an arterial catheter was placed into the left radial artery.  Appropriate arterial tracings confirmed on monitor.     Complications/Tolerance None; patient tolerated the procedure well.   EBL Minimal   Specimen(s) None Procedure performed under direct supervision of Dr.Dgayli.

## 2024-01-21 NOTE — Progress Notes (Signed)
 eLink Physician-Brief Progress Note Patient Name: Carlos Richard DOB: May 19, 1932 MRN: [id number]   Date of Service  01/21/2024  HPI/Events of Note  89/M presenting with acute onset chest pain that woke him up from sleep. He was brought to the ED, where he was noted to be somnolent and hypotensive. After he went to have CT done, he had a tonic clonic seizure, became bradycardic hypotensive, then went into asystole. ACLS performed, with ROSC obtained after 15 minutes. He was intubated during the course of the code.  After this, he was taken to the cath lab. Coronary angiogram did not show acte coronary occlusion, no intervention performed.  He was then brought to the ICU for continued management.    eICU Interventions  S/p cardiac arrest - Downtime ~15 minutes.  - Pt currently unresponsive, triggering vent. Serial neurochecks.  - s/p coronary angiogram following code, no intervention indicated.  - Currently hypotensive - started on vasopressor support. Titrate to target MAP >65.  - Serial EKG, troponin.  - Currently intubated, on ventilator support   - Maintain on TV 4-67ml/kg PBW, target plateau pressures <30   - Titrate FIO2 and PEEP to maintain Spo2 >90%   - Serial ABG, will make further vent adjustments as necessary - Glucose control - Monitor I/Os, daily weights.  - Monitor electrolytes, correct abnormalities as warranted.  - No signs of acute infection.  - Follow cultures, trend lactate. Will maintain low threshold to initiate antibiotics.  - Overall prognosis poor.         Arrionna Serena M DELA CRUZ 01/21/2024, 5:55 AM

## 2024-01-21 NOTE — Progress Notes (Signed)
 Patient ID: JONATHANDAVID MARLETT, male   DOB: 06-11-1932, 89 y.o.   MRN: [id number] [facility]    SUBJECTIVE: Intubated sedated   Vitals:   01/21/24 1345 01/21/24 1350 01/21/24 1400 01/21/24 1403  BP:  95/64 97/63 (!) 88/57  Pulse: 62 (!) 58 62 (!) 59  Resp: 18 (!) 26 (!) 25 19  Temp:      TempSrc:      SpO2: 94% 95% 94% 93%  Weight:      Height:         Intake/Output Summary (Last 24 hours) at 01/21/2024 1443 Last data filed at 01/21/2024 1400 Gross per 24 hour  Intake 3299.52 ml  Output 150 ml  Net 3149.52 ml      PHYSICAL EXAM  General: Well developed, well nourished, in no acute distress HEENT:  Normocephalic and atramatic Neck:  No JVD.  Lungs: Clear bilaterally to auscultation and percussion. Heart: HRRR . Normal S1 and S2 without gallops or murmurs.  Abdomen: Bowel sounds are positive, abdomen soft and non-tender  Msk:  Back normal, normal gait. Normal strength and tone for age. Extremities: No clubbing, cyanosis or edema.   Neuro: Alert and oriented X 3. Psych:  Good affect, responds appropriately   LABS: Basic Metabolic Panel: Recent Labs    01/21/24 0350 01/21/24 1258  NA 139 139  K 3.6 3.6  CL 102 109  CO2 14* 21*  GLUCOSE 296* 195*  BUN 13 16  CREATININE 1.47* 1.76*  CALCIUM 11.2* 8.2*  MG 2.9* 2.6*  PHOS  --  3.6   Liver Function Tests: Recent Labs    01/21/24 0350 01/21/24 1258  AST 105* 146*  ALT 104* 118*  ALKPHOS 57 57  BILITOT 0.4 1.1  PROT 5.4* 5.3*  ALBUMIN 2.9* 2.9*   No results for input(s): "LIPASE", "AMYLASE" in the last 72 hours. CBC: Recent Labs    01/21/24 0254 01/21/24 1258  WBC 14.9* 24.7*  HGB 13.3 13.2  HCT 41.9 39.3  MCV 95.0 92.3  PLT 215 196   Cardiac Enzymes: No results for input(s): "CKTOTAL", "CKMB", "CKMBINDEX", "TROPONINI" in the last 72 hours. BNP: Invalid input(s): "POCBNP" D-Dimer: No results for input(s): "DDIMER" in the last 72 hours. Hemoglobin A1C: Recent Labs    01/21/24 0609   HGBA1C 5.7*   Fasting Lipid Panel: No results for input(s): "CHOL", "HDL", "LDLCALC", "TRIG", "CHOLHDL", "LDLDIRECT" in the last 72 hours. Thyroid Function Tests: Recent Labs    01/21/24 0350  TSH 2.726   Anemia Panel: No results for input(s): "VITAMINB12", "FOLATE", "FERRITIN", "TIBC", "IRON", "RETICCTPCT" in the last 72 hours.  ECHOCARDIOGRAM COMPLETE Result Date: 01/21/2024    ECHOCARDIOGRAM REPORT   Patient Name:   SHERARD SUTCH Date of Exam: 01/21/2024 Medical Rec #:  [id number]        Height:       69.0 in Accession #:    [id number]       Weight:       157.0 lb Date of Birth:  [birth year]        BSA:          1.864 m Patient Age:    89 years         BP:           92/66 mmHg Patient Gender: M                HR:           70 bpm. Exam Location:  [facility]  Procedure: 2D Echo, Cardiac Doppler and Color Doppler (Both Spectral and Color            Flow Doppler were utilized during procedure). STAT ECHO Indications:     Shock R57.9  History:         Patient has prior history of Echocardiogram examinations, most                  recent 03/21/2023.  Sonographer:     Overton Mam RDCS, FASE Referring Phys:  [id number] KHABIB DGAYLI Diagnosing Phys: Alwyn Pea MD  Sonographer Comments: Technically difficult study due to poor echo windows and echo performed with patient supine and on artificial respirator. IMPRESSIONS  1. Left ventricular ejection fraction, by estimation, is 50 to 55%. The left ventricle has low normal function. The left ventricle has no regional wall motion abnormalities. Left ventricular diastolic parameters were normal.  2. Right ventricular systolic function is mildly reduced. The right ventricular size is moderately enlarged. Mildly increased right ventricular wall thickness.  3. The mitral valve is normal in structure. Trivial mitral valve regurgitation.  4. The aortic valve is normal in structure. Aortic valve regurgitation is mild. Aortic valve sclerosis is present, with no evidence  of aortic valve stenosis. Conclusion(s)/Recommendation(s): Poor windows for evaluation of left ventricular function by transthoracic echocardiography. Would recommend an alternative means of evaluation. FINDINGS  Left Ventricle: Left ventricular ejection fraction, by estimation, is 50 to 55%. The left ventricle has low normal function. The left ventricle has no regional wall motion abnormalities. Strain was performed and the global longitudinal strain is indeterminate. Global longitudinal strain performed but not reported based on interpreter judgement due to suboptimal tracking. The left ventricular internal cavity size was normal in size. There is no left ventricular hypertrophy. Left ventricular diastolic parameters were normal. Right Ventricle: The right ventricular size is moderately enlarged. Mildly increased right ventricular wall thickness. Right ventricular systolic function is mildly reduced. Left Atrium: Left atrial size was normal in size. Right Atrium: Right atrial size was normal in size. Pericardium: There is no evidence of pericardial effusion. Mitral Valve: The mitral valve is normal in structure. Trivial mitral valve regurgitation. Tricuspid Valve: The tricuspid valve is normal in structure. Tricuspid valve regurgitation is mild. Aortic Valve: The aortic valve is normal in structure. Aortic valve regurgitation is mild. Aortic valve sclerosis is present, with no evidence of aortic valve stenosis. Aortic valve peak gradient measures 5.2 mmHg. Pulmonic Valve: The pulmonic valve was normal in structure. Pulmonic valve regurgitation is not visualized. Aorta: The ascending aorta was not well visualized. IAS/Shunts: No atrial level shunt detected by color flow Doppler. Additional Comments: 3D was performed not requiring image post processing on an independent workstation and was indeterminate.  LEFT VENTRICLE PLAX 2D LVIDd:         3.70 cm     Diastology LVIDs:         2.70 cm     LV e' medial:    5.11  cm/s LV PW:         1.10 cm     LV E/e' medial:  10.2 LV IVS:        1.10 cm     LV e' lateral:   5.66 cm/s LVOT diam:     2.10 cm     LV E/e' lateral: 9.2 LV SV:         56 LV SV Index:   30 LVOT Area:     3.46 cm  LV  Volumes (MOD) LV vol d, MOD A4C: 31.1 ml LV vol s, MOD A4C: 13.1 ml LV SV MOD A4C:     31.1 ml RIGHT VENTRICLE RV Basal diam:  4.30 cm RV S prime:     9.90 cm/s TAPSE (M-mode): 1.5 cm LEFT ATRIUM           Index        RIGHT ATRIUM           Index LA diam:      2.50 cm 1.34 cm/m   RA Area:     17.90 cm LA Vol (A2C): 6.4 ml  3.46 ml/m   RA Volume:   47.60 ml  25.54 ml/m LA Vol (A4C): 21.0 ml 11.27 ml/m  AORTIC VALVE                 PULMONIC VALVE AV Area (Vmax): 3.01 cm     PV Vmax:        0.77 m/s AV Vmax:        114.00 cm/s  PV Peak grad:   2.4 mmHg AV Peak Grad:   5.2 mmHg     RVOT Peak grad: 1 mmHg LVOT Vmax:      99.00 cm/s LVOT Vmean:     68.200 cm/s LVOT VTI:       0.162 m  AORTA Ao Root diam: 3.60 cm Ao Asc diam:  3.50 cm MITRAL VALVE               TRICUSPID VALVE MV Area (PHT): 3.27 cm    TR Peak grad:   30.0 mmHg MV Decel Time: 232 msec    TR Vmax:        274.00 cm/s MV E velocity: 51.90 cm/s MV A velocity: 53.50 cm/s  SHUNTS MV E/A ratio:  0.97        Systemic VTI:  0.16 m                            Systemic Diam: 2.10 cm Alwyn Pea MD Electronically signed by Alwyn Pea MD Signature Date/Time: 01/21/2024/12:59:54 PM    Final    DG Abd 1 View Result Date: 01/21/2024 CLINICAL DATA:  [id number] Encounter for orogastric (OG) tube placement [id number] EXAM: ABDOMEN - 1 VIEW COMPARISON:  Abdominal radiograph from earlier today FINDINGS: Enteric tube tip is seen in the lower thoracic esophagus. External pacer pads overlie the left lower chest. Top-normal caliber small bowel loops in the visualized abdomen with no evidence of pneumatosis or pneumoperitoneum. Excreted contrast noted in the nondilated renal collecting systems. Similar mild patchy bibasilar lung opacities. IMPRESSION:  Enteric tube tip in the lower thoracic esophagus, recommend replacement or advancement. Per the technologist notes, the provider was present for the imaging and removed the OG tube after the radiograph. Electronically Signed   By: Delbert Phenix M.D.   On: 01/21/2024 10:25   DG Abd 1 View Result Date: 01/21/2024 CLINICAL DATA:  89 year old male central line placement. EXAM: ABDOMEN - 1 VIEW COMPARISON:  Portable chest 0639 hours reported separately. FINDINGS: AP view of the lower chest and upper abdomen at 0639 hours. Unchanged appearance of right IJ central line as described separately. As on the portable chest no enteric tube is identified. Pacer/resuscitation pads project over the medial left diaphragm. Coarse asymmetric right lung opacity again noted. Stable cardiac size and mediastinal contours. Mild gaseous distension of the stomach. Persistent bilateral nephrograms also with some  contrast excretion into nondilated collecting systems. IMPRESSION: 1. No enteric tube identified. Mild gaseous distension of the stomach. Consider enteric tube placement. 2. Right IJ central line placement as described on portable chest separately. 3. Persistent bilateral nephrograms suggesting acute or chronic renal insufficiency. 4. Abnormal right lung opacity as described separately. Electronically Signed   By: Odessa Fleming M.D.   On: 01/21/2024 07:00   DG Chest Port 1 View Result Date: 01/21/2024 CLINICAL DATA:  89 year old male central line placement. EXAM: PORTABLE CHEST 1 VIEW COMPARISON:  CTA chest abdomen and pelvis 0324 hours today. FINDINGS: Portable AP upright view at 0639 hours. Intubated now. Endotracheal tube tip in good position just below the clavicles. No enteric tube identified. New right IJ approach vascular catheter, tip in good position near the expected level of the cavoatrial junction. Stable cardiac size and mediastinal contours. Left chest generator device with electrical leads extending up into the chest again  noted. Coarse, confluent, asymmetric pulmonary opacity superimposed on emphysema with left lung base hyperinflation and/or bullous changes as seen by CT. No pneumothorax or pleural effusion identified. IMPRESSION: 1. Satisfactory endotracheal tube and right IJ approach vascular catheter placement. No pneumothorax. 2. Emphysema with extensive right lung opacity as characterized by CT this morning. Electronically Signed   By: Odessa Fleming M.D.   On: 01/21/2024 06:58   CT Angio Chest/Abd/Pel for Dissection W and/or Wo Contrast Result Date: 01/21/2024 CLINICAL DATA:  Acute aortic syndrome EXAM: CT ANGIOGRAPHY CHEST, ABDOMEN AND PELVIS TECHNIQUE: Non-contrast CT of the chest was initially obtained. Multidetector CT imaging through the chest, abdomen and pelvis was performed using the standard protocol during bolus administration of intravenous contrast. Multiplanar reconstructed images and MIPs were obtained and reviewed to evaluate the vascular anatomy. RADIATION DOSE REDUCTION: This exam was performed according to the departmental dose-optimization program which includes automated exposure control, adjustment of the mA and/or kV according to patient size and/or use of iterative reconstruction technique. CONTRAST:  OMNIPAQUE IOHEXOL 350 MG/ML SOLN COMPARISON:  None Available. FINDINGS: CTA CHEST FINDINGS Cardiovascular: No significant coronary artery calcification. Global cardiac size within normal limits. No pericardial effusion. The central pulmonary arteries are mildly enlarged suggesting changes of pulmonary arterial hypertension. No intraluminal filling defect identified through the segmental level to suggest acute pulmonary embolism. The thoracic aorta is of normal caliber. No intramural hematoma, dissection, or aneurysm. Mild atherosclerotic calcification. Arch vasculature demonstrates normal anatomic configuration and is widely patent proximally. Mediastinum/Nodes: No enlarged mediastinal, hilar, or axillary  lymph nodes. Thyroid gland, trachea, and esophagus demonstrate no significant findings. Small hiatal hernia. Lungs/Pleura: Severe emphysema. Superimposed interstitial and airspace infiltrate throughout the left upper lobe and right lung, likely infectious in the acute setting. Hyper lucency of the left lower lobe may relate to compensatory hyperinflation, asymmetric emphysema or the sequela of remote viral infection. There is no pneumothorax or pleural effusion. Marked narrowing of the mainstem bronchi, left lower lobar bronchus and bronchus intermedius in keeping with changes bronchomalacia. Musculoskeletal: No acute bone abnormality. Remote healed left humeral head fracture. Review of the MIP images confirms the above findings. CTA ABDOMEN AND PELVIS FINDINGS VASCULAR Aorta: Normal caliber aorta without aneurysm, dissection, vasculitis or significant stenosis. Moderate atherosclerotic calcification. Celiac: Patent without evidence of aneurysm, dissection, vasculitis or significant stenosis. SMA: Patent without evidence of aneurysm, dissection, vasculitis or significant stenosis. Renals: Both renal arteries are patent without evidence of aneurysm, dissection, vasculitis, fibromuscular dysplasia or significant stenosis. IMA: Patent without evidence of aneurysm, dissection, vasculitis or significant stenosis. Inflow:  Patent without evidence of aneurysm, dissection, vasculitis or significant stenosis. Veins: The renal veins and suprarenal inferior vena cava appear relatively slit-like, finding that can be seen with intravascular depletion. The venous structures are otherwise unremarkable on this arterial phase examination. Review of the MIP images confirms the above findings. NON-VASCULAR Hepatobiliary: Cholelithiasis without superimposed pericholecystic inflammatory change. Liver unremarkable; no enhancing intrahepatic mass identified. No intra or extrahepatic biliary ductal dilation. Pancreas: Unremarkable Spleen:  Unremarkable Adrenals/Urinary Tract: The adrenal glands are unremarkable. The kidneys are unremarkable save for simple cortical cysts bilaterally for which no follow-up imaging is recommended. The bladder appears mildly distend and trabeculated in keeping with changes bladder outlet obstruction. Stomach/Bowel: Stomach is within normal limits. Appendix appears normal. No evidence of bowel wall thickening, distention, or inflammatory changes. Lymphatic: No pathologic at abdomen and pelvis. Reproductive: Moderate prostatic hypertrophy. The prostate gland is partially obscured by streak artifact. Other: Small fat containing hernias. Musculoskeletal: There is a acute to subacute appearing right subcapital femoral neck fracture with impaction and external rotation of the distal fracture fragment. The femoral head is still seated right acetabulum with superimposed moderate right hip degenerative arthritis. Left hip bipolar hemiarthroplasty has been performed. Degenerative changes seen within the lumbar spin. Review of the MIP images confirms the above findings. IMPRESSION: 1. No evidence of aortic dissection or aneurysm. No evidence of acute pulmonary embolism. 2. Acute to subacute appearing right subcapital femoral neck fracture with impaction and external rotation of the distal fracture fragment. 3. Severe emphysema. 4. Extensive airspace infiltrate most in keeping with acute infection the appropriate clinical setting. 5. Marked narrowing of the mainstem bronchi, left lower lobar bronchus and bronchus intermedius in keeping with changes bronchomalacia. 6. The renal veins and suprarenal inferior vena cava appear relatively slit-like, finding that can be seen with intravascular depletion. 7. Cholelithiasis. 8. Moderate prostatic hypertrophy with associated bladder outlet obstruction. These results were called by telephone at the time of interpretation on 01/21/2024 at 4:07 am to provider [facility] , who verbally  acknowledged these results. Aortic Atherosclerosis (ICD10-I70.0) and Emphysema (ICD10-J43.9). Electronically Signed   By: Helyn Numbers M.D.   On: 01/21/2024 04:07   CT Head Wo Contrast Result Date: 01/21/2024 CLINICAL DATA:  Mental status change, unknown cause EXAM: CT HEAD WITHOUT CONTRAST TECHNIQUE: Contiguous axial images were obtained from the base of the skull through the vertex without intravenous contrast. RADIATION DOSE REDUCTION: This exam was performed according to the departmental dose-optimization program which includes automated exposure control, adjustment of the mA and/or kV according to patient size and/or use of iterative reconstruction technique. COMPARISON:  CT head 10/19/2023. FINDINGS: Brain: No evidence of acute infarction, hemorrhage, hydrocephalus, extra-axial collection or mass lesion/mass effect. Similar patchy white matter hypodensities, compatible with chronic microvascular ischemic disease. Similar position of a left deep brain stimulator lead without complicating feature. Similar cerebral atrophy. Vascular: No hyperdense vessel. Skull: No acute fracture. Sinuses/Orbits: No acute finding. Other: No mastoid effusions. IMPRESSION: 1. No evidence of acute intracranial abnormality. 2. Similar chronic microvascular ischemic disease and left deep brain stimulator. Electronically Signed   By: Feliberto Harts M.D.   On: 01/21/2024 03:43     Echo preserved overall left ventricular function EF of at least 50 to 55%  TELEMETRY: Normal sinus rhythm nonspecific ST-T changes:  ASSESSMENT AND PLAN:  Principal Problem:   STEMI (ST elevation myocardial infarction) [facility]) Active Problems:   Cardiac arrest with successful resuscitation [facility]) Respiratory failure Seizure activity Atrial fibrillation Chronic COPD Tremors  Plan Respiratory  failure continue supportive care with the vent and inhalers agree with critical care management Atrial fibrillation reasonably manage now in sinus  rhythm rate of 80 Anticoagulation heparin can transition back to Xarelto once stable Possible infection and sepsis continue broad-spectrum antibiotic therapy Possible pneumonia continue broad-spectrum antibiotic therapy Recommend conservative cardiac input at this stage   Alwyn Pea, MD 01/21/2024 2:43 PM

## 2024-01-21 NOTE — IPAL (Signed)
  Interdisciplinary Goals of Care Family Meeting   Date carried out: 01/21/2024  Location of the meeting: Conference room  Member's involved: Physician and Family Member or next of kin  Durable Power of Attorney or Environmental health practitioner: Patient's wife, two sons, and daughter in Social worker.    Discussion: We discussed goals of care for Carlos Richard .  Explained that [name] has suffered a cardiac arrest overnight, discussed that his condition is quite critical with shock and signs of multi-organ failure. We explored next steps in management, with emphasis on neurological recovery. Explained that he is withdrawing to pain which is a good sign neurologically, but also expressed my concern that with his age and underlying frailty, his chances of making a meaningful recovery are slim. Will observe the trend of his shock and multi-organ failure and continue to have goals of care discussions with family.  Code status:   Code Status: Full Code   Disposition: Continue current acute care  Time spent for the meeting: 25 minutes    Raechel Chute, MD  01/21/2024, 3:11 PM

## 2024-01-21 NOTE — Code Documentation (Signed)
 ROSC obtained.

## 2024-01-21 NOTE — ED Notes (Signed)
 Code Stemi called in field by EMS

## 2024-01-21 NOTE — CV Procedure (Signed)
 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

## 2024-01-21 NOTE — ED Triage Notes (Addendum)
 Pt to Ed via [facility] from home. Pt woke up from sleep screaming. Wife did cpr. Upon arrival EMS stated pt was agonal breathing with BP in the 70s. EMS gave bolus of NS, Placed pt on a levo drip and non-rebreather O2 mask. Upon arrival to ED pt was alert.

## 2024-01-21 NOTE — ED Provider Notes (Addendum)
 [facility] Provider Note    Event Date/Time   First MD Initiated Contact with Patient 01/21/24 0309     (approximate)   History   Loss of Consciousness   HPI  Carlos Richard is a 89 y.o. male   Past medical history of atrial fibrillation on Xarelto, COPD, hypertension, deep brain stimulator for tremors who presents as a code STEMI from the field.  My initial interaction with this patient was a call from EMS with a concerning EKG with some ST elevations in V1 through V3, with a report that he was sleeping in bed with his wife when he shouted out and the wife found him with agonal respirations and started CPR.  It was unclear whether he had pulses or not.  When EMS arrived he was agonal respirations and hypotensive and altered not responding properly.  They report that they started Levophed after which his blood pressure improved and he began to speak, and look better, though continued to be somnolent appearing.  He is able to state his name and when I ask him if he has any discomfort he states that he has back pain.   His wife arrived shortly thereafter and states that he is otherwise been in his regular state of health going to bed last night and corroborates information given by EMS above.  She notes that he has a history of syncopal episodes but denies any recent trauma or accounts of pain or discomfort by the patient prior to going bed.  Independent Historian contributed to assessment above: EMS as above  External Medical Documents Reviewed: Neurology note recently documenting seizure-like activity with a plan for EEG in the future if they continue     Physical Exam   Triage Vital Signs: ED Triage Vitals [01/21/24 0247]  Encounter Vitals Group     BP (!) 105/57     Systolic BP Percentile      Diastolic BP Percentile      Pulse Rate 71     Resp (!) 24     Temp      Temp src      SpO2 100 %     Weight      Height      Head Circumference       Peak Flow      Pain Score      Pain Loc      Pain Education      Exclude from Growth Chart     Most recent vital signs: Vitals:   01/21/24 0348 01/21/24 0350  BP: 132/72 138/83  Pulse: (!) 128   Resp:  (!) 24  SpO2:      General: Frail chronically ill-appearing, answering questions in a whisper and is able to tell me his name and follow simple commands.  He is maintaining his airway and breathing on his own.  Has coarse lung sounds.  He has no significant peripheral edema.  He has a soft nondistended nontender abdomen without rigidity or guarding.  His radial pulses are equal and intact bilaterally.  Bedside ultrasound shows no obvious wall motion abnormalities, large pericardial effusions, and a scan through his ascending aorta and intra-abdominal aorta shows no obvious dissection flaps.   ED Results / Procedures / Treatments   Labs (all labs ordered are listed, but only abnormal results are displayed) Labs Reviewed  CBC - Abnormal; Notable for the following components:      Result Value   WBC 14.9 (*)  All other components within normal limits  CBG MONITORING, ED - Abnormal; Notable for the following components:   Glucose-Capillary 166 (*)    All other components within normal limits  BASIC METABOLIC PANEL  TROPONIN I (HIGH SENSITIVITY)     I ordered and reviewed the above labs they are notable for glucose was 166  EKG  ED ECG REPORT I, Pilar Jarvis, the attending physician, personally viewed and interpreted this ECG.   Date: 01/21/2024  EKG Time: 0249  Rate: 53  Rhythm: unclear -computer reads as AV block third-degree though would be curious given his narrow complex QRS, though I see no discernible P waves to distinguish between sinus or A-V dissociation  Axis: Normal  ST&T Change: No STEMI    RADIOLOGY I independently reviewed and interpreted CT of the head and see no obvious bleeding or midline shift I also reviewed radiologist's formal read.    PROCEDURES:  Critical Care performed: Yes, see critical care procedure note(s)  .Critical Care  Performed by: Pilar Jarvis, MD Authorized by: Pilar Jarvis, MD   Critical care provider statement:    Critical care time (minutes):  90   Critical care was time spent personally by me on the following activities:  Development of treatment plan with patient or surrogate, discussions with consultants, evaluation of patient's response to treatment, examination of patient, ordering and review of laboratory studies, ordering and review of radiographic studies, ordering and performing treatments and interventions, pulse oximetry, re-evaluation of patient's condition and review of old charts Procedure Name: Intubation Date/Time: 01/21/2024 4:09 AM  Performed by: Pilar Jarvis, MDPre-anesthesia Checklist: Patient identified, Patient being monitored, Emergency Drugs available, Timeout performed and Suction available Oxygen Delivery Method: Non-rebreather mask Preoxygenation: Pre-oxygenation with 100% oxygen Induction Type: Rapid sequence Ventilation: Mask ventilation without difficulty Laryngoscope Size: Glidescope and 3 Tube size: 7.5 mm Number of attempts: 1 Placement Confirmation: ETT inserted through vocal cords under direct vision, CO2 detector and Breath sounds checked- equal and bilateral Secured at: 25 cm       MEDICATIONS ORDERED IN ED: Medications  norepinephrine (LEVOPHED) 4mg  in (0.016 mg/mL) premix infusion (5 mcg/min Intravenous New Bag/Given 01/21/24 0251)  LORazepam (ATIVAN) 2 MG/ML injection (  Not Given 01/21/24 0354)  rocuronium (ZEMURON) 100 MG/10ML injection (  Not Given 01/21/24 0354)  etomidate (AMIDATE) 2 MG/ML injection (  Not Given 01/21/24 0354)  propofol (DIPRIVAN) 1000 MG/100ML infusion (5 mcg/kg/min Intravenous New Bag/Given 01/21/24 0350)  aspirin suppository 300 mg (300 mg Rectal Given 01/21/24 0315)  iohexol (OMNIPAQUE) 350 MG/ML injection 100 mL (100 mLs Intravenous  Contrast Given 01/21/24 0328)  atropine injection 0.4 mg (0.4 mg Intravenous Given 01/21/24 0345)  EPINEPHrine (ADRENALIN) 1 MG/10ML injection (1 mg Intravenous Given 01/21/24 0342)  sodium bicarbonate injection (100 mEq Intravenous Given 01/21/24 0342)  calcium chloride injection (1 g Intravenous Given 01/21/24 0343)    External physician / consultants:  I spoke with Dr. Juliann Pares of cardiology STEMI team regarding care plan for this patient.   IMPRESSION / MDM / ASSESSMENT AND PLAN / ED COURSE  I reviewed the triage vital signs and the nursing notes.                                Patient's presentation is most consistent with acute presentation with potential threat to life or bodily function.  Differential diagnosis includes, but is not limited to, ACS, dissection, electrolyte derangements, ICH, seizure  The patient is on the cardiac monitor to evaluate for evidence of arrhythmia and/or significant heart rate changes.  MDM:     After review of initial clinical evaluation by EMS report and transmitted EKG, Dr. Juliann Pares and I asked EMS to activate code stemi from the field.   Upon arrival patient awake somnolent but answer questions and complains only of back pain, nonspecific.  He got rectal aspirin.  EMS had started Levophed given his hypotension to the 70s systolic in the field.  Had blood pressures from the 100-120 range and we kept the Levophed at a low dose with close observation to keep it from getting too high as the dissection was on differential diagnosis as well  I spoke with Dr. Juliann Pares and together we agree that Cath Lab activation at this time though appropriate should be delayed to rule out dissection as that is high on the differential diagnosis.  The patient was promptly brought to the CT scanner and a CT of the head was obtained (given questionable seizure activity with altered mental status and tongue bite and potentially resolving postictal period) as well as a CT  angiogram of the chest abdomen pelvis.  Immediately after the scan was performed the patient had a witnessed 32nd tonic-clonic seizure activity.  He was maintaining his airway afterwards and all seizure activity resolved without intervention and so he was promptly brought back to the emergency department room.  He decompensated then.  His blood pressure dropped, he became bradycardic and we gave atropine and his heart rate responded going up to the 90s but he continued to get more hypotensive and then asystolic and so ACLS was performed for approximately 15 minutes.  In addition to standard ACLS we gave bicarb and calcium.  I intubated him.  We got ROSC after about 15 minutes.  We put him on propofol.  He remained on Levophed.  I had a phone call with radiology and there was no dissection. CT head read no bleed.  He was immediately taken to the catheterization lab by Dr. Juliann Pares.  --   Radiology called to further detail the results of CT angiogram and they do show a right sided hip fracture as well as evidence of pneumonia and a distended bladder.       FINAL CLINICAL IMPRESSION(S) / ED DIAGNOSES   Final diagnoses:  ST elevation myocardial infarction (STEMI), unspecified artery (HCC)  Seizure (HCC)  Closed fracture of right hip, initial encounter [facility])  Cardiac arrest [facility])  Urinary retention     Rx / DC Orders   ED Discharge Orders     None        Note:  This document was prepared using Dragon voice recognition software and may include unintentional dictation errors.    Pilar Jarvis, MD 01/21/24 1610    Pilar Jarvis, MD 01/21/24 [phone]

## 2024-01-21 NOTE — Progress Notes (Signed)
  Echocardiogram 2D Echocardiogram has been performed.  Carlos Richard 01/21/2024, 12:48 PM

## 2024-01-21 NOTE — ED Notes (Addendum)
 ET tube palced by MD Modesto Charon and RT, 7.5 and 25 mm at the lip.

## 2024-01-21 NOTE — Plan of Care (Signed)
  Problem: Education: Goal: Knowledge of General Education information will improve Description: Including pain rating scale, medication(s)/side effects and non-pharmacologic comfort measures Outcome: Progressing   Problem: Clinical Measurements: Goal: Ability to maintain clinical measurements within normal limits will improve Outcome: Progressing Goal: Will remain free from infection Outcome: Progressing Goal: Diagnostic test results will improve Outcome: Progressing Goal: Respiratory complications will improve Outcome: Progressing Goal: Cardiovascular complication will be avoided Outcome: Progressing   Problem: Activity: Goal: Risk for activity intolerance will decrease Outcome: Progressing   Problem: Coping: Goal: Level of anxiety will decrease Outcome: Progressing   Problem: Elimination: Goal: Will not experience complications related to bowel motility Outcome: Progressing Goal: Will not experience complications related to urinary retention Outcome: Progressing   Problem: Pain Managment: Goal: General experience of comfort will improve and/or be controlled Outcome: Progressing   Problem: Safety: Goal: Ability to remain free from injury will improve Outcome: Progressing   Problem: Skin Integrity: Goal: Risk for impaired skin integrity will decrease Outcome: Progressing   Problem: Fluid Volume: Goal: Ability to maintain a balanced intake and output will improve Outcome: Progressing   Problem: Metabolic: Goal: Ability to maintain appropriate glucose levels will improve Outcome: Progressing   Problem: Skin Integrity: Goal: Risk for impaired skin integrity will decrease Outcome: Progressing   Problem: Tissue Perfusion: Goal: Adequacy of tissue perfusion will improve Outcome: Progressing   Problem: Cardiovascular: Goal: Ability to achieve and maintain adequate cardiovascular perfusion will improve Outcome: Progressing Goal: Vascular access site(s) Level 0-1  will be maintained Outcome: Progressing

## 2024-01-21 NOTE — Progress Notes (Signed)
 Dr. Aundria Rud aware that patient's blood pressure cuff reads a higher diastolic and MAP than the arterial line pressure. MD gave order to titrate vasopressors based off MAP of non-invasive cuff pressure.

## 2024-01-21 NOTE — Consult Note (Signed)
 PHARMACY - ANTICOAGULATION CONSULT NOTE  Pharmacy Consult for Heparin Indication: atrial fibrillation  No Known Allergies  Patient Measurements: Height: 5' 9.02" (175.3 cm) Weight: 71.2 kg (156 lb 15.5 oz) IBW/kg (Calculated) : 70.74 Heparin Dosing Weight: 71.2 kg   Vital Signs: Temp: 97 F (36.1 C) (03/08 1100) Temp Source: Axillary (03/08 1100) BP: 94/62 (03/08 1445) Pulse Rate: 60 (03/08 1445)  Labs: Recent Labs    01/21/24 0254 01/21/24 0350 01/21/24 0609 01/21/24 1258  HGB 13.3  --   --  13.2  HCT 41.9  --   --  39.3  PLT 215  --   --  196  APTT  --   --  124*  --   LABPROT  --   --  23.0*  --   INR  --   --  2.0*  --   CREATININE  --  1.47*  --  1.76*  TROPONINIHS 16  --  482* 1,118*   Estimated Creatinine Clearance: 28.5 mL/min (A) (by C-G formula based on SCr of 1.76 mg/dL (H)).  Medical History: Past Medical History:  Diagnosis Date   Atrial fibrillation (HCC)    COPD (chronic obstructive pulmonary disease) (HCC)    Hypertension    Near syncope    Tremors of nervous system    Medications:  PTA Xarelto - last dose taken yesterday afternoon per patient's wife   Assessment: Carlos Richard is an 89 year old male that presented today as a code STEMI. Elevated troponins of 482 > 1118. From chart review, patient takes Xarelto 15 mg daily at home. Confirmed with patient's wife that last dose was taken yesterday afternoon. Pharmacy has been consulted for initiation and management of a heparin infusion. Baseline labs: Hgb 13.2, PLT 196, aPTT 124, INR 2.0, HL has been ordered.   Goal of Therapy:  Heparin level 0.3-0.7 units/ml Monitor platelets by anticoagulation protocol: Yes   Plan:  Last dose of Xarelto taken yesterday afternoon, next dose would be due now so will avoid initial bolus and start heparin infusion at 1050 units/hr  Check aPTT level 8 hours after initiaiton of infusion  Will monitor aPTT levels until correlation with HL  Monitor HL and CBC  daily   Littie Deeds, PharmD Pharmacy Resident  01/21/2024 3:23 PM

## 2024-01-21 NOTE — ED Notes (Signed)
 Cardiologist at bedside.

## 2024-01-21 NOTE — Consult Note (Signed)
 PHARMACY CONSULT NOTE - ELECTROLYTES  Pharmacy Consult for Electrolyte Monitoring and Replacement   Recent Labs: Height: 5' 9.02" (175.3 cm) Weight: 71.2 kg (156 lb 15.5 oz) IBW/kg (Calculated) : 70.74 Estimated Creatinine Clearance: 34.1 mL/min (A) (by C-G formula based on SCr of 1.47 mg/dL (H)). Potassium (mmol/L)  Date Value  01/21/2024 3.6  11/13/2013 3.6   Magnesium (mg/dL)  Date Value  14/78/2956 2.9 (H)   Calcium (mg/dL)  Date Value  21/30/8657 11.2 (H)   Calcium, Total (mg/dL)  Date Value  84/69/6295 8.8   Albumin (g/dL)  Date Value  28/41/3244 2.9 (L)  09/20/2023 4.2  11/13/2013 3.8   Sodium (mmol/L)  Date Value  01/21/2024 139  10/31/2023 145 (H)  11/13/2013 141    Assessment  Carlos Richard is a 89 y.o. male presenting with chest pain. PMH significant for atrial fibrillation with slow ventricular response, B12 deficiency, HTN, HLD, COPD, essential tremors (ET), left thalamic DBS stimulator. Pharmacy has been consulted to monitor and replace electrolytes.  Diet: heart-healthy MIVF: NS @ 100 mL/hr  Goal of Therapy: Electrolytes WNL  Plan:  No replacement indicated today Check BMP, Mg, Phos with AM labs  Thank you for allowing pharmacy to be a part of this patient's care.  Bettey Costa, PharmD Clinical Pharmacist 01/21/2024 8:02 AM

## 2024-01-21 NOTE — H&P (Signed)
 NAME:  Carlos Richard, MRN:  [id number], DOB:  15-Feb-1932, LOS: 0 ADMISSION DATE:  01/21/2024, CONSULTATION DATE: 01/21/2023 REFERRING MD: Juliann Pares.  CHIEF COMPLAINT: Chest pain  HPI  89 y.o male with significant PMH of atrial fibrillation with slow ventricular response, B12 deficiency, HTN, HLD, COPD, essential tremors (ET), left thalamic DBS stimulator for ET who presented to the ED as a code STEMI.  Per ED reports, patient was sleeping when he woke up screaming.  Patient's wife found him with agonal respiration and started CPR although unclear whether he had a pulse or not.  EMS was called and on their arrival patient was found to be hypotensive, altered with agonal respirations.  Levophed was started with slight improvement in his blood pressure.  Initial EKG concerning for ST elevation in V1 through V3 therefore code STEMI called en route to the ED.   ED Course: Initial vital signs showed HR of beats/minute, BP mm Hg, the RR 30 breaths/minute, and the oxygen saturation % on and a temperature of 98.11F (36.9C).  He was awake somnolent but able to answer questions with only complaint of back pain.  Patient was discussed with STEMI on call who recommended activating Cath Lab.  Due to concern for possible dissection, patient was taken to CT for further evaluation.  Upon return from CT patient had a witnessed generalized tonic-clonic seizure activity.  He was brought back to the ED immediately where he decompensated.  Patient dropped his blood pressure, became bradycardic and then went into asystole.  CPR/ACLS initiated for approximately 15 minutes prior to ROSC achieved.  Patient intubated in the ED.  He was started on Levophed and propofol and taken emergently to Cath Lab.  Pertinent Labs/Diagnostics Findings: Na+/ K+: 139/3.6  Glucose: 296 BUN/Cr.:13/1.47, CO2 14, anion gap 23 WBC: K/L without bands or neutrophil predominance   troponin:   CXR> CTH> CTA Chest> CT Abd/pelvis> Medication  administered in the ED: Disposition: Patient was transferred to the ICU post cardiac cath intubated and sedated.  PCCM consulted  Significant Hospital Events   3/8: Admit to ICU s/p PEA cardiac arrest.  Initial concern for STEMI however post cath no occlusion of the coronary vessels noted therefore no intervention was indicated.  Consults:  Cardiology  Procedures:  01/21/24: Intubation 01/21/24:Right internal jugular Central Line 01/21/24:Cardiac Cath  Significant Diagnostic Tests:  01/21/24: Noncontrast CT head> IMPRESSION: 1. No evidence of acute intracranial abnormality. 2. Similar chronic microvascular ischemic disease and left deep brain stimulator.  01/21/24: CTA Chest, abdomen and pelvis> IMPRESSION: 1. No evidence of aortic dissection or aneurysm. No evidence of acute pulmonary embolism. 2. Acute to subacute appearing right subcapital femoral neck fracture with impaction and external rotation of the distal fracture fragment. 3. Severe emphysema. 4. Extensive airspace infiltrate most in keeping with acute infection the appropriate clinical setting. 5. Marked narrowing of the mainstem bronchi, left lower lobar bronchus and bronchus intermedius in keeping with changes bronchomalacia. 6. The renal veins and suprarenal inferior vena cava appear relatively slit-like, finding that can be seen with intravascular depletion. 7. Cholelithiasis. 8. Moderate prostatic hypertrophy with associated bladder outlet obstruction.   Interim History / Subjective:      Micro Data:  3/8: SARS-CoV-2 PCR> negative 3/8: Influenza PCR> negative 3/8: Blood culture x2> 3/8: MRSA PCR>>  3/8: Strep pneumo urinary antigen> 3/8: Legionella urinary antigen>  Antimicrobials:  Azithromycin 3/8> Ceftriaxone 3/8>  OBJECTIVE  Blood pressure 138/83, pulse (!) 128, resp. rate (!) 24, SpO2 (!) 47%.  No intake or output data in the 24 hours ending 01/21/24 0429 There were no vitals filed for  this visit.   Physical Examination  GENERAL: 89 year-old critically ill patient lying in the bed intubated and sedated EYES: PEERLA. No scleral icterus. Extraocular muscles intact.  HEENT: Head atraumatic, normocephalic. Oropharynx and nasopharynx clear.  NECK:  No JVD, supple  LUNGS: Decreased breath sounds bilaterally R>L. Mild use of accessory muscles of respiration.  CARDIOVASCULAR: S1, S2 normal. No murmurs, rubs, or gallops.  ABDOMEN: Soft, NTND EXTREMITIES: No swelling or erythema.  Capillary refill < 3 seconds in all extremities. Pulses palpable distally. NEUROLOGIC: The patient is intubated and sedated . No focal neurological deficit appreciated. Cranial nerves are intact.  SKIN: No obvious rash, lesion, or ulcer. Warm to touch Labs/imaging that I havepersonally reviewed  (right click and "Reselect all SmartList Selections" daily)     Labs   CBC: Recent Labs  Lab 01/21/24 0254  WBC 14.9*  HGB 13.3  HCT 41.9  MCV 95.0  PLT 215    Basic Metabolic Panel: Recent Labs  Lab 01/21/24 0350  NA 139  K 3.6  CL 102  CO2 14*  GLUCOSE 296*  BUN 13  CREATININE 1.47*  CALCIUM 11.2*   GFR: CrCl cannot be calculated (Unknown ideal weight.). Recent Labs  Lab 01/21/24 0254  WBC 14.9*    Liver Function Tests: No results for input(s): "AST", "ALT", "ALKPHOS", "BILITOT", "PROT", "ALBUMIN" in the last 168 hours. No results for input(s): "LIPASE", "AMYLASE" in the last 168 hours. No results for input(s): "AMMONIA" in the last 168 hours.  ABG    Component Value Date/Time   HCO3 23.5 05/10/2023 0246   ACIDBASEDEF 1.1 05/10/2023 0246   O2SAT 72.4 05/10/2023 0246     Coagulation Profile: No results for input(s): "INR", "PROTIME" in the last 168 hours.  Cardiac Enzymes: No results for input(s): "CKTOTAL", "CKMB", "CKMBINDEX", "TROPONINI" in the last 168 hours.  HbA1C: HB A1C (BAYER DCA - WAIVED)  Date/Time Value Ref Range Status  12/07/2019 02:21 PM 5.7 <7.0 %  Final    Comment:                                          Diabetic Adult            <7.0                                       Healthy Adult        4.3 - 5.7                                                           (DCCT/NGSP) American Diabetes Association's Summary of Glycemic Recommendations for Adults with Diabetes: Hemoglobin A1c <7.0%. More stringent glycemic goals (A1c <6.0%) may further reduce complications at the cost of increased risk of hypoglycemia.     CBG: Recent Labs  Lab 01/21/24 0335  GLUCAP 166*    Review of Systems:   Unable to be obtained secondary to the patient's intubated and sedated status.   Past Medical History  He,  has  a past medical history of Atrial fibrillation (HCC), COPD (chronic obstructive pulmonary disease) (HCC), Hypertension, Near syncope, and Tremors of nervous system.   Surgical History    Past Surgical History:  Procedure Laterality Date   BACK SURGERY     BRAIN SURGERY     stimulator placement for tremors   EYE SURGERY Left 2020   cataract surgery nov    EYE SURGERY Right 2020   cataract surgery dec   Social History   reports that he quit smoking about 45 years ago. His smoking use included cigarettes. He has never used smokeless tobacco. He reports current alcohol use. He reports that he does not use drugs.   Family History   His family history includes Obesity in his sister; Tremor in his paternal grandmother; Tuberculosis in his mother. There is no history of Prostate cancer, Bladder Cancer, or Kidney cancer.   Allergies No Known Allergies   Home Medications  Prior to Admission medications   Medication Sig Start Date End Date Taking? Authorizing Provider  allopurinol (ZYLOPRIM) 100 MG tablet Take 1 tablet (100 mg total) by mouth daily. Monday, Wednesday, Friday 07/01/23   Olevia Perches P, DO  amLODipine (NORVASC) 5 MG tablet Take 1 tablet (5 mg total) by mouth daily. 09/30/23 09/29/24  Laurier Nancy, MD   fluticasone-salmeterol (ADVAIR) 250-50 MCG/ACT AEPB INHALE 1 PUFF INTO THE LUNGS TWICE DAILY 12/23/23   Olevia Perches P, DO  furosemide (LASIX) 20 MG tablet TAKE 1 TABLET DAILY. 09/01/23   Laurier Nancy, MD  losartan (COZAAR) 100 MG tablet Take 1 tablet (100 mg total) by mouth daily. 11/21/23   Johnson, Megan P, DO  magnesium oxide (MAG-OX) 400 MG tablet Take 400 mg by mouth daily.    [provider]  memantine (NAMENDA) 5 MG tablet Take 5mg  once daily for one week, then increase to 5mg  twice daily for memory 10/26/23   [provider]  metoprolol succinate (TOPROL-XL) 50 MG 24 hr tablet Take 1 tablet (50 mg total) by mouth daily. 11/18/23   Johnson, Megan P, DO  metoprolol tartrate (LOPRESSOR) 50 MG tablet Take by mouth.    [provider]  Multiple Vitamins-Minerals (ZINC PO) Take 1 tablet by mouth daily.    [provider]  simvastatin (ZOCOR) 20 MG tablet Take 1 tablet (20 mg total) by mouth daily. 10/27/23   Johnson, Megan P, DO  triamcinolone cream (KENALOG) 0.1 % Apply 1 Application topically 2 (two) times daily. 10/31/23   Johnson, Megan P, DO  XARELTO 15 MG TABS tablet Take 1 tablet (15 mg total) by mouth daily with supper. 12/20/23   Laurier Nancy, MD  Zinc 50 MG TABS Take 50 mg by mouth daily.     [provider]  Scheduled Meds:  fentaNYL (SUBLIMAZE) injection  25 mcg Intravenous Once   LORazepam       Continuous Infusions:  fentaNYL infusion INTRAVENOUS     norepinephrine (LEVOPHED) Adult infusion 5 mcg/min (01/21/24 0251)   propofol (DIPRIVAN) infusion     PRN Meds:.Heparin (Porcine) in NaCl, heparin sodium (porcine), iohexol, lidocaine (PF), LORazepam, midazolam, verapamil   Active Hospital Problem list   See systems below  Assessment & Plan:  #In-Hospital PEA Cardiac Arrest following witnessed seizure activity -Unknown etiology, workup underway.  Initial concerns for STEMI, High-sensitivity troponin negative, EKG showed ST  elevation in V1 through V3  s/p cath s/p coronary angiogram no intervention  -Normothermia protocol -trend troponin until peaked -trend Lactate -Optimize electrolytes -  Obtain echocardiogram -Cardiology consult  #Acute Hypoxemic Respiratory Failure 2/2 above,  #Aspiration Event History of COPD -Continue ventilator support with lung protective strategies  -Wean PEEP and FiO2 for sats greater than 90%. -Head of bed elevated 30 degrees. -Plateau pressures less than 30 cm H20.  -Follow intermittent chest x-ray and ABG.   -SAT/SBT as tolerated, mentation precludes extubation  -Ensure adequate pulmonary hygiene  -Follow cultures  -VAP bundle in place  -PAD protocol -Empiric antibiotics as below  #Suspected Severe Sepsis with Undifferentiated shock #Aspiration Pneumonia ?Pulmonary Edema -Workup pending but acute concern for septic versus cardiogenic shock.  Lactic >6 on arrival with mild leukocytosis 13.7.  Chest x-ray relatively unremarkable but CT chest abdomen and pelvis concerning for infectious process -F/u cultures, trend lactic/ PCT -Monitor WBC/ fever curve -IV antibiotics: Zosyn -IVF hydration as needed: -Pressors for MAP goal >65 -Strict I/O's   #Seizure Activity #At risk for anoxic encephalopathy. Hx of Essential Tremors s/p DBS placement in 2006 however it is currently turned off by Neurosurgeon per patient's request. Estimated downtime 15 minutes,  -Initial CTH negative -Maintain neuro protective measures -Apply Ceribell pending EEG in the am -Load with Keppra then then start maintenance dose pending neurology input -Seizure precautions -Propofol and PRN Versed for burst suppression -Neurology Consult  #Essential Hypertension #Hyperlipidemia -Hold BP meds for now -Continue Simvastatin 20 mg  #Right subcapital femoral neck fracture  -Ortho consult    Best practice:  Diet:  NPO Pain/Anxiety/Delirium protocol (if indicated): Yes (RASS goal -1) VAP protocol  (if indicated): Yes DVT prophylaxis: Contraindicated GI prophylaxis: H2B Glucose control:  SSI Yes Central venous access:  N/A Arterial line:  N/A Foley:  N/A Mobility:  bed rest  PT consulted: N/A Last date of multidisciplinary goals of care discussion [update family at bedside 01/21/24] Code Status:  full code Disposition: ICU   = Goals of Care = Code Status Order:full  Primary Emergency ContactJaedyn, Lard, Home Phone: [phone]   Critical care time: 45 minutes        [name] DNP, CCRN, FNP-C, AGACNP-BC Acute Care & Family Nurse Practitioner [facility] PCCM on call pager [phone]

## 2024-01-21 NOTE — Progress Notes (Signed)
 Pharmacy Antibiotic Note  Carlos Richard is a 89 y.o. male admitted on 01/21/2024 with STEMI and hip fracture.  Patient has PMH of A. Fib, HTN, COPD, left thalamic DBS stimulator for ET. Pharmacy has been consulted for Zosyn dosing for aspiration pneumonia.  Plan: Start Zosyn 3.375g IV q8h (4 hour infusion).  Height: 5' 9.02" (175.3 cm) Weight: 71.2 kg (156 lb 15.5 oz) IBW/kg (Calculated) : 70.74  No data recorded.  Recent Labs  Lab 01/21/24 0254 01/21/24 0350 01/21/24 0609  WBC 14.9*  --   --   CREATININE  --  1.47*  --   LATICACIDVEN  --   --  6.3*    Estimated Creatinine Clearance: 34.1 mL/min (A) (by C-G formula based on SCr of 1.47 mg/dL (H)).    No Known Allergies  Antimicrobials this admission: 3/8 Azithromycin x 1 3/8 Zosyn>>   Microbiology results: 3/8 BCx: pending  Thank you for allowing pharmacy to be a part of this patient's care.  Gardner Candle, PharmD, BCPS Clinical Pharmacist 01/21/2024 7:33 AM

## 2024-01-21 NOTE — Progress Notes (Signed)
   01/21/24 0700  Spiritual Encounters  Type of Visit Initial  Care provided to: Pt and family  Conversation partners present during encounter Nurse;Physician  Referral source Code page  Reason for visit Code  OnCall Visit Yes  Spiritual Framework  Presenting Themes Significant life change;Impactful experiences and emotions  Community/Connection Family;Friend(s)  Interventions  Spiritual Care Interventions Made Established relationship of care and support;Compassionate presence;Reflective listening;Encouragement  Intervention Outcomes  Outcomes Connection to spiritual care  Spiritual Care Plan  Spiritual Care Issues Still Outstanding Referring to oncoming chaplain for further support   Chaplain provided compassionate presence and encouragement to the family.

## 2024-01-21 NOTE — Consult Note (Signed)
 CARDIOLOGY CONSULT NOTE               Patient ID: Carlos Richard MRN: [id number] DOB/AGE: [birth year] 89 y.o.  Admit date: 01/21/2024 Referring Physician Dr. Pilar Jarvis  ER Primary Physician Olevia Perches primary Primary Cardiologist Dr. Adrian Blackwater cardiologist Reason for Consultation abnormal EKG respiratory arrest code STEMI  HPI: 89 year old male history of atrial fibrillation followed by Dr. Welton Flakes hypertension altered mental status presented as a code STEMI patient had atypical pain at home was brought in by EMS with abnormal EKG while in CT for chest CT rule out dissection he had a respiratory arrest episode requiring intubation CODE BLUE with compressions subsequently was able to regain ROSC briefly had PEA was not shocked subsequently brought to the cardiac Cath Lab and was found to have no significant obstructive disease and preserved left ventricular function patient was transferred up to ICU for further management and care.  He has a longstanding history of atrial fibrillation was on Xarelto followed by Dr. Welton Flakes no coronary disease history according to the family.  Patient had subsequent elevation of troponin up to thousand possible demand ischemia  Review of systems complete and found to be negative unless listed above     Past Medical History:  Diagnosis Date   Atrial fibrillation (HCC)    COPD (chronic obstructive pulmonary disease) (HCC)    Hypertension    Near syncope    Tremors of nervous system     Past Surgical History:  Procedure Laterality Date   BACK SURGERY     BRAIN SURGERY     stimulator placement for tremors   EYE SURGERY Left 2020   cataract surgery nov    EYE SURGERY Right 2020   cataract surgery dec    Facility-Administered Medications Prior to Admission  Medication Dose Route Frequency Provider Last Rate Last Admin   cyanocobalamin (VITAMIN B12) injection 1,000 mcg  1,000 mcg Intramuscular Q30 days Laural Benes, Megan P, DO   1,000 mcg at  12/01/23 4098   Medications Prior to Admission  Medication Sig Dispense Refill Last Dose/Taking   allopurinol (ZYLOPRIM) 100 MG tablet Take 1 tablet (100 mg total) by mouth daily. Monday, Wednesday, Friday 90 tablet 1    amLODipine (NORVASC) 5 MG tablet Take 1 tablet (5 mg total) by mouth daily. 30 tablet 11    fluticasone-salmeterol (ADVAIR) 250-50 MCG/ACT AEPB INHALE 1 PUFF INTO THE LUNGS TWICE DAILY 180 each 0    furosemide (LASIX) 20 MG tablet TAKE 1 TABLET DAILY. 90 tablet 0    losartan (COZAAR) 100 MG tablet Take 1 tablet (100 mg total) by mouth daily. 90 tablet 0    magnesium oxide (MAG-OX) 400 MG tablet Take 400 mg by mouth daily.      memantine (NAMENDA) 5 MG tablet Take 5mg  once daily for one week, then increase to 5mg  twice daily for memory      metoprolol succinate (TOPROL-XL) 50 MG 24 hr tablet Take 1 tablet (50 mg total) by mouth daily. 90 tablet 0    metoprolol tartrate (LOPRESSOR) 50 MG tablet Take by mouth.      Multiple Vitamins-Minerals (ZINC PO) Take 1 tablet by mouth daily.      simvastatin (ZOCOR) 20 MG tablet Take 1 tablet (20 mg total) by mouth daily. 90 tablet 1    triamcinolone cream (KENALOG) 0.1 % Apply 1 Application topically 2 (two) times daily. 90 g 1    XARELTO 15 MG TABS tablet Take 1 tablet (15  mg total) by mouth daily with supper. 90 tablet 3    Zinc 50 MG TABS Take 50 mg by mouth daily.       Social History   Socioeconomic History   Marital status: Married    Spouse name: Not on file   Number of children: Not on file   Years of education: Not on file   Highest education level: Associate degree: academic program  Occupational History   Not on file  Tobacco Use   Smoking status: Former    Current packs/day: 0.00    Types: Cigarettes    Quit date: 1980    Years since quitting: 45.2   Smokeless tobacco: Never   Tobacco comments:    quit 40 years ago   Vaping Use   Vaping status: Never Used  Substance and Sexual Activity   Alcohol use: Yes     Comment: occasionally   Drug use: No   Sexual activity: Yes  Other Topics Concern   Not on file  Social History Narrative   Not on file   Social Drivers of Health   Financial Resource Strain: Low Risk  (03/08/2023)   Overall Financial Resource Strain (CARDIA)    Difficulty of Paying Living Expenses: Not hard at all  Food Insecurity: No Food Insecurity (05/10/2023)   Hunger Vital Sign    Worried About Running Out of Food in the Last Year: Never true    Ran Out of Food in the Last Year: Never true  Transportation Needs: No Transportation Needs (05/10/2023)   PRAPARE - Administrator, Civil Service (Medical): No    Lack of Transportation (Non-Medical): No  Physical Activity: Insufficiently Active (03/08/2023)   Exercise Vital Sign    Days of Exercise per Week: 2 days    Minutes of Exercise per Session: 20 min  Stress: No Stress Concern Present (03/08/2023)   [employer] of Occupational Health - Occupational Stress Questionnaire    Feeling of Stress : Not at all  Social Connections: Moderately Isolated (03/08/2023)   Social Connection and Isolation Panel [NHANES]    Frequency of Communication with Friends and Family: More than three times a week    Frequency of Social Gatherings with Friends and Family: Never    Attends Religious Services: Never    Database administrator or Organizations: No    Attends Banker Meetings: Never    Marital Status: Married  Catering manager Violence: Not At Risk (05/10/2023)   Humiliation, Afraid, Rape, and Kick questionnaire    Fear of Current or Ex-Partner: No    Emotionally Abused: No    Physically Abused: No    Sexually Abused: No    Family History  Problem Relation Age of Onset   Tuberculosis Mother    Tremor Paternal Grandmother    Obesity Sister    Prostate cancer Neg Hx    Bladder Cancer Neg Hx    Kidney cancer Neg Hx       Review of systems complete and found to be negative unless listed above       PHYSICAL EXAM  General: Well developed, well nourished, in no acute distress HEENT:  Normocephalic and atramatic Neck:  No JVD.  Lungs: Clear bilaterally to auscultation and percussion. Heart: HRRR . Normal S1 and S2 without gallops or murmurs.  Abdomen: Bowel sounds are positive, abdomen soft and non-tender  Msk:  Back normal, normal gait. Normal strength and tone for age. Extremities: No clubbing, cyanosis or  edema.   Neuro: Alert and oriented X 3. Psych:  Good affect, responds appropriately  Labs:   Lab Results  Component Value Date   WBC 24.7 (H) 01/21/2024   HGB 13.2 01/21/2024   HCT 39.3 01/21/2024   MCV 92.3 01/21/2024   PLT 196 01/21/2024    Recent Labs  Lab 01/21/24 1258  NA 139  K 3.6  CL 109  CO2 21*  BUN 16  CREATININE 1.76*  CALCIUM 8.2*  PROT 5.3*  BILITOT 1.1  ALKPHOS 57  ALT 118*  AST 146*  GLUCOSE 195*   Lab Results  Component Value Date   TROPONINI <0.03 07/03/2015    Lab Results  Component Value Date   CHOL 143 07/01/2023   CHOL 144 12/14/2022   CHOL 134 06/15/2022   Lab Results  Component Value Date   HDL 42 07/01/2023   HDL 39 (L) 12/14/2022   HDL 43 06/15/2022   Lab Results  Component Value Date   LDLCALC 76 07/01/2023   LDLCALC 77 12/14/2022   LDLCALC 71 06/15/2022   Lab Results  Component Value Date   TRIG 143 07/01/2023   TRIG 162 (H) 12/14/2022   TRIG 106 06/15/2022   No results found for: "CHOLHDL" No results found for: "LDLDIRECT"    Radiology: ECHOCARDIOGRAM COMPLETE Result Date: 01/21/2024    ECHOCARDIOGRAM REPORT   Patient Name:   MATHEAU ORONA Date of Exam: 01/21/2024 Medical Rec #:  [id number]        Height:       69.0 in Accession #:    [id number]       Weight:       157.0 lb Date of Birth:  [birth year]        BSA:          1.864 m Patient Age:    89 years         BP:           92/66 mmHg Patient Gender: M                HR:           70 bpm. Exam Location:  [facility] Procedure: 2D Echo, Cardiac Doppler and  Color Doppler (Both Spectral and Color            Flow Doppler were utilized during procedure). STAT ECHO Indications:     Shock R57.9  History:         Patient has prior history of Echocardiogram examinations, most                  recent 03/21/2023.  Sonographer:     Overton Mam RDCS, FASE Referring Phys:  [id number] KHABIB DGAYLI Diagnosing Phys: Alwyn Pea MD  Sonographer Comments: Technically difficult study due to poor echo windows and echo performed with patient supine and on artificial respirator. IMPRESSIONS  1. Left ventricular ejection fraction, by estimation, is 50 to 55%. The left ventricle has low normal function. The left ventricle has no regional wall motion abnormalities. Left ventricular diastolic parameters were normal.  2. Right ventricular systolic function is mildly reduced. The right ventricular size is moderately enlarged. Mildly increased right ventricular wall thickness.  3. The mitral valve is normal in structure. Trivial mitral valve regurgitation.  4. The aortic valve is normal in structure. Aortic valve regurgitation is mild. Aortic valve sclerosis is present, with no evidence of aortic valve stenosis. Conclusion(s)/Recommendation(s): Poor windows for evaluation of left ventricular function by  transthoracic echocardiography. Would recommend an alternative means of evaluation. FINDINGS  Left Ventricle: Left ventricular ejection fraction, by estimation, is 50 to 55%. The left ventricle has low normal function. The left ventricle has no regional wall motion abnormalities. Strain was performed and the global longitudinal strain is indeterminate. Global longitudinal strain performed but not reported based on interpreter judgement due to suboptimal tracking. The left ventricular internal cavity size was normal in size. There is no left ventricular hypertrophy. Left ventricular diastolic parameters were normal. Right Ventricle: The right ventricular size is moderately enlarged. Mildly  increased right ventricular wall thickness. Right ventricular systolic function is mildly reduced. Left Atrium: Left atrial size was normal in size. Right Atrium: Right atrial size was normal in size. Pericardium: There is no evidence of pericardial effusion. Mitral Valve: The mitral valve is normal in structure. Trivial mitral valve regurgitation. Tricuspid Valve: The tricuspid valve is normal in structure. Tricuspid valve regurgitation is mild. Aortic Valve: The aortic valve is normal in structure. Aortic valve regurgitation is mild. Aortic valve sclerosis is present, with no evidence of aortic valve stenosis. Aortic valve peak gradient measures 5.2 mmHg. Pulmonic Valve: The pulmonic valve was normal in structure. Pulmonic valve regurgitation is not visualized. Aorta: The ascending aorta was not well visualized. IAS/Shunts: No atrial level shunt detected by color flow Doppler. Additional Comments: 3D was performed not requiring image post processing on an independent workstation and was indeterminate.  LEFT VENTRICLE PLAX 2D LVIDd:         3.70 cm     Diastology LVIDs:         2.70 cm     LV e' medial:    5.11 cm/s LV PW:         1.10 cm     LV E/e' medial:  10.2 LV IVS:        1.10 cm     LV e' lateral:   5.66 cm/s LVOT diam:     2.10 cm     LV E/e' lateral: 9.2 LV SV:         56 LV SV Index:   30 LVOT Area:     3.46 cm  LV Volumes (MOD) LV vol d, MOD A4C: 31.1 ml LV vol s, MOD A4C: 13.1 ml LV SV MOD A4C:     31.1 ml RIGHT VENTRICLE RV Basal diam:  4.30 cm RV S prime:     9.90 cm/s TAPSE (M-mode): 1.5 cm LEFT ATRIUM           Index        RIGHT ATRIUM           Index LA diam:      2.50 cm 1.34 cm/m   RA Area:     17.90 cm LA Vol (A2C): 6.4 ml  3.46 ml/m   RA Volume:   47.60 ml  25.54 ml/m LA Vol (A4C): 21.0 ml 11.27 ml/m  AORTIC VALVE                 PULMONIC VALVE AV Area (Vmax): 3.01 cm     PV Vmax:        0.77 m/s AV Vmax:        114.00 cm/s  PV Peak grad:   2.4 mmHg AV Peak Grad:   5.2 mmHg     RVOT  Peak grad: 1 mmHg LVOT Vmax:      99.00 cm/s LVOT Vmean:     68.200 cm/s LVOT VTI:  0.162 m  AORTA Ao Root diam: 3.60 cm Ao Asc diam:  3.50 cm MITRAL VALVE               TRICUSPID VALVE MV Area (PHT): 3.27 cm    TR Peak grad:   30.0 mmHg MV Decel Time: 232 msec    TR Vmax:        274.00 cm/s MV E velocity: 51.90 cm/s MV A velocity: 53.50 cm/s  SHUNTS MV E/A ratio:  0.97        Systemic VTI:  0.16 m                            Systemic Diam: 2.10 cm Alwyn Pea MD Electronically signed by Alwyn Pea MD Signature Date/Time: 01/21/2024/12:59:54 PM    Final    DG Abd 1 View Result Date: 01/21/2024 CLINICAL DATA:  [id number] Encounter for orogastric (OG) tube placement [id number] EXAM: ABDOMEN - 1 VIEW COMPARISON:  Abdominal radiograph from earlier today FINDINGS: Enteric tube tip is seen in the lower thoracic esophagus. External pacer pads overlie the left lower chest. Top-normal caliber small bowel loops in the visualized abdomen with no evidence of pneumatosis or pneumoperitoneum. Excreted contrast noted in the nondilated renal collecting systems. Similar mild patchy bibasilar lung opacities. IMPRESSION: Enteric tube tip in the lower thoracic esophagus, recommend replacement or advancement. Per the technologist notes, the provider was present for the imaging and removed the OG tube after the radiograph. Electronically Signed   By: Delbert Phenix M.D.   On: 01/21/2024 10:25   DG Abd 1 View Result Date: 01/21/2024 CLINICAL DATA:  89 year old male central line placement. EXAM: ABDOMEN - 1 VIEW COMPARISON:  Portable chest 0639 hours reported separately. FINDINGS: AP view of the lower chest and upper abdomen at 0639 hours. Unchanged appearance of right IJ central line as described separately. As on the portable chest no enteric tube is identified. Pacer/resuscitation pads project over the medial left diaphragm. Coarse asymmetric right lung opacity again noted. Stable cardiac size and mediastinal contours. Mild  gaseous distension of the stomach. Persistent bilateral nephrograms also with some contrast excretion into nondilated collecting systems. IMPRESSION: 1. No enteric tube identified. Mild gaseous distension of the stomach. Consider enteric tube placement. 2. Right IJ central line placement as described on portable chest separately. 3. Persistent bilateral nephrograms suggesting acute or chronic renal insufficiency. 4. Abnormal right lung opacity as described separately. Electronically Signed   By: Odessa Fleming M.D.   On: 01/21/2024 07:00   DG Chest Port 1 View Result Date: 01/21/2024 CLINICAL DATA:  89 year old male central line placement. EXAM: PORTABLE CHEST 1 VIEW COMPARISON:  CTA chest abdomen and pelvis 0324 hours today. FINDINGS: Portable AP upright view at 0639 hours. Intubated now. Endotracheal tube tip in good position just below the clavicles. No enteric tube identified. New right IJ approach vascular catheter, tip in good position near the expected level of the cavoatrial junction. Stable cardiac size and mediastinal contours. Left chest generator device with electrical leads extending up into the chest again noted. Coarse, confluent, asymmetric pulmonary opacity superimposed on emphysema with left lung base hyperinflation and/or bullous changes as seen by CT. No pneumothorax or pleural effusion identified. IMPRESSION: 1. Satisfactory endotracheal tube and right IJ approach vascular catheter placement. No pneumothorax. 2. Emphysema with extensive right lung opacity as characterized by CT this morning. Electronically Signed   By: Odessa Fleming M.D.   On: 01/21/2024 06:58  CT Angio Chest/Abd/Pel for Dissection W and/or Wo Contrast Result Date: 01/21/2024 CLINICAL DATA:  Acute aortic syndrome EXAM: CT ANGIOGRAPHY CHEST, ABDOMEN AND PELVIS TECHNIQUE: Non-contrast CT of the chest was initially obtained. Multidetector CT imaging through the chest, abdomen and pelvis was performed using the standard protocol during  bolus administration of intravenous contrast. Multiplanar reconstructed images and MIPs were obtained and reviewed to evaluate the vascular anatomy. RADIATION DOSE REDUCTION: This exam was performed according to the departmental dose-optimization program which includes automated exposure control, adjustment of the mA and/or kV according to patient size and/or use of iterative reconstruction technique. CONTRAST:  OMNIPAQUE IOHEXOL 350 MG/ML SOLN COMPARISON:  None Available. FINDINGS: CTA CHEST FINDINGS Cardiovascular: No significant coronary artery calcification. Global cardiac size within normal limits. No pericardial effusion. The central pulmonary arteries are mildly enlarged suggesting changes of pulmonary arterial hypertension. No intraluminal filling defect identified through the segmental level to suggest acute pulmonary embolism. The thoracic aorta is of normal caliber. No intramural hematoma, dissection, or aneurysm. Mild atherosclerotic calcification. Arch vasculature demonstrates normal anatomic configuration and is widely patent proximally. Mediastinum/Nodes: No enlarged mediastinal, hilar, or axillary lymph nodes. Thyroid gland, trachea, and esophagus demonstrate no significant findings. Small hiatal hernia. Lungs/Pleura: Severe emphysema. Superimposed interstitial and airspace infiltrate throughout the left upper lobe and right lung, likely infectious in the acute setting. Hyper lucency of the left lower lobe may relate to compensatory hyperinflation, asymmetric emphysema or the sequela of remote viral infection. There is no pneumothorax or pleural effusion. Marked narrowing of the mainstem bronchi, left lower lobar bronchus and bronchus intermedius in keeping with changes bronchomalacia. Musculoskeletal: No acute bone abnormality. Remote healed left humeral head fracture. Review of the MIP images confirms the above findings. CTA ABDOMEN AND PELVIS FINDINGS VASCULAR Aorta: Normal caliber aorta  without aneurysm, dissection, vasculitis or significant stenosis. Moderate atherosclerotic calcification. Celiac: Patent without evidence of aneurysm, dissection, vasculitis or significant stenosis. SMA: Patent without evidence of aneurysm, dissection, vasculitis or significant stenosis. Renals: Both renal arteries are patent without evidence of aneurysm, dissection, vasculitis, fibromuscular dysplasia or significant stenosis. IMA: Patent without evidence of aneurysm, dissection, vasculitis or significant stenosis. Inflow: Patent without evidence of aneurysm, dissection, vasculitis or significant stenosis. Veins: The renal veins and suprarenal inferior vena cava appear relatively slit-like, finding that can be seen with intravascular depletion. The venous structures are otherwise unremarkable on this arterial phase examination. Review of the MIP images confirms the above findings. NON-VASCULAR Hepatobiliary: Cholelithiasis without superimposed pericholecystic inflammatory change. Liver unremarkable; no enhancing intrahepatic mass identified. No intra or extrahepatic biliary ductal dilation. Pancreas: Unremarkable Spleen: Unremarkable Adrenals/Urinary Tract: The adrenal glands are unremarkable. The kidneys are unremarkable save for simple cortical cysts bilaterally for which no follow-up imaging is recommended. The bladder appears mildly distend and trabeculated in keeping with changes bladder outlet obstruction. Stomach/Bowel: Stomach is within normal limits. Appendix appears normal. No evidence of bowel wall thickening, distention, or inflammatory changes. Lymphatic: No pathologic at abdomen and pelvis. Reproductive: Moderate prostatic hypertrophy. The prostate gland is partially obscured by streak artifact. Other: Small fat containing hernias. Musculoskeletal: There is a acute to subacute appearing right subcapital femoral neck fracture with impaction and external rotation of the distal fracture fragment. The  femoral head is still seated right acetabulum with superimposed moderate right hip degenerative arthritis. Left hip bipolar hemiarthroplasty has been performed. Degenerative changes seen within the lumbar spin. Review of the MIP images confirms the above findings. IMPRESSION: 1. No evidence of aortic dissection or aneurysm. No  evidence of acute pulmonary embolism. 2. Acute to subacute appearing right subcapital femoral neck fracture with impaction and external rotation of the distal fracture fragment. 3. Severe emphysema. 4. Extensive airspace infiltrate most in keeping with acute infection the appropriate clinical setting. 5. Marked narrowing of the mainstem bronchi, left lower lobar bronchus and bronchus intermedius in keeping with changes bronchomalacia. 6. The renal veins and suprarenal inferior vena cava appear relatively slit-like, finding that can be seen with intravascular depletion. 7. Cholelithiasis. 8. Moderate prostatic hypertrophy with associated bladder outlet obstruction. These results were called by telephone at the time of interpretation on 01/21/2024 at 4:07 am to provider [facility] , who verbally acknowledged these results. Aortic Atherosclerosis (ICD10-I70.0) and Emphysema (ICD10-J43.9). Electronically Signed   By: Helyn Numbers M.D.   On: 01/21/2024 04:07   CT Head Wo Contrast Result Date: 01/21/2024 CLINICAL DATA:  Mental status change, unknown cause EXAM: CT HEAD WITHOUT CONTRAST TECHNIQUE: Contiguous axial images were obtained from the base of the skull through the vertex without intravenous contrast. RADIATION DOSE REDUCTION: This exam was performed according to the departmental dose-optimization program which includes automated exposure control, adjustment of the mA and/or kV according to patient size and/or use of iterative reconstruction technique. COMPARISON:  CT head 10/19/2023. FINDINGS: Brain: No evidence of acute infarction, hemorrhage, hydrocephalus, extra-axial collection or mass  lesion/mass effect. Similar patchy white matter hypodensities, compatible with chronic microvascular ischemic disease. Similar position of a left deep brain stimulator lead without complicating feature. Similar cerebral atrophy. Vascular: No hyperdense vessel. Skull: No acute fracture. Sinuses/Orbits: No acute finding. Other: No mastoid effusions. IMPRESSION: 1. No evidence of acute intracranial abnormality. 2. Similar chronic microvascular ischemic disease and left deep brain stimulator. Electronically Signed   By: Feliberto Harts M.D.   On: 01/21/2024 03:43    EKG: Atrial fibrillation 65 nonspecific ST-T wave changes  ASSESSMENT AND PLAN:  Respiratory failure Possible seizure activity Abnormal EKG presented as STEMI Renal insufficiency stage III Atrial fibrillation COPD Hypertension Tremors  Near syncope with falls Elevated troponin possible demand ischemia . Plan Agree with critical care management support and vent wean when appropriate Continue supplemental oxygen inhalers as necessary Adequate hydration for renal insufficiency consider nephrology input Resume systemic anticoagulation for history of atrial fibrillation was on Xarelto Inhalers as necessary for COPD Hypertension management and control as necessary Rate control for atrial fibrillation beta-blocker or calcium blocker Cardiac cath failed to show any significant obstructive disease with preserved left ventricular function recommend conservative cardiac input at this stage No evidence of primary high-grade arrhythmia besides atrial fibrillation Continue amiodarone to help with rhythm management Commend conservative cardiac input at this point  Signed: Alwyn Pea MD 01/21/2024, 3:18 PM

## 2024-01-21 NOTE — Procedures (Signed)
 CENTRAL VENOUS CATHETER INSERTION PROCEDURE NOTE  EMAD BRECHTEL  [id number]  September 14, 1932  Date:01/21/24  Time:7:16 AM   Provider Performing:Tamara Monteith A Anna Genre   Procedure: Insertion of Non-tunneled Central Venous Catheter(36556) with US guidance (40981)   Indication(s) Medication administration and Difficult access  Consent Unable to obtain consent due to emergent nature of procedure.  Anesthesia Topical only with 1% lidocaine   Timeout Verified patient identification, verified procedure, site/side was marked, verified correct patient position, special equipment/implants available, medications/allergies/relevant history reviewed, required imaging and test results available.  Sterile Technique Maximal sterile technique including full sterile barrier drape, hand hygiene, sterile gown, sterile gloves, mask, hair covering, sterile ultrasound probe cover (if used).  Procedure Description Area of catheter insertion was cleaned with chlorhexidine and draped in sterile fashion.  With real-time ultrasound guidance a central venous catheter was placed into the right internal jugular vein. Nonpulsatile blood flow and easy flushing noted in all ports.  The catheter was sutured in place and sterile dressing applied.  Complications/Tolerance None; patient tolerated the procedure well. Chest X-ray is ordered to verify placement for internal jugular or subclavian cannulation.   Chest x-ray is not ordered for femoral cannulation.  EBL Minimal  Specimen(s) None  [name], DNP, CCRN, FNP-C, AGACNP-BC Acute Care & Family Nurse Practitioner  [facility]  See Amion for personal pager PCCM on call pager [phone] until 7 am

## 2024-01-22 DIAGNOSIS — I469 Cardiac arrest, cause unspecified: Secondary | ICD-10-CM | POA: Diagnosis not present

## 2024-01-22 DIAGNOSIS — S72001A Fracture of unspecified part of neck of right femur, initial encounter for closed fracture: Secondary | ICD-10-CM | POA: Diagnosis not present

## 2024-01-22 DIAGNOSIS — R579 Shock, unspecified: Secondary | ICD-10-CM

## 2024-01-22 DIAGNOSIS — J9601 Acute respiratory failure with hypoxia: Secondary | ICD-10-CM | POA: Diagnosis not present

## 2024-01-22 DIAGNOSIS — I4891 Unspecified atrial fibrillation: Secondary | ICD-10-CM

## 2024-01-22 LAB — CBC
HCT: 39.2 % (ref 39.0–52.0)
Hemoglobin: 13.2 g/dL (ref 13.0–17.0)
MCH: 31.3 pg (ref 26.0–34.0)
MCHC: 33.7 g/dL (ref 30.0–36.0)
MCV: 92.9 fL (ref 80.0–100.0)
Platelets: 188 10*3/uL (ref 150–400)
RBC: 4.22 MIL/uL (ref 4.22–5.81)
RDW: 13.9 % (ref 11.5–15.5)
WBC: 22.9 10*3/uL — ABNORMAL HIGH (ref 4.0–10.5)
nRBC: 0 % (ref 0.0–0.2)

## 2024-01-22 LAB — GLUCOSE, CAPILLARY
Glucose-Capillary: 150 mg/dL — ABNORMAL HIGH (ref 70–99)
Glucose-Capillary: 155 mg/dL — ABNORMAL HIGH (ref 70–99)
Glucose-Capillary: 160 mg/dL — ABNORMAL HIGH (ref 70–99)
Glucose-Capillary: 168 mg/dL — ABNORMAL HIGH (ref 70–99)
Glucose-Capillary: 174 mg/dL — ABNORMAL HIGH (ref 70–99)
Glucose-Capillary: 175 mg/dL — ABNORMAL HIGH (ref 70–99)
Glucose-Capillary: 199 mg/dL — ABNORMAL HIGH (ref 70–99)

## 2024-01-22 LAB — BLOOD GAS, ARTERIAL
Acid-base deficit: 2.7 mmol/L — ABNORMAL HIGH (ref 0.0–2.0)
Bicarbonate: 22.6 mmol/L (ref 20.0–28.0)
FIO2: 50 %
MECHVT: 400 mL
Mechanical Rate: 22
O2 Saturation: 99.9 %
PEEP: 10 cmH2O
Patient temperature: 37
pCO2 arterial: 40 mmHg (ref 32–48)
pH, Arterial: 7.36 (ref 7.35–7.45)
pO2, Arterial: 119 mmHg — ABNORMAL HIGH (ref 83–108)

## 2024-01-22 LAB — CARDIAC CATHETERIZATION: Cath EF Quantitative: 55 %

## 2024-01-22 LAB — APTT
aPTT: >200 s (ref 24–36)
aPTT: 199 s (ref 24–36)
aPTT: 105 s — ABNORMAL HIGH (ref 24–36)

## 2024-01-22 LAB — BASIC METABOLIC PANEL
Anion gap: 13 (ref 5–15)
BUN: 24 mg/dL — ABNORMAL HIGH (ref 8–23)
CO2: 21 mmol/L — ABNORMAL LOW (ref 22–32)
Calcium: 8.3 mg/dL — ABNORMAL LOW (ref 8.9–10.3)
Chloride: 107 mmol/L (ref 98–111)
Creatinine, Ser: 2.39 mg/dL — ABNORMAL HIGH (ref 0.61–1.24)
GFR, Estimated: 25 mL/min — ABNORMAL LOW (ref >60)
Glucose, Bld: 173 mg/dL — ABNORMAL HIGH (ref 70–99)
Potassium: 4.4 mmol/L (ref 3.5–5.1)
Sodium: 141 mmol/L (ref 135–145)

## 2024-01-22 LAB — PHOSPHORUS: Phosphorus: 5.8 mg/dL — ABNORMAL HIGH (ref 2.5–4.6)

## 2024-01-22 LAB — HEPARIN LEVEL (UNFRACTIONATED): Heparin Unfractionated: >1.1 [IU]/mL — ABNORMAL HIGH (ref 0.30–0.70)

## 2024-01-22 LAB — MAGNESIUM: Magnesium: 2.8 mg/dL — ABNORMAL HIGH (ref 1.7–2.4)

## 2024-01-22 MED ORDER — ACETAMINOPHEN 10 MG/ML IV SOLN
1000.0000 mg | Freq: Four times a day (QID) | INTRAVENOUS | Status: DC | PRN
  Administered 2024-01-22: 1000 mg via INTRAVENOUS
  Filled 2024-01-22: qty 100

## 2024-01-22 MED ORDER — MIDAZOLAM HCL 2 MG/2ML IJ SOLN
2.0000 mg | Freq: Once | INTRAMUSCULAR | Status: AC
  Administered 2024-01-22: 2 mg via INTRAVENOUS

## 2024-01-22 MED ORDER — HEPARIN (PORCINE) 25000 UT/250ML-% IV SOLN
850.0000 [IU]/h | INTRAVENOUS | Status: DC
  Administered 2024-01-22: 850 [IU]/h via INTRAVENOUS

## 2024-01-22 MED ORDER — HEPARIN (PORCINE) 25000 UT/250ML-% IV SOLN
750.0000 [IU]/h | INTRAVENOUS | Status: DC
Start: 2024-01-22 — End: 2024-01-26
  Administered 2024-01-22: 650 [IU]/h via INTRAVENOUS
  Administered 2024-01-23: 550 [IU]/h via INTRAVENOUS
  Administered 2024-01-24: 750 [IU]/h via INTRAVENOUS
  Administered 2024-01-25: 1050 [IU]/h via INTRAVENOUS
  Filled 2024-01-22 (×3): qty 250

## 2024-01-22 MED ORDER — DEXTROSE IN LACTATED RINGERS 5 % IV SOLN: INTRAVENOUS | Status: DC

## 2024-01-22 MED ORDER — ACETAMINOPHEN 325 MG PO TABS: 650.0000 mg | ORAL_TABLET | Freq: Four times a day (QID) | ORAL | Status: DC | PRN

## 2024-01-22 MED ORDER — DEXMEDETOMIDINE HCL IN NACL 400 MCG/100ML IV SOLN
0.0000 ug/kg/h | INTRAVENOUS | Status: DC
Start: 2024-01-22 — End: 2024-01-23
  Administered 2024-01-22: 0.4 ug/kg/h via INTRAVENOUS
  Administered 2024-01-22: 0.6 ug/kg/h via INTRAVENOUS
  Filled 2024-01-22 (×2): qty 100

## 2024-01-22 MED ORDER — MIDAZOLAM HCL 2 MG/2ML IJ SOLN
INTRAMUSCULAR | Status: AC
Start: 2024-01-22 — End: 2024-01-22
  Filled 2024-01-22: qty 2

## 2024-01-22 NOTE — Progress Notes (Signed)
 Patient ID: Carlos Richard, male   DOB: Mar 20, 1932, 89 y.o.   MRN: [id number] [facility]    SUBJECTIVE: Intubated sedated   Vitals:   01/22/24 1315 01/22/24 1400 01/22/24 1415 01/22/24 1500  BP: (!) 112/58 (!) 112/56 98/61 96/60   Pulse: 77 77 81 78  Resp: 19 18 (!) 22 17  Temp:      TempSrc:      SpO2: 93% 96% 92% 95%  Weight:      Height:         Intake/Output Summary (Last 24 hours) at 01/22/2024 1606 Last data filed at 01/22/2024 1508 Gross per 24 hour  Intake 2059.88 ml  Output 415 ml  Net 1644.88 ml      PHYSICAL EXAM  General: Well developed, well nourished, in no acute distress HEENT:  Normocephalic and atramatic Neck:  No JVD.  Lungs: Clear bilaterally to auscultation and percussion. Heart: HRRR . Normal S1 and S2 without gallops or murmurs.  Abdomen: Bowel sounds are positive, abdomen soft and non-tender  Msk:  Back normal, normal gait. Normal strength and tone for age. Extremities: No clubbing, cyanosis or edema.   Neuro: Alert and oriented X 3. Psych:  Good affect, responds appropriately   LABS: Basic Metabolic Panel: Recent Labs    01/21/24 1258 01/22/24 0403  NA 139 141  K 3.6 4.4  CL 109 107  CO2 21* 21*  GLUCOSE 195* 173*  BUN 16 24*  CREATININE 1.76* 2.39*  CALCIUM 8.2* 8.3*  MG 2.6* 2.8*  PHOS 3.6 5.8*   Liver Function Tests: Recent Labs    01/21/24 0350 01/21/24 1258  AST 105* 146*  ALT 104* 118*  ALKPHOS 57 57  BILITOT 0.4 1.1  PROT 5.4* 5.3*  ALBUMIN 2.9* 2.9*   No results for input(s): "LIPASE", "AMYLASE" in the last 72 hours. CBC: Recent Labs    01/21/24 1258 01/22/24 0403  WBC 24.7* 22.9*  HGB 13.2 13.2  HCT 39.3 39.2  MCV 92.3 92.9  PLT 196 188   Cardiac Enzymes: Recent Labs    01/21/24 0904  CKMB 19.0*   BNP: Invalid input(s): "POCBNP" D-Dimer: No results for input(s): "DDIMER" in the last 72 hours. Hemoglobin A1C: Recent Labs    01/21/24 0609  HGBA1C 5.7*   Fasting Lipid Panel: No results  for input(s): "CHOL", "HDL", "LDLCALC", "TRIG", "CHOLHDL", "LDLDIRECT" in the last 72 hours. Thyroid Function Tests: Recent Labs    01/21/24 0350  TSH 2.726   Anemia Panel: No results for input(s): "VITAMINB12", "FOLATE", "FERRITIN", "TIBC", "IRON", "RETICCTPCT" in the last 72 hours.  ECHOCARDIOGRAM COMPLETE Result Date: 01/21/2024    ECHOCARDIOGRAM REPORT   Patient Name:   Carlos Richard Date of Exam: 01/21/2024 Medical Rec #:  [id number]        Height:       69.0 in Accession #:    [id number]       Weight:       157.0 lb Date of Birth:  Jun 10, 1932        BSA:          1.864 m Patient Age:    89 years         BP:           92/66 mmHg Patient Gender: M                HR:           70 bpm. Exam Location:  [facility] Procedure: 2D Echo, Cardiac Doppler  and Color Doppler (Both Spectral and Color            Flow Doppler were utilized during procedure). STAT ECHO Indications:     Shock R57.9  History:         Patient has prior history of Echocardiogram examinations, most                  recent 03/21/2023.  Sonographer:     Overton Mam RDCS, FASE Referring Phys:  [id number] KHABIB DGAYLI Diagnosing Phys: Alwyn Pea MD  Sonographer Comments: Technically difficult study due to poor echo windows and echo performed with patient supine and on artificial respirator. IMPRESSIONS  1. Left ventricular ejection fraction, by estimation, is 50 to 55%. The left ventricle has low normal function. The left ventricle has no regional wall motion abnormalities. Left ventricular diastolic parameters were normal.  2. Right ventricular systolic function is mildly reduced. The right ventricular size is moderately enlarged. Mildly increased right ventricular wall thickness.  3. The mitral valve is normal in structure. Trivial mitral valve regurgitation.  4. The aortic valve is normal in structure. Aortic valve regurgitation is mild. Aortic valve sclerosis is present, with no evidence of aortic valve stenosis.  Conclusion(s)/Recommendation(s): Poor windows for evaluation of left ventricular function by transthoracic echocardiography. Would recommend an alternative means of evaluation. FINDINGS  Left Ventricle: Left ventricular ejection fraction, by estimation, is 50 to 55%. The left ventricle has low normal function. The left ventricle has no regional wall motion abnormalities. Strain was performed and the global longitudinal strain is indeterminate. Global longitudinal strain performed but not reported based on interpreter judgement due to suboptimal tracking. The left ventricular internal cavity size was normal in size. There is no left ventricular hypertrophy. Left ventricular diastolic parameters were normal. Right Ventricle: The right ventricular size is moderately enlarged. Mildly increased right ventricular wall thickness. Right ventricular systolic function is mildly reduced. Left Atrium: Left atrial size was normal in size. Right Atrium: Right atrial size was normal in size. Pericardium: There is no evidence of pericardial effusion. Mitral Valve: The mitral valve is normal in structure. Trivial mitral valve regurgitation. Tricuspid Valve: The tricuspid valve is normal in structure. Tricuspid valve regurgitation is mild. Aortic Valve: The aortic valve is normal in structure. Aortic valve regurgitation is mild. Aortic valve sclerosis is present, with no evidence of aortic valve stenosis. Aortic valve peak gradient measures 5.2 mmHg. Pulmonic Valve: The pulmonic valve was normal in structure. Pulmonic valve regurgitation is not visualized. Aorta: The ascending aorta was not well visualized. IAS/Shunts: No atrial level shunt detected by color flow Doppler. Additional Comments: 3D was performed not requiring image post processing on an independent workstation and was indeterminate.  LEFT VENTRICLE PLAX 2D LVIDd:         3.70 cm     Diastology LVIDs:         2.70 cm     LV e' medial:    5.11 cm/s LV PW:         1.10 cm      LV E/e' medial:  10.2 LV IVS:        1.10 cm     LV e' lateral:   5.66 cm/s LVOT diam:     2.10 cm     LV E/e' lateral: 9.2 LV SV:         56 LV SV Index:   30 LVOT Area:     3.46 cm  LV Volumes (MOD) LV vol d,  MOD A4C: 31.1 ml LV vol s, MOD A4C: 13.1 ml LV SV MOD A4C:     31.1 ml RIGHT VENTRICLE RV Basal diam:  4.30 cm RV S prime:     9.90 cm/s TAPSE (M-mode): 1.5 cm LEFT ATRIUM           Index        RIGHT ATRIUM           Index LA diam:      2.50 cm 1.34 cm/m   RA Area:     17.90 cm LA Vol (A2C): 6.4 ml  3.46 ml/m   RA Volume:   47.60 ml  25.54 ml/m LA Vol (A4C): 21.0 ml 11.27 ml/m  AORTIC VALVE                 PULMONIC VALVE AV Area (Vmax): 3.01 cm     PV Vmax:        0.77 m/s AV Vmax:        114.00 cm/s  PV Peak grad:   2.4 mmHg AV Peak Grad:   5.2 mmHg     RVOT Peak grad: 1 mmHg LVOT Vmax:      99.00 cm/s LVOT Vmean:     68.200 cm/s LVOT VTI:       0.162 m  AORTA Ao Root diam: 3.60 cm Ao Asc diam:  3.50 cm MITRAL VALVE               TRICUSPID VALVE MV Area (PHT): 3.27 cm    TR Peak grad:   30.0 mmHg MV Decel Time: 232 msec    TR Vmax:        274.00 cm/s MV E velocity: 51.90 cm/s MV A velocity: 53.50 cm/s  SHUNTS MV E/A ratio:  0.97        Systemic VTI:  0.16 m                            Systemic Diam: 2.10 cm Alwyn Pea MD Electronically signed by Alwyn Pea MD Signature Date/Time: 01/21/2024/12:59:54 PM    Final    DG Abd 1 View Result Date: 01/21/2024 CLINICAL DATA:  [id number] Encounter for orogastric (OG) tube placement [id number] EXAM: ABDOMEN - 1 VIEW COMPARISON:  Abdominal radiograph from earlier today FINDINGS: Enteric tube tip is seen in the lower thoracic esophagus. External pacer pads overlie the left lower chest. Top-normal caliber small bowel loops in the visualized abdomen with no evidence of pneumatosis or pneumoperitoneum. Excreted contrast noted in the nondilated renal collecting systems. Similar mild patchy bibasilar lung opacities. IMPRESSION: Enteric tube tip in the lower  thoracic esophagus, recommend replacement or advancement. Per the technologist notes, the provider was present for the imaging and removed the OG tube after the radiograph. Electronically Signed   By: Delbert Phenix M.D.   On: 01/21/2024 10:25   DG Abd 1 View Result Date: 01/21/2024 CLINICAL DATA:  89 year old male central line placement. EXAM: ABDOMEN - 1 VIEW COMPARISON:  Portable chest 0639 hours reported separately. FINDINGS: AP view of the lower chest and upper abdomen at 0639 hours. Unchanged appearance of right IJ central line as described separately. As on the portable chest no enteric tube is identified. Pacer/resuscitation pads project over the medial left diaphragm. Coarse asymmetric right lung opacity again noted. Stable cardiac size and mediastinal contours. Mild gaseous distension of the stomach. Persistent bilateral nephrograms also with some contrast excretion into nondilated collecting  systems. IMPRESSION: 1. No enteric tube identified. Mild gaseous distension of the stomach. Consider enteric tube placement. 2. Right IJ central line placement as described on portable chest separately. 3. Persistent bilateral nephrograms suggesting acute or chronic renal insufficiency. 4. Abnormal right lung opacity as described separately. Electronically Signed   By: Odessa Fleming M.D.   On: 01/21/2024 07:00   DG Chest Port 1 View Result Date: 01/21/2024 CLINICAL DATA:  89 year old male central line placement. EXAM: PORTABLE CHEST 1 VIEW COMPARISON:  CTA chest abdomen and pelvis 0324 hours today. FINDINGS: Portable AP upright view at 0639 hours. Intubated now. Endotracheal tube tip in good position just below the clavicles. No enteric tube identified. New right IJ approach vascular catheter, tip in good position near the expected level of the cavoatrial junction. Stable cardiac size and mediastinal contours. Left chest generator device with electrical leads extending up into the chest again noted. Coarse, confluent,  asymmetric pulmonary opacity superimposed on emphysema with left lung base hyperinflation and/or bullous changes as seen by CT. No pneumothorax or pleural effusion identified. IMPRESSION: 1. Satisfactory endotracheal tube and right IJ approach vascular catheter placement. No pneumothorax. 2. Emphysema with extensive right lung opacity as characterized by CT this morning. Electronically Signed   By: Odessa Fleming M.D.   On: 01/21/2024 06:58   CT Angio Chest/Abd/Pel for Dissection W and/or Wo Contrast Result Date: 01/21/2024 CLINICAL DATA:  Acute aortic syndrome EXAM: CT ANGIOGRAPHY CHEST, ABDOMEN AND PELVIS TECHNIQUE: Non-contrast CT of the chest was initially obtained. Multidetector CT imaging through the chest, abdomen and pelvis was performed using the standard protocol during bolus administration of intravenous contrast. Multiplanar reconstructed images and MIPs were obtained and reviewed to evaluate the vascular anatomy. RADIATION DOSE REDUCTION: This exam was performed according to the departmental dose-optimization program which includes automated exposure control, adjustment of the mA and/or kV according to patient size and/or use of iterative reconstruction technique. CONTRAST:  OMNIPAQUE IOHEXOL 350 MG/ML SOLN COMPARISON:  None Available. FINDINGS: CTA CHEST FINDINGS Cardiovascular: No significant coronary artery calcification. Global cardiac size within normal limits. No pericardial effusion. The central pulmonary arteries are mildly enlarged suggesting changes of pulmonary arterial hypertension. No intraluminal filling defect identified through the segmental level to suggest acute pulmonary embolism. The thoracic aorta is of normal caliber. No intramural hematoma, dissection, or aneurysm. Mild atherosclerotic calcification. Arch vasculature demonstrates normal anatomic configuration and is widely patent proximally. Mediastinum/Nodes: No enlarged mediastinal, hilar, or axillary lymph nodes. Thyroid  gland, trachea, and esophagus demonstrate no significant findings. Small hiatal hernia. Lungs/Pleura: Severe emphysema. Superimposed interstitial and airspace infiltrate throughout the left upper lobe and right lung, likely infectious in the acute setting. Hyper lucency of the left lower lobe may relate to compensatory hyperinflation, asymmetric emphysema or the sequela of remote viral infection. There is no pneumothorax or pleural effusion. Marked narrowing of the mainstem bronchi, left lower lobar bronchus and bronchus intermedius in keeping with changes bronchomalacia. Musculoskeletal: No acute bone abnormality. Remote healed left humeral head fracture. Review of the MIP images confirms the above findings. CTA ABDOMEN AND PELVIS FINDINGS VASCULAR Aorta: Normal caliber aorta without aneurysm, dissection, vasculitis or significant stenosis. Moderate atherosclerotic calcification. Celiac: Patent without evidence of aneurysm, dissection, vasculitis or significant stenosis. SMA: Patent without evidence of aneurysm, dissection, vasculitis or significant stenosis. Renals: Both renal arteries are patent without evidence of aneurysm, dissection, vasculitis, fibromuscular dysplasia or significant stenosis. IMA: Patent without evidence of aneurysm, dissection, vasculitis or significant stenosis. Inflow: Patent without evidence of aneurysm,  dissection, vasculitis or significant stenosis. Veins: The renal veins and suprarenal inferior vena cava appear relatively slit-like, finding that can be seen with intravascular depletion. The venous structures are otherwise unremarkable on this arterial phase examination. Review of the MIP images confirms the above findings. NON-VASCULAR Hepatobiliary: Cholelithiasis without superimposed pericholecystic inflammatory change. Liver unremarkable; no enhancing intrahepatic mass identified. No intra or extrahepatic biliary ductal dilation. Pancreas: Unremarkable Spleen: Unremarkable  Adrenals/Urinary Tract: The adrenal glands are unremarkable. The kidneys are unremarkable save for simple cortical cysts bilaterally for which no follow-up imaging is recommended. The bladder appears mildly distend and trabeculated in keeping with changes bladder outlet obstruction. Stomach/Bowel: Stomach is within normal limits. Appendix appears normal. No evidence of bowel wall thickening, distention, or inflammatory changes. Lymphatic: No pathologic at abdomen and pelvis. Reproductive: Moderate prostatic hypertrophy. The prostate gland is partially obscured by streak artifact. Other: Small fat containing hernias. Musculoskeletal: There is a acute to subacute appearing right subcapital femoral neck fracture with impaction and external rotation of the distal fracture fragment. The femoral head is still seated right acetabulum with superimposed moderate right hip degenerative arthritis. Left hip bipolar hemiarthroplasty has been performed. Degenerative changes seen within the lumbar spin. Review of the MIP images confirms the above findings. IMPRESSION: 1. No evidence of aortic dissection or aneurysm. No evidence of acute pulmonary embolism. 2. Acute to subacute appearing right subcapital femoral neck fracture with impaction and external rotation of the distal fracture fragment. 3. Severe emphysema. 4. Extensive airspace infiltrate most in keeping with acute infection the appropriate clinical setting. 5. Marked narrowing of the mainstem bronchi, left lower lobar bronchus and bronchus intermedius in keeping with changes bronchomalacia. 6. The renal veins and suprarenal inferior vena cava appear relatively slit-like, finding that can be seen with intravascular depletion. 7. Cholelithiasis. 8. Moderate prostatic hypertrophy with associated bladder outlet obstruction. These results were called by telephone at the time of interpretation on 01/21/2024 at 4:07 am to provider [facility] , who verbally acknowledged these  results. Aortic Atherosclerosis (ICD10-I70.0) and Emphysema (ICD10-J43.9). Electronically Signed   By: Helyn Numbers M.D.   On: 01/21/2024 04:07   CT Head Wo Contrast Result Date: 01/21/2024 CLINICAL DATA:  Mental status change, unknown cause EXAM: CT HEAD WITHOUT CONTRAST TECHNIQUE: Contiguous axial images were obtained from the base of the skull through the vertex without intravenous contrast. RADIATION DOSE REDUCTION: This exam was performed according to the departmental dose-optimization program which includes automated exposure control, adjustment of the mA and/or kV according to patient size and/or use of iterative reconstruction technique. COMPARISON:  CT head 10/19/2023. FINDINGS: Brain: No evidence of acute infarction, hemorrhage, hydrocephalus, extra-axial collection or mass lesion/mass effect. Similar patchy white matter hypodensities, compatible with chronic microvascular ischemic disease. Similar position of a left deep brain stimulator lead without complicating feature. Similar cerebral atrophy. Vascular: No hyperdense vessel. Skull: No acute fracture. Sinuses/Orbits: No acute finding. Other: No mastoid effusions. IMPRESSION: 1. No evidence of acute intracranial abnormality. 2. Similar chronic microvascular ischemic disease and left deep brain stimulator. Electronically Signed   By: Feliberto Harts M.D.   On: 01/21/2024 03:43     Echo preserved left ventricular function  TELEMETRY: Sinus rhythm rate of 90:  ASSESSMENT AND PLAN:  Principal Problem:   STEMI (ST elevation myocardial infarction) (HCC) Active Problems:   Cardiac arrest with successful resuscitation [facility])   Closed fracture of right hip (HCC)   Shock (HCC)   Aspiration pneumonia of both lungs (HCC) Atrial fibrillation paroxysmal Respiratory failure  Plan Continue supportive care for respiratory failure continue vent management wean when appropriate No evidence of ischemia cardiac cath with no obstructive  disease Demand ischemia with elevated troponin related to medical presentation and event Renal insufficiency stage IV maintain adequate hydration consider nephrology input Pneumonia appears to be possible aspiration continue broad-spectrum antibiotic therapy as well as ventilatory support with inhalers supplemental oxygen Evidence of STEMI or ACS continue conservative therapy okay to continue heparin for A-fib DVT prophylaxis Closed fracture right hip consider orthopedic input Continue conservative cardiac management   Alwyn Pea, MD, 01/22/2024 4:06 PM

## 2024-01-22 NOTE — IPAL (Signed)
  Interdisciplinary Goals of Care Family Meeting   Date carried out: 01/22/2024  Location of the meeting: Bedside  Member's involved: Nurse Practitioner and Bedside Registered Nurse  Durable Power of Attorney or acting medical decision maker: Patient's wife, son and grandson    Discussion: We discussed goals of care for Carlos Richard .    Code status:   Code Status: Limited: Do not attempt resuscitation (DNR) -DNR-LIMITED -Do Not Intubate/DNI    Disposition: Continue current acute care  Time spent for the meeting: 25 minutes    [name], DNP, CCRN, FNP-C, AGACNP-BC Acute Care & Family Nurse Practitioner  [facility]  See Amion for personal pager PCCM on call pager [phone] until 7 am

## 2024-01-22 NOTE — Consult Note (Signed)
 PHARMACY - ANTICOAGULATION CONSULT NOTE  Pharmacy Consult for Heparin Indication: atrial fibrillation  No Known Allergies  Patient Measurements: Height: 5' 9.02" (175.3 cm) Weight: 71.2 kg (156 lb 15.5 oz) IBW/kg (Calculated) : 70.74 Heparin Dosing Weight: 71.2 kg   Vital Signs: Temp: 99.7 F (37.6 C) (03/08 2330) Temp Source: Axillary (03/08 2330) BP: 121/69 (03/09 0115) Pulse Rate: 71 (03/09 0115)  Labs: Recent Labs    01/21/24 0254 01/21/24 0350 01/21/24 0609 01/21/24 0904 01/21/24 1258 01/21/24 1658 01/21/24 2347  HGB 13.3  --   --   --  13.2  --   --   HCT 41.9  --   --   --  39.3  --   --   PLT 215  --   --   --  196  --   --   APTT  --   --  124*  --   --   --  >200*  LABPROT  --   --  23.0*  --   --   --   --   INR  --   --  2.0*  --   --   --   --   HEPARINUNFRC  --   --   --   --   --  >1.10* >1.10*  CREATININE  --  1.47*  --   --  1.76*  --   --   CKMB  --   --   --  19.0*  --   --   --   TROPONINIHS 16  --  482*  --  1,118*  --   --    Estimated Creatinine Clearance: 28.5 mL/min (A) (by C-G formula based on SCr of 1.76 mg/dL (H)).  Medical History: Past Medical History:  Diagnosis Date   Atrial fibrillation (HCC)    COPD (chronic obstructive pulmonary disease) (HCC)    Hypertension    Near syncope    Tremors of nervous system    Medications:  PTA Xarelto - last dose taken yesterday afternoon per patient's wife   Assessment: Carlos Richard is an 89 year old male that presented today as a code STEMI. Elevated troponins of 482 > 1118. From chart review, patient takes Xarelto 15 mg daily at home. Confirmed with patient's wife that last dose was taken yesterday afternoon. Pharmacy has been consulted for initiation and management of a heparin infusion. Baseline labs: Hgb 13.2, PLT 196, aPTT 124, INR 2.0, HL has been ordered.   Goal of Therapy:  Heparin level 0.3-0.7 units/ml Monitor platelets by anticoagulation protocol: Yes  03/09 2347 aPTT > 200,  HL > 1.1, supratherapeutic   Plan:  Insurance claims handler.  Hold infusion for 1 hour. Restart heparin infusion at 850 units/hr Recheck aPTT level 8 hours after restart  Will monitor aPTT levels until correlation with HL  Monitor HL and CBC daily   Otelia Sergeant, PharmD, [facility] 01/22/2024 1:27 AM

## 2024-01-22 NOTE — Progress Notes (Signed)
 NAME:  Carlos Richard, MRN:  [id number], DOB:  11/19/1931, LOS: 1 ADMISSION DATE:  01/21/2024,  CHIEF COMPLAINT:  Respiratory Failure, Cardiac Arrest   History of Present Illness:   89 y.o male with significant PMH of atrial fibrillation with slow ventricular response, B12 deficiency, HTN, HLD, COPD, essential tremors (ET), left thalamic DBS stimulator for ET who presented to the ED as a code STEMI.   Per ED reports, patient was sleeping when he woke up screaming.  Patient's wife found him with agonal respiration and started CPR although unclear whether he had a pulse or not.  EMS was called and on their arrival patient was found to be hypotensive, altered with agonal respirations.  Levophed was started with slight improvement in his blood pressure.  Initial EKG concerning for ST elevation in V1 through V3 therefore code STEMI called en route to the ED.   ED Course: Initial vital signs showed HR of beats/minute, BP mm Hg, the RR 30 breaths/minute, and the oxygen saturation % on and a temperature of 98.50F (36.9C).  He was awake somnolent but able to answer questions with only complaint of back pain.  Patient was discussed with STEMI on call who recommended activating Cath Lab.  Due to concern for possible dissection, patient was taken to CT for further evaluation.  Upon return from CT patient had a witnessed generalized tonic-clonic seizure activity.  He was brought back to the ED immediately where he decompensated.  Patient dropped his blood pressure, became bradycardic and then went into asystole.  CPR/ACLS initiated for approximately 15 minutes prior to ROSC achieved.  Patient intubated in the ED.  He was started on Levophed and propofol and taken emergently to Cath Lab.   Pertinent Labs/Diagnostics Findings: Na+/ K+: 139/3.6  Glucose: 296 BUN/Cr.:13/1.47, CO2 14, anion gap 23 WBC: K/L without bands or neutrophil predominance   troponin:   CXR> CTH> CTA Chest> CT Abd/pelvis> Medication  administered in the ED: Disposition: Patient was transferred to the ICU post cardiac cath intubated and sedated.  PCCM consulted  Pertinent  Medical History  -afib -copd -HTN -essential tremor  Significant Hospital Events: Including procedures, antibiotic start and stop dates in addition to other pertinent events   3/8: intubated s/p PEA arrest, admit to ICU, LHC negative 3/9: remains intubated, passed SBT, but with depressed mental status and increased secretions  Interim History / Subjective:  Off sedation, arouses to verbal stimuli  Objective   Blood pressure 98/61, pulse 81, temperature 100.3 F (37.9 C), temperature source Axillary, resp. rate (!) 22, height 5' 9.02" (1.753 m), weight 75.8 kg, SpO2 92%.    Vent Mode: PSV FiO2 (%):  [40 %-70 %] 40 % Set Rate:  [18 bmp-22 bmp] 18 bmp Vt Set:  [400 mL] 400 mL PEEP:  [5 cmH20-10 cmH20] 5 cmH20 Pressure Support:  [5 cmH20] 5 cmH20 Plateau Pressure:  [15 cmH20-18 cmH20] 15 cmH20   Intake/Output Summary (Last 24 hours) at 01/22/2024 1432 Last data filed at 01/22/2024 1425 Gross per 24 hour  Intake 1969.71 ml  Output 415 ml  Net 1554.71 ml   Filed Weights   01/21/24 0615 01/21/24 1500 01/22/24 0500  Weight: 71.2 kg 71.2 kg 75.8 kg    Examination: Physical Exam Constitutional:      General: He is not in acute distress.    Appearance: He is ill-appearing.  Cardiovascular:     Rate and Rhythm: Normal rate and regular rhythm.     Pulses: Normal pulses.  Heart sounds: Normal heart sounds.  Pulmonary:     Breath sounds: Normal breath sounds.     Comments: Ventilated breath sounds bilaterally Abdominal:     Palpations: Abdomen is soft.  Neurological:     Mental Status: He is disoriented.     Comments: Opens eyes to verbal stimuli, follows simple commands      Assessment & Plan:   89 year old male with history of atrial fibrillation (? Slow ventricular response), HTN, and recurrent falls presenting to the hospital  with AMS with subsequent development of a cardiac arrest in the hospital. He was intubated and is now admitted to the ICU for further management.   #Acute Hypoxic Respiratory Failure #Cardiac Arrest #Distributive Shock #Right Femoral Neck Fracture #Atrial Fibrillation   Neuro - sedated with propofol and fentanyl, localizes and withdraws to pain after cardiac arrest. Off sedation for WUA today. Will resort to PRN boluses as needed for analgesia. CV - distributive shock of unclear source. No CAD on LHC, and no PE on CTA. Pulmonary findings likely represent aspiration rather than pneumonia, and I don't suspect that's resulted in shock. It is possible that severe acidemia (from lactic acidosis) resulted in distributive shock. Other differentials include obstructive shock due to fat embolus (unlikely as he's on rivaroxaban) or septic shock of unclear source. Appreciate input and help from cardiology. Currently on nor-epinephrine and vasopressin, with goal MAP of > 65 mmHg. Lactic acid is cleared, and his vasopressor requirements are notably improved. Coox doesn't suggest cardiogenic nor distributive shock. Pulm - respiratory failure in the setting of cardiac arrest. Chest CT shows signs of frank aspiration. Attempt at SBT this AM was successful, but patient remains with significant secretion burden and his saturations are maintained with 40% FiO2. Will continue with SBT today (PSV to decrease sedation needs) and assess tomorrow for extubation. GI - NPO for now, PPI for SUP Renal - Kidney function worsened after cardiac arrest and in the setting of shock. Acid base status improved. Holding off on further fluids in the setting of dilated RV. Endo - ICU glycemic protocol Hem/Onc - continue heparin gtt given history of Afib ID - On broad spectrum antibiotics for coverage of aspiration pneumonia. Other - family updated at the bedside. He is DNR/DNI  Best Practice (right click and "Reselect all SmartList  Selections" daily)   Diet/type: tubefeeds DVT prophylaxis systemic heparin Pressure ulcer(s): N/A GI prophylaxis: PPI Lines: Central line Foley:  Yes, and it is still needed Code Status:  DNR Last date of multidisciplinary goals of care discussion [01/22/2024]  Labs   CBC: Recent Labs  Lab 01/21/24 0254 01/21/24 1258 01/22/24 0403  WBC 14.9* 24.7* 22.9*  HGB 13.3 13.2 13.2  HCT 41.9 39.3 39.2  MCV 95.0 92.3 92.9  PLT 215 196 188    Basic Metabolic Panel: Recent Labs  Lab 01/21/24 0350 01/21/24 1258 01/22/24 0403  NA 139 139 141  K 3.6 3.6 4.4  CL 102 109 107  CO2 14* 21* 21*  GLUCOSE 296* 195* 173*  BUN 13 16 24*  CREATININE 1.47* 1.76* 2.39*  CALCIUM 11.2* 8.2* 8.3*  MG 2.9* 2.6* 2.8*  PHOS  --  3.6 5.8*   GFR: Estimated Creatinine Clearance: 21 mL/min (A) (by C-G formula based on SCr of 2.39 mg/dL (H)). Recent Labs  Lab 01/21/24 0254 01/21/24 0609 01/21/24 0622 01/21/24 1257 01/21/24 1258 01/21/24 1658 01/22/24 0403  PROCALCITON  --   --  <0.10  --   --   --   --  WBC 14.9*  --   --   --  24.7*  --  22.9*  LATICACIDVEN  --  6.3* 6.5* 3.1*  --  2.6*  --     Liver Function Tests: Recent Labs  Lab 01/21/24 0350 01/21/24 1258  AST 105* 146*  ALT 104* 118*  ALKPHOS 57 57  BILITOT 0.4 1.1  PROT 5.4* 5.3*  ALBUMIN 2.9* 2.9*   No results for input(s): "LIPASE", "AMYLASE" in the last 168 hours. No results for input(s): "AMMONIA" in the last 168 hours.  ABG    Component Value Date/Time   PHART 7.36 01/22/2024 0404   PCO2ART 40 01/22/2024 0404   PO2ART 119 (H) 01/22/2024 0404   HCO3 22.6 01/22/2024 0404   ACIDBASEDEF 2.7 (H) 01/22/2024 0404   O2SAT 99.9 01/22/2024 0404     Coagulation Profile: Recent Labs  Lab 01/21/24 0609  INR 2.0*    Cardiac Enzymes: Recent Labs  Lab 01/21/24 0904  CKMB 19.0*    HbA1C: HB A1C (BAYER DCA - WAIVED)  Date/Time Value Ref Range Status  12/07/2019 02:21 PM 5.7 <7.0 % Final    Comment:                                           Diabetic Adult            <7.0                                       Healthy Adult        4.3 - 5.7                                                           (DCCT/NGSP) American Diabetes Association's Summary of Glycemic Recommendations for Adults with Diabetes: Hemoglobin A1c <7.0%. More stringent glycemic goals (A1c <6.0%) may further reduce complications at the cost of increased risk of hypoglycemia.    Hgb A1c MFr Bld  Date/Time Value Ref Range Status  01/21/2024 06:09 AM 5.7 (H) 4.8 - 5.6 % Final    Comment:    (NOTE) Pre diabetes:          5.7%-6.4%  Diabetes:              >6.4%  Glycemic control for   <7.0% adults with diabetes     CBG: Recent Labs  Lab 01/21/24 2022 01/21/24 2359 01/22/24 0425 01/22/24 0801 01/22/24 1113  GLUCAP 161* 160* 155* 150* 175*    Review of Systems:   N/A  Past Medical History:  He,  has a past medical history of Atrial fibrillation (HCC), COPD (chronic obstructive pulmonary disease) (HCC), Hypertension, Near syncope, and Tremors of nervous system.   Surgical History:   Past Surgical History:  Procedure Laterality Date   BACK SURGERY     BRAIN SURGERY     stimulator placement for tremors   EYE SURGERY Left 2020   cataract surgery nov    EYE SURGERY Right 2020   cataract surgery dec     Social History:   reports that he quit smoking about 45 years ago. His smoking  use included cigarettes. He has never used smokeless tobacco. He reports current alcohol use. He reports that he does not use drugs.   Family History:  His family history includes Obesity in his sister; Tremor in his paternal grandmother; Tuberculosis in his mother. There is no history of Prostate cancer, Bladder Cancer, or Kidney cancer.   Allergies No Known Allergies   Home Medications  Prior to Admission medications   Medication Sig Start Date End Date Taking? Authorizing Provider  allopurinol (ZYLOPRIM) 100 MG tablet Take 1  tablet (100 mg total) by mouth daily. Monday, Wednesday, Friday 07/01/23   Olevia Perches P, DO  amLODipine (NORVASC) 5 MG tablet Take 1 tablet (5 mg total) by mouth daily. 09/30/23 09/29/24  Laurier Nancy, MD  fluticasone-salmeterol (ADVAIR) 250-50 MCG/ACT AEPB INHALE 1 PUFF INTO THE LUNGS TWICE DAILY 12/23/23   Olevia Perches P, DO  furosemide (LASIX) 20 MG tablet TAKE 1 TABLET DAILY. 09/01/23   Laurier Nancy, MD  losartan (COZAAR) 100 MG tablet Take 1 tablet (100 mg total) by mouth daily. 11/21/23   Johnson, Megan P, DO  magnesium oxide (MAG-OX) 400 MG tablet Take 400 mg by mouth daily.    [provider]  memantine (NAMENDA) 5 MG tablet Take 5mg  once daily for one week, then increase to 5mg  twice daily for memory 10/26/23   [provider]  metoprolol succinate (TOPROL-XL) 50 MG 24 hr tablet Take 1 tablet (50 mg total) by mouth daily. 11/18/23   Johnson, Megan P, DO  metoprolol tartrate (LOPRESSOR) 50 MG tablet Take by mouth.    [provider]  Multiple Vitamins-Minerals (ZINC PO) Take 1 tablet by mouth daily.    [provider]  simvastatin (ZOCOR) 20 MG tablet Take 1 tablet (20 mg total) by mouth daily. 10/27/23   Johnson, Megan P, DO  triamcinolone cream (KENALOG) 0.1 % Apply 1 Application topically 2 (two) times daily. 10/31/23   Johnson, Megan P, DO  XARELTO 15 MG TABS tablet Take 1 tablet (15 mg total) by mouth daily with supper. 12/20/23   Laurier Nancy, MD  Zinc 50 MG TABS Take 50 mg by mouth daily.     [provider]     Critical care time: 45 minutes    Raechel Chute, MD [facility] 01/22/2024 2:43 PM

## 2024-01-22 NOTE — Plan of Care (Signed)
  Problem: Clinical Measurements: Goal: Will remain free from infection Outcome: Progressing Goal: Diagnostic test results will improve Outcome: Progressing Goal: Respiratory complications will improve Outcome: Progressing   Problem: Skin Integrity: Goal: Risk for impaired skin integrity will decrease Outcome: Progressing   Problem: Tissue Perfusion: Goal: Adequacy of tissue perfusion will improve Outcome: Progressing

## 2024-01-22 NOTE — Consult Note (Signed)
 PHARMACY - ANTICOAGULATION CONSULT NOTE  Pharmacy Consult for Heparin Indication: atrial fibrillation  No Known Allergies  Patient Measurements: Height: 5' 9.02" (175.3 cm) Weight: 75.8 kg (167 lb 1.7 oz) IBW/kg (Calculated) : 70.74 Heparin Dosing Weight: 71.2 kg   Vital Signs: Temp: 100.3 F (37.9 C) (03/09 1115) Temp Source: Axillary (03/09 1115) BP: 108/56 (03/09 1200) Pulse Rate: 77 (03/09 1200)  Labs: Recent Labs    01/21/24 0254 01/21/24 0350 01/21/24 0609 01/21/24 0904 01/21/24 1258 01/21/24 1658 01/21/24 2347 01/22/24 0403 01/22/24 1039  HGB 13.3  --   --   --  13.2  --   --  13.2  --   HCT 41.9  --   --   --  39.3  --   --  39.2  --   PLT 215  --   --   --  196  --   --  188  --   APTT  --   --  124*  --   --   --  >200*  --  199*  LABPROT  --   --  23.0*  --   --   --   --   --   --   INR  --   --  2.0*  --   --   --   --   --   --   HEPARINUNFRC  --   --   --   --   --  >1.10* >1.10*  --   --   CREATININE  --  1.47*  --   --  1.76*  --   --  2.39*  --   CKMB  --   --   --  19.0*  --   --   --   --   --   TROPONINIHS 16  --  482*  --  1,118*  --   --   --   --    Estimated Creatinine Clearance: 21 mL/min (A) (by C-G formula based on SCr of 2.39 mg/dL (H)).  Medical History: Past Medical History:  Diagnosis Date   Atrial fibrillation (HCC)    COPD (chronic obstructive pulmonary disease) (HCC)    Hypertension    Near syncope    Tremors of nervous system    Medications:  PTA Xarelto - last dose taken yesterday afternoon per patient's wife   Assessment: Myreon Wimer is an 89 year old male that presented today as a code STEMI. Elevated troponins of 482 > 1118. From chart review, patient takes Xarelto 15 mg daily at home. Confirmed with patient's wife that last dose was taken yesterday afternoon. Pharmacy has been consulted for initiation and management of a heparin infusion. Baseline labs: Hgb 13.2, PLT 196, aPTT 124, INR 2.0, HL has been ordered.    Goal of Therapy:  Heparin level 0.3-0.7 units/ml Monitor platelets by anticoagulation protocol: Yes  03/09 2347 aPTT > 200, HL > 1.1, supratherapeutic 03/09 1039 aPTT 199, supratherapeutic   Plan:  Insurance claims handler.  Hold infusion for 1 hour. Restart heparin infusion at 650 units/hr Recheck aPTT level 8 hours after restart  Will monitor aPTT levels until correlation with HL  Monitor HL and CBC daily   Bettey Costa, PharmD Clinical Pharmacist 01/22/2024 12:31 PM

## 2024-01-22 NOTE — Consult Note (Signed)
 PHARMACY - ANTICOAGULATION CONSULT NOTE  Pharmacy Consult for Heparin Infusion Indication: atrial fibrillation  No Known Allergies  Patient Measurements: Height: 5' 9.02" (175.3 cm) Weight: 75.8 kg (167 lb 1.7 oz) IBW/kg (Calculated) : 70.74 Heparin Dosing Weight: 71.2 kg   Vital Signs: Temp: 100.2 F (37.9 C) (03/09 2000) Temp Source: Axillary (03/09 2000) BP: 114/65 (03/09 2000) Pulse Rate: 71 (03/09 2000)  Labs: Recent Labs    01/21/24 0254 01/21/24 0254 01/21/24 0350 01/21/24 0609 01/21/24 0904 01/21/24 1258 01/21/24 1658 01/21/24 2347 01/22/24 0403 01/22/24 1039 01/22/24 2041  HGB 13.3  --   --   --   --  13.2  --   --  13.2  --   --   HCT 41.9  --   --   --   --  39.3  --   --  39.2  --   --   PLT 215  --   --   --   --  196  --   --  188  --   --   APTT  --    < >  --  124*  --   --   --  >200*  --  199* 105*  LABPROT  --   --   --  23.0*  --   --   --   --   --   --   --   INR  --   --   --  2.0*  --   --   --   --   --   --   --   HEPARINUNFRC  --   --   --   --   --   --  >1.10* >1.10*  --   --   --   CREATININE  --   --  1.47*  --   --  1.76*  --   --  2.39*  --   --   CKMB  --   --   --   --  19.0*  --   --   --   --   --   --   TROPONINIHS 16  --   --  482*  --  1,118*  --   --   --   --   --    < > = values in this interval not displayed.   Estimated Creatinine Clearance: 21 mL/min (A) (by C-G formula based on SCr of 2.39 mg/dL (H)).  Medical History: Past Medical History:  Diagnosis Date   Atrial fibrillation (HCC)    COPD (chronic obstructive pulmonary disease) (HCC)    Hypertension    Near syncope    Tremors of nervous system    Assessment: Carlos Richard is a 89 y.o. male presenting with STEMI. PMH significant for essential tremor, BPH, COPD, AF, HLD, CKD, HTN. Patient was on [facility] PTA per chart review. From chart review, patient takes Xarelto 15 mg daily at home. Per patient's wife, last dose was taken 3/7 afternoon.  Pharmacy has been  consulted to initiate and manage heparin infusion.   Baseline Labs: aPTT 124, PT 23.0, INR 2.0, Hgb 13.3, Hct 41.9, Plt 215   Goal of Therapy:  Heparin level 0.3-0.7 units/ml aPTT 66-102 seconds Monitor platelets by anticoagulation protocol: Yes  Date Time aPTT/HL Rate/Comment  3/9 2347 >200/>1.10 12050/supratherapeutic 3/9 1039 199/---  850/supratherapeutic 3/9 2041 105/---  650/supratherapeutic   Plan:  Decrease heparin infusion to 550 units/hr Check aPTT  in 8 hours Continue to monitor aPTT until HL and aPTT correlate.  Check HL daily for correlation until HL and aPTT correlate. Switch to HL monitoring once HL and aPTT correlate.  Continue to monitor H&H and platelets daily while on heparin infusion    Celene Squibb, PharmD Clinical Pharmacist 01/22/2024 9:27 PM

## 2024-01-22 NOTE — Consult Note (Signed)
 PHARMACY CONSULT NOTE - ELECTROLYTES  Pharmacy Consult for Electrolyte Monitoring and Replacement   Recent Labs: Height: 5' 9.02" (175.3 cm) Weight: 75.8 kg (167 lb 1.7 oz) IBW/kg (Calculated) : 70.74 Estimated Creatinine Clearance: 21 mL/min (A) (by C-G formula based on SCr of 2.39 mg/dL (H)). Potassium (mmol/L)  Date Value  01/22/2024 4.4  11/13/2013 3.6   Magnesium (mg/dL)  Date Value  16/08/9603 2.8 (H)   Calcium (mg/dL)  Date Value  54/07/8118 8.3 (L)   Calcium, Total (mg/dL)  Date Value  14/78/2956 8.8   Albumin (g/dL)  Date Value  21/30/8657 2.9 (L)  09/20/2023 4.2  11/13/2013 3.8   Phosphorus (mg/dL)  Date Value  84/69/6295 5.8 (H)   Sodium (mmol/L)  Date Value  01/22/2024 141  10/31/2023 145 (H)  11/13/2013 141    Assessment  Carlos Richard is a 89 y.o. male presenting with chest pain. PMH significant for atrial fibrillation with slow ventricular response, B12 deficiency, HTN, HLD, COPD, essential tremors (ET), left thalamic DBS stimulator. Pharmacy has been consulted to monitor and replace electrolytes.  Diet: heart-healthy MIVF: NS @ 100 mL/hr  Goal of Therapy: Electrolytes WNL  Plan:  No replacement indicated today Check BMP, Mg, Phos with AM labs  Thank you for allowing pharmacy to be a part of this patient's care.  Bettey Costa, PharmD Clinical Pharmacist 01/22/2024 8:02 AM

## 2024-01-22 NOTE — Plan of Care (Signed)
  Problem: Clinical Measurements: Goal: Respiratory complications will improve Outcome: Progressing   Problem: Coping: Goal: Level of anxiety will decrease Outcome: Progressing   Problem: Elimination: Goal: Will not experience complications related to urinary retention Outcome: Progressing   Problem: Pain Managment: Goal: General experience of comfort will improve and/or be controlled Outcome: Progressing   Problem: Safety: Goal: Ability to remain free from injury will improve Outcome: Progressing   Problem: Skin Integrity: Goal: Risk for impaired skin integrity will decrease Outcome: Progressing   Problem: Tissue Perfusion: Goal: Adequacy of tissue perfusion will improve Outcome: Progressing   Problem: Cardiovascular: Goal: Ability to achieve and maintain adequate cardiovascular perfusion will improve Outcome: Progressing Goal: Vascular access site(s) Level 0-1 will be maintained Outcome: Progressing

## 2024-01-23 ENCOUNTER — Encounter: Payer: Self-pay | Admitting: Internal Medicine

## 2024-01-23 ENCOUNTER — Inpatient Hospital Stay

## 2024-01-23 DIAGNOSIS — A419 Sepsis, unspecified organism: Secondary | ICD-10-CM

## 2024-01-23 DIAGNOSIS — R569 Unspecified convulsions: Secondary | ICD-10-CM | POA: Diagnosis not present

## 2024-01-23 DIAGNOSIS — G9341 Metabolic encephalopathy: Secondary | ICD-10-CM

## 2024-01-23 DIAGNOSIS — E440 Moderate protein-calorie malnutrition: Secondary | ICD-10-CM | POA: Insufficient documentation

## 2024-01-23 DIAGNOSIS — R6521 Severe sepsis with septic shock: Secondary | ICD-10-CM | POA: Diagnosis not present

## 2024-01-23 DIAGNOSIS — S72001A Fracture of unspecified part of neck of right femur, initial encounter for closed fracture: Secondary | ICD-10-CM | POA: Diagnosis not present

## 2024-01-23 DIAGNOSIS — N179 Acute kidney failure, unspecified: Secondary | ICD-10-CM

## 2024-01-23 DIAGNOSIS — B379 Candidiasis, unspecified: Secondary | ICD-10-CM

## 2024-01-23 LAB — PHOSPHORUS
Phosphorus: 3.2 mg/dL (ref 2.5–4.6)
Phosphorus: 3.4 mg/dL (ref 2.5–4.6)

## 2024-01-23 LAB — CBC
HCT: 31.5 % — ABNORMAL LOW (ref 39.0–52.0)
Hemoglobin: 10.2 g/dL — ABNORMAL LOW (ref 13.0–17.0)
MCH: 30.9 pg (ref 26.0–34.0)
MCHC: 32.4 g/dL (ref 30.0–36.0)
MCV: 95.5 fL (ref 80.0–100.0)
Platelets: 104 10*3/uL — ABNORMAL LOW (ref 150–400)
RBC: 3.3 MIL/uL — ABNORMAL LOW (ref 4.22–5.81)
RDW: 13.9 % (ref 11.5–15.5)
WBC: 11.5 10*3/uL — ABNORMAL HIGH (ref 4.0–10.5)
nRBC: 0 % (ref 0.0–0.2)

## 2024-01-23 LAB — BLOOD CULTURE ID PANEL (REFLEXED) - BCID2

## 2024-01-23 LAB — HEPARIN LEVEL (UNFRACTIONATED): Heparin Unfractionated: 0.44 [IU]/mL (ref 0.30–0.70)

## 2024-01-23 LAB — LACTIC ACID, PLASMA: Lactic Acid, Venous: 2.1 mmol/L (ref 0.5–1.9)

## 2024-01-23 LAB — BASIC METABOLIC PANEL
Anion gap: 7 (ref 5–15)
BUN: 27 mg/dL — ABNORMAL HIGH (ref 8–23)
CO2: 26 mmol/L (ref 22–32)
Calcium: 7.7 mg/dL — ABNORMAL LOW (ref 8.9–10.3)
Chloride: 107 mmol/L (ref 98–111)
Creatinine, Ser: 1.8 mg/dL — ABNORMAL HIGH (ref 0.61–1.24)
GFR, Estimated: 36 mL/min — ABNORMAL LOW (ref >60)
Glucose, Bld: 173 mg/dL — ABNORMAL HIGH (ref 70–99)
Potassium: 3.6 mmol/L (ref 3.5–5.1)
Sodium: 140 mmol/L (ref 135–145)

## 2024-01-23 LAB — MAGNESIUM
Magnesium: 2.5 mg/dL — ABNORMAL HIGH (ref 1.7–2.4)
Magnesium: 2.5 mg/dL — ABNORMAL HIGH (ref 1.7–2.4)

## 2024-01-23 LAB — HEMOGLOBIN AND HEMATOCRIT, BLOOD
HCT: 32.3 % — ABNORMAL LOW (ref 39.0–52.0)
Hemoglobin: 10.6 g/dL — ABNORMAL LOW (ref 13.0–17.0)

## 2024-01-23 LAB — GLUCOSE, CAPILLARY
Glucose-Capillary: 192 mg/dL — ABNORMAL HIGH (ref 70–99)
Glucose-Capillary: 96 mg/dL (ref 70–99)
Glucose-Capillary: 121 mg/dL — ABNORMAL HIGH (ref 70–99)
Glucose-Capillary: 152 mg/dL — ABNORMAL HIGH (ref 70–99)
Glucose-Capillary: 121 mg/dL — ABNORMAL HIGH (ref 70–99)
Glucose-Capillary: 145 mg/dL — ABNORMAL HIGH (ref 70–99)

## 2024-01-23 LAB — TROPONIN I (HIGH SENSITIVITY): Troponin I (High Sensitivity): 540 ng/L (ref <18)

## 2024-01-23 LAB — COOXEMETRY PANEL
Carboxyhemoglobin: 1.6 % — ABNORMAL HIGH (ref 0.5–1.5)
Methemoglobin: <0.7 % (ref 0.0–1.5)
O2 Saturation: 78.1 %
Total hemoglobin: 11.5 g/dL — ABNORMAL LOW (ref 12.0–16.0)
Total oxygen content: 76.5 %

## 2024-01-23 LAB — APTT
aPTT: 66 s — ABNORMAL HIGH (ref 24–36)
aPTT: 49 s — ABNORMAL HIGH (ref 24–36)
aPTT: 76 s — ABNORMAL HIGH (ref 24–36)

## 2024-01-23 MED ORDER — SODIUM CHLORIDE 0.9 % IV SOLN
100.0000 mg | INTRAVENOUS | Status: DC
  Filled 2024-01-23: qty 5

## 2024-01-23 MED ORDER — AMIODARONE IV BOLUS ONLY 150 MG/100ML
150.0000 mg | Freq: Once | INTRAVENOUS | Status: AC
  Administered 2024-01-23: 150 mg via INTRAVENOUS
  Filled 2024-01-23: qty 100

## 2024-01-23 MED ORDER — LACTATED RINGERS IV BOLUS
500.0000 mL | Freq: Once | INTRAVENOUS | Status: AC
  Administered 2024-01-23: 500 mL via INTRAVENOUS

## 2024-01-23 MED ORDER — MIDAZOLAM HCL 2 MG/2ML IJ SOLN
2.0000 mg | INTRAMUSCULAR | Status: DC | PRN
  Administered 2024-01-24: 2 mg via INTRAVENOUS
  Filled 2024-01-23: qty 2

## 2024-01-23 MED ORDER — ADULT MULTIVITAMIN W/MINERALS CH
1.0000 | ORAL_TABLET | Freq: Every day | ORAL | Status: DC
  Administered 2024-01-23 – 2024-01-25 (×3): 1
  Filled 2024-01-23 (×3): qty 1

## 2024-01-23 MED ORDER — FENTANYL CITRATE (PF) 100 MCG/2ML IJ SOLN
100.0000 ug | Freq: Once | INTRAMUSCULAR | Status: AC
  Administered 2024-01-23: 100 ug via INTRAVENOUS
  Filled 2024-01-23: qty 2

## 2024-01-23 MED ORDER — AMIODARONE IV BOLUS ONLY 150 MG/100ML
INTRAVENOUS | Status: AC
  Filled 2024-01-23: qty 100

## 2024-01-23 MED ORDER — MIDAZOLAM HCL 2 MG/2ML IJ SOLN
INTRAMUSCULAR | Status: AC
Start: 2024-01-23 — End: 2024-01-23
  Filled 2024-01-23: qty 2

## 2024-01-23 MED ORDER — PROSOURCE TF20 ENFIT COMPATIBL EN LIQD
60.0000 mL | Freq: Every day | ENTERAL | Status: DC
Start: 2024-01-23 — End: 2024-01-23

## 2024-01-23 MED ORDER — MIDAZOLAM HCL 2 MG/2ML IJ SOLN
2.0000 mg | INTRAMUSCULAR | Status: DC | PRN
  Administered 2024-01-23 (×2): 2 mg via INTRAVENOUS
  Filled 2024-01-23: qty 2

## 2024-01-23 MED ORDER — POTASSIUM CHLORIDE 10 MEQ/100ML IV SOLN
10.0000 meq | INTRAVENOUS | Status: AC
Start: 2024-01-23 — End: 2024-01-23
  Administered 2024-01-23 (×2): 10 meq via INTRAVENOUS
  Filled 2024-01-23 (×2): qty 100

## 2024-01-23 MED ORDER — AMIODARONE HCL IN DEXTROSE 360-4.14 MG/200ML-% IV SOLN
60.0000 mg/h | INTRAVENOUS | Status: AC
  Administered 2024-01-23: 60 mg/h via INTRAVENOUS
  Filled 2024-01-23 (×2): qty 200

## 2024-01-23 MED ORDER — VITAL HIGH PROTEIN PO LIQD: 1000.0000 mL | ORAL | Status: DC

## 2024-01-23 MED ORDER — AMIODARONE HCL IN DEXTROSE 360-4.14 MG/200ML-% IV SOLN
30.0000 mg/h | INTRAVENOUS | Status: DC
  Administered 2024-01-23 – 2024-01-25 (×4): 30 mg/h via INTRAVENOUS
  Filled 2024-01-23 (×3): qty 200

## 2024-01-23 MED ORDER — THIAMINE MONONITRATE 100 MG PO TABS
100.0000 mg | ORAL_TABLET | Freq: Every day | ORAL | Status: DC
  Administered 2024-01-23 – 2024-01-26 (×4): 100 mg
  Filled 2024-01-23 (×4): qty 1

## 2024-01-23 MED ORDER — HEPARIN BOLUS VIA INFUSION
2000.0000 [IU] | Freq: Once | INTRAVENOUS | Status: AC
  Administered 2024-01-23: 2000 [IU] via INTRAVENOUS
  Filled 2024-01-23: qty 2000

## 2024-01-23 MED ORDER — ACETAMINOPHEN 325 MG PO TABS
650.0000 mg | ORAL_TABLET | Freq: Four times a day (QID) | ORAL | Status: DC
  Administered 2024-01-23 – 2024-01-26 (×12): 650 mg
  Filled 2024-01-23 (×12): qty 2

## 2024-01-23 MED ORDER — VITAL AF 1.2 CAL PO LIQD
1000.0000 mL | ORAL | Status: DC
  Administered 2024-01-23 – 2024-01-25 (×3): 1000 mL

## 2024-01-23 MED ORDER — FREE WATER
30.0000 mL | Status: DC
  Administered 2024-01-23 – 2024-01-26 (×17): 30 mL

## 2024-01-23 NOTE — Consult Note (Signed)
 [facility]       Reason for Consult:positive blood culture    Referring Physician: CHAMP autoconsult  Principal Problem:   STEMI (ST elevation myocardial infarction) (HCC) Active Problems:   Cardiac arrest (HCC)   Closed fracture of right hip (HCC)   Shock (HCC)   Aspiration pneumonia of both lungs (HCC)    acetaminophen  650 mg Per Tube Q6H   Chlorhexidine Gluconate Cloth  6 each Topical Daily   fentaNYL (SUBLIMAZE) injection  25 mcg Intravenous Once   insulin aspart  0-15 Units Subcutaneous Q4H   mouth rinse  15 mL Mouth Rinse Q2H   pantoprazole (PROTONIX) IV  40 mg Intravenous QHS   sodium chloride flush  10-40 mL Intracatheter Q12H    Recommendations: Micafungin pending ID   Assessment: He is in shock with yeast in his blood  HPI: Carlos Richard is a 89 y.o. male came in after being found down with shock on pressor support and now with a positive blood culture set with yeast, BCID pending.     Past Medical History:  Diagnosis Date   Atrial fibrillation (HCC)    COPD (chronic obstructive pulmonary disease) (HCC)    Hypertension    Near syncope    Tremors of nervous system     Social History   Tobacco Use   Smoking status: Former    Current packs/day: 0.00    Types: Cigarettes    Quit date: 1980    Years since quitting: 45.2   Smokeless tobacco: Never   Tobacco comments:    quit 40 years ago   Vaping Use   Vaping status: Never Used  Substance Use Topics   Alcohol use: Yes    Comment: occasionally   Drug use: No    Family History  Problem Relation Age of Onset   Tuberculosis Mother    Tremor Paternal Grandmother    Obesity Sister    Prostate cancer Neg Hx    Bladder Cancer Neg Hx    Kidney cancer Neg Hx     No Known Allergies    Lab Results  Component Value Date   WBC 11.5 (H) 01/23/2024   HGB 10.2 (L) 01/23/2024   HCT 31.5 (L) 01/23/2024   MCV 95.5 01/23/2024   PLT 104 (L) 01/23/2024    Lab  Results  Component Value Date   CREATININE 1.80 (H) 01/23/2024   BUN 27 (H) 01/23/2024   NA 140 01/23/2024   K 3.6 01/23/2024   CL 107 01/23/2024   CO2 26 01/23/2024    Lab Results  Component Value Date   ALT 118 (H) 01/21/2024   AST 146 (H) 01/21/2024   ALKPHOS 57 01/21/2024     Microbiology: Recent Results (from the past 240 hours)  Culture, blood (Routine X 2) w Reflex to ID Panel     Status: None (Preliminary result)   Collection Time: 01/21/24  6:09 AM   Specimen: BLOOD LEFT ARM  Result Value Ref Range Status   Specimen Description   Final    BLOOD LEFT ARM patient came from cath lab, critical condition IV pumps could not be paused   Special Requests   Final    BOTTLES DRAWN AEROBIC AND ANAEROBIC Blood Culture results may not be optimal due to an inadequate volume of blood received in culture bottles   Culture   Final    NO GROWTH 2 DAYS Performed at [facility], [address]  [place], [place] [address]    Report Status PENDING  Incomplete  Culture, blood (Routine X 2) w Reflex to ID Panel     Status: None (Preliminary result)   Collection Time: 01/21/24  6:22 AM   Specimen: BLOOD RIGHT ARM  Result Value Ref Range Status   Specimen Description BLOOD RIGHT ARM  Final   Special Requests   Final    BOTTLES DRAWN AEROBIC AND ANAEROBIC Blood Culture results may not be optimal due to an inadequate volume of blood received in culture bottles   Culture  Setup Time   Final    Organism ID to follow AEROBIC BOTTLE ONLY YEAST    Culture   Final    YEAST Performed at [facility], [address] [place], [place] [address]    Report Status PENDING  Incomplete  MRSA Next Gen by PCR, Nasal     Status: None   Collection Time: 01/21/24  8:37 AM   Specimen: Nasal Mucosa; Nasal Swab  Result Value Ref Range Status   MRSA by PCR Next Gen NOT DETECTED NOT DETECTED Final    Comment: (NOTE) The GeneXpert MRSA Assay (FDA approved for NASAL specimens only), is  one component of a comprehensive MRSA colonization surveillance program. It is not intended to diagnose MRSA infection nor to guide or monitor treatment for MRSA infections. Test performance is not FDA approved in patients less than 71 years old. Performed at [facility], [address] [place], [place] [address]   Culture, Respiratory w Gram Stain     Status: None (Preliminary result)   Collection Time: 01/22/24  1:53 PM   Specimen: Tracheal Aspirate; Respiratory  Result Value Ref Range Status   Specimen Description   Final    TRACHEAL ASPIRATE Performed at [facility], [address] [place], [place] [address]    Special Requests   Final    NONE Performed at [facility], [address] [place], [place] [address]    Gram Stain   Final    MODERATE WBC PRESENT, PREDOMINANTLY PMN FEW GRAM POSITIVE COCCI Performed at [facility], [address], [place], [place] [address]    Culture PENDING  Incomplete   Report Status PENDING  Incomplete    Gardiner Barefoot, MD [facility] [email or web address] 01/23/2024, 1:52 PM

## 2024-01-23 NOTE — Progress Notes (Signed)
 PHARMACY - PHYSICIAN COMMUNICATION CRITICAL VALUE ALERT - BLOOD CULTURE IDENTIFICATION (BCID)  Carlos Richard is an 89 y.o. male who presented to [facility] on 01/21/2024 with a chief complaint of cardiac arrest  Assessment:  blood culture from 3/8 initially read as yeast on gram stain was corrected to GPR after being read by several lab personnel.  BCID did not detect yeast or any other organism.  Being treated for pneumonia  Name of physician (or Provider) Contacted: Dr Luciana Axe, Dr Larinda Buttery, Zada Girt, NP  Current antibiotics: piperacillin/tazobactam, azithromycin  Changes to prescribed antibiotics recommended:  Patient is on recommended antibiotics - No changes needed  Results for orders placed or performed during the hospital encounter of 01/21/24  Blood Culture ID Panel (Reflexed) (Collected: 01/21/2024  6:22 AM)  Result Value Ref Range   Enterococcus faecalis NOT DETECTED NOT DETECTED   Enterococcus Faecium NOT DETECTED NOT DETECTED   Listeria monocytogenes NOT DETECTED NOT DETECTED   Staphylococcus species NOT DETECTED NOT DETECTED   Staphylococcus aureus (BCID) NOT DETECTED NOT DETECTED   Staphylococcus epidermidis NOT DETECTED NOT DETECTED   Staphylococcus lugdunensis NOT DETECTED NOT DETECTED   Streptococcus species NOT DETECTED NOT DETECTED   Streptococcus agalactiae NOT DETECTED NOT DETECTED   Streptococcus pneumoniae NOT DETECTED NOT DETECTED   Streptococcus pyogenes NOT DETECTED NOT DETECTED   A.calcoaceticus-baumannii NOT DETECTED NOT DETECTED   Bacteroides fragilis NOT DETECTED NOT DETECTED   Enterobacterales NOT DETECTED NOT DETECTED   Enterobacter cloacae complex NOT DETECTED NOT DETECTED   Escherichia coli NOT DETECTED NOT DETECTED   Klebsiella aerogenes NOT DETECTED NOT DETECTED   Klebsiella oxytoca NOT DETECTED NOT DETECTED   Klebsiella pneumoniae NOT DETECTED NOT DETECTED   Proteus species NOT DETECTED NOT DETECTED   Salmonella species NOT DETECTED NOT  DETECTED   Serratia marcescens NOT DETECTED NOT DETECTED   Haemophilus influenzae NOT DETECTED NOT DETECTED   Neisseria meningitidis NOT DETECTED NOT DETECTED   Pseudomonas aeruginosa NOT DETECTED NOT DETECTED   Stenotrophomonas maltophilia NOT DETECTED NOT DETECTED   Candida albicans NOT DETECTED NOT DETECTED   Candida auris NOT DETECTED NOT DETECTED   Candida glabrata NOT DETECTED NOT DETECTED   Candida krusei NOT DETECTED NOT DETECTED   Candida parapsilosis NOT DETECTED NOT DETECTED   Candida tropicalis NOT DETECTED NOT DETECTED   Cryptococcus neoformans/gattii NOT DETECTED NOT DETECTED    Juliette Alcide, PharmD, BCPS, BCIDP Work Cell: [phone] 01/23/2024 3:35 PM

## 2024-01-23 NOTE — Consult Note (Signed)
 PHARMACY CONSULT NOTE - ELECTROLYTES  Pharmacy Consult for Electrolyte Monitoring and Replacement   Recent Labs: Height: 5' 9.02" (175.3 cm) Weight: 77.5 kg (170 lb 13.7 oz) IBW/kg (Calculated) : 70.74 Estimated Creatinine Clearance: 27.8 mL/min (A) (by C-G formula based on SCr of 1.8 mg/dL (H)). Potassium (mmol/L)  Date Value  01/23/2024 3.6  11/13/2013 3.6   Magnesium (mg/dL)  Date Value  98/09/9146 2.5 (H)   Calcium (mg/dL)  Date Value  82/95/6213 7.7 (L)   Calcium, Total (mg/dL)  Date Value  08/65/7846 8.8   Albumin (g/dL)  Date Value  96/29/5284 2.9 (L)  09/20/2023 4.2  11/13/2013 3.8   Phosphorus (mg/dL)  Date Value  13/24/4010 3.2   Sodium (mmol/L)  Date Value  01/23/2024 140  10/31/2023 145 (H)  11/13/2013 141    Assessment  Carlos Richard is a 89 y.o. male presenting with chest pain. PMH significant for atrial fibrillation with slow ventricular response, B12 deficiency, HTN, HLD, COPD, essential tremors (ET), left thalamic DBS stimulator. Pharmacy has been consulted to monitor and replace electrolytes.  MIVF: D5W LR @ 139mL/hr  Goal of Therapy: Electrolytes WNL (K > 4 and Mg >2  due to afib) Corrected Ca: 8.6 mg/dL   Plan:  Give 10 mEq of Kcl IV x 2 Check BMP, Mg, Phos with AM labs  Thank you for allowing pharmacy to be a part of this patient's care.  Effie Shy, PharmD Pharmacy Resident  01/23/2024 6:30 AM

## 2024-01-23 NOTE — TOC Initial Note (Signed)
 Transition of Care [facility]) - Initial/Assessment Note    Patient Details  Name: Carlos Richard MRN: [id number] Date of Birth: Oct 06, 1932  [facility] of [facility]) CM/SW Contact:    Garret Reddish, RN Phone Number: 01/23/2024, 12:05 PM  Clinical Narrative:                 Chart reviewed.  Noted that patient was admitted with Stemi.  Patient was admitted to ICU s/p PEA arrest.    I have meet with patient's wife Mrs. Vanaman and her sons today at bedside. I have introduced myself and my role as ICU case manager.    Mrs. Bouillon reports that prior to admission patient was able to ambulate with a cane at home.  Mrs. Corkum reports that patient had dementia and was hard for him to remember things from the past.  Mrs. Desaulniers reports that she would assist with his bathing and dressing.  Patient needed assistance with putting his socks and shoes on.  Mrs. Huaracha reports that patient was not driving.  She was transporting patient to the doctor appointments.  Patient was able to feed himself.    Noted that Cardiology has been consulted.  Patient also has closed right hip fracture.  Orthopedics to be consulted.    TOC will continue to follow for discharge planning.       Expected Discharge Plan:  (TBD) Barriers to Discharge: No Barriers Identified   Patient Goals and CMS Choice            Expected Discharge Plan and Services   Discharge Planning Services: CM Consult   Living arrangements for the past 2 months: Single Family Home                                      Prior Living Arrangements/Services Living arrangements for the past 2 months: Single Family Home Lives with:: Spouse            Care giver support system in place?: Yes (comment) (Patient has supportive wife and 2 supportive sons.) Current home services:  (Patient has a cane for home use.)    Activities of Daily Living   ADL Screening (condition at time of admission) Independently performs ADLs?:  No Does the patient have a NEW difficulty with bathing/dressing/toileting/self-feeding that is expected to last >3 days?: No Does the patient have a NEW difficulty with getting in/out of bed, walking, or climbing stairs that is expected to last >3 days?: No Does the patient have a NEW difficulty with communication that is expected to last >3 days?: No Is the patient deaf or have difficulty hearing?: No Does the patient have difficulty seeing, even when wearing glasses/contacts?: No Does the patient have difficulty concentrating, remembering, or making decisions?: No  Permission Sought/Granted                  Emotional Assessment Appearance:: Appears stated age     Orientation: :  (Patient is intubated)      Admission diagnosis:  STEMI (ST elevation myocardial infarction) (HCC) [I21.3] Cardiac arrest with successful resuscitation [facility]) [I46.9] Patient Active Problem List   Diagnosis Date Noted   STEMI (ST elevation myocardial infarction) (HCC) 01/21/2024   Cardiac arrest with successful resuscitation (HCC) 01/21/2024   Closed fracture of right hip (HCC) 01/21/2024   Shock (HCC) 01/21/2024   Aspiration pneumonia of both lungs (HCC) 01/21/2024   Acute  confusion 05/10/2023   S/P deep brain stimulator placement for essential tremor 2006 at [facility] 05/10/2023   Abnormal chest x-ray - RLL 05/10/2023   History of syncope 05/10/2023   B12 deficiency 03/16/2023   Oxygen dependent 03/03/2023   Syncope 02/20/2023   HTN (hypertension) 02/20/2023   Chronic kidney disease, stage 3a (HCC) 02/20/2023   Atrial fibrillation, chronic (HCC) 02/20/2023   HLD (hyperlipidemia) 02/20/2023   Minimal cognitive impairment 02/14/2023   Orthostatic hypotension 08/07/2021   Senile purpura (HCC) 06/01/2021   Near syncope 10/31/2019   CKD (chronic kidney disease) stage 3, GFR 30-59 ml/min 08/07/2018   Gout 08/03/2018   Mixed hyperlipidemia 08/03/2018   Benign hypertensive renal disease    COPD  (chronic obstructive pulmonary disease) (HCC)    Atrial fibrillation with slow ventricular response (HCC)    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 - [place], [place] - [address] [place] [address] Phone: [phone] Fax: [phone]  [facility] - [place], [place] - [address] [place] [address] Phone: [phone] Fax: [phone]  [id number] - [place], [place] - [address] [place] [address] Phone: [phone] Fax: [phone]     Social Drivers of Health (SDOH) Social History: SDOH Screenings   Food Insecurity: No Food Insecurity (01/21/2024)  Housing: Low Risk  (01/21/2024)  Transportation Needs: No Transportation Needs (01/21/2024)  Utilities: Not At Risk (01/21/2024)  Alcohol Screen: Low Risk  (03/08/2023)  Depression (PHQ2-9): Low Risk  (10/31/2023)  Financial Resource Strain: Low Risk  (03/08/2023)  Physical Activity: Insufficiently Active (03/08/2023)  Social Connections: Unknown (01/21/2024)  Stress: No Stress Concern Present (03/08/2023)  Tobacco Use: Medium Risk (12/30/2023)   SDOH Interventions:     Readmission Risk Interventions     No data to display

## 2024-01-23 NOTE — Plan of Care (Signed)
  Problem: Clinical Measurements: Goal: Ability to maintain clinical measurements within normal limits will improve Outcome: Progressing Goal: Cardiovascular complication will be avoided Outcome: Progressing   Problem: Clinical Measurements: Goal: Will remain free from infection Outcome: Not Progressing Goal: Diagnostic test results will improve Outcome: Not Progressing Goal: Respiratory complications will improve Outcome: Not Progressing   Problem: Activity: Goal: Risk for activity intolerance will decrease Outcome: Not Progressing   Problem: Nutrition: Goal: Adequate nutrition will be maintained Outcome: Not Progressing   Problem: Coping: Goal: Level of anxiety will decrease Outcome: Not Progressing   Problem: Elimination: Goal: Will not experience complications related to bowel motility Outcome: Not Progressing Goal: Will not experience complications related to urinary retention Outcome: Not Progressing

## 2024-01-23 NOTE — Consult Note (Signed)
 PHARMACY - ANTICOAGULATION CONSULT NOTE  Pharmacy Consult for Heparin Infusion Indication: atrial fibrillation  No Known Allergies  Patient Measurements: Height: 5' 9.02" (175.3 cm) Weight: 77.5 kg (170 lb 13.7 oz) IBW/kg (Calculated) : 70.74 Heparin Dosing Weight: 71.2 kg   Vital Signs: Temp: 97.9 F (36.6 C) (03/10 0400) Temp Source: Oral (03/10 0400) BP: 96/77 (03/10 0500) Pulse Rate: 68 (03/10 0500)  Labs: Recent Labs    01/21/24 0254 01/21/24 0350 01/21/24 0609 01/21/24 0904 01/21/24 1258 01/21/24 1658 01/21/24 2347 01/22/24 0403 01/22/24 1039 01/22/24 2041 01/23/24 0450  HGB 13.3  --   --   --  13.2  --   --  13.2  --   --  10.2*  HCT 41.9  --   --   --  39.3  --   --  39.2  --   --  31.5*  PLT 215  --   --   --  196  --   --  188  --   --  104*  APTT  --    < > 124*  --   --   --  >200*  --  199* 105* 66*  LABPROT  --   --  23.0*  --   --   --   --   --   --   --   --   INR  --   --  2.0*  --   --   --   --   --   --   --   --   HEPARINUNFRC  --   --   --   --   --  >1.10* >1.10*  --   --   --  0.44  CREATININE  --    < >  --   --  1.76*  --   --  2.39*  --   --  1.80*  CKMB  --   --   --  19.0*  --   --   --   --   --   --   --   TROPONINIHS 16  --  482*  --  1,118*  --   --   --   --   --   --    < > = values in this interval not displayed.   Estimated Creatinine Clearance: 27.8 mL/min (A) (by C-G formula based on SCr of 1.8 mg/dL (H)).  Medical History: Past Medical History:  Diagnosis Date   Atrial fibrillation (HCC)    COPD (chronic obstructive pulmonary disease) (HCC)    Hypertension    Near syncope    Tremors of nervous system    Assessment: Carlos Richard is a 89 y.o. male presenting with STEMI. PMH significant for essential tremor, BPH, COPD, AF, HLD, CKD, HTN. Patient was on [facility] PTA per chart review. From chart review, patient takes Xarelto 15 mg daily at home. Per patient's wife, last dose was taken 3/7 afternoon.  Pharmacy has been  consulted to initiate and manage heparin infusion.   Baseline Labs: aPTT 124, PT 23.0, INR 2.0, Hgb 13.3, Hct 41.9, Plt 215   Goal of Therapy:  Heparin level 0.3-0.7 units/ml aPTT 66-102 seconds Monitor platelets by anticoagulation protocol: Yes  Date Time aPTT/HL Rate/Comment  3/9 2347 >200/>1.10 12050/supratherapeutic 3/9 1039 199/---  850/supratherapeutic 3/9 2041 105/---  650/supratherapeutic 3/10 0450 66 / 0.44 550/therapeutic x 1  Plan:  Continue heparin infusion at 550 units/hr Check aPTT in 8  hours to confirm Continue to monitor aPTT until HL and aPTT correlate.  Check HL daily for correlation until HL and aPTT correlate. Switch to HL monitoring once HL and aPTT correlate.  Continue to monitor H&H and platelets daily while on heparin infusion    Otelia Sergeant, PharmD, [facility] 01/23/2024 5:45 AM

## 2024-01-23 NOTE — Consult Note (Signed)
 PHARMACY - ANTICOAGULATION CONSULT NOTE  Pharmacy Consult for Heparin Infusion Indication: atrial fibrillation  No Known Allergies  Patient Measurements: Height: 5' 9.02" (175.3 cm) Weight: 77.5 kg (170 lb 13.7 oz) IBW/kg (Calculated) : 70.74 Heparin Dosing Weight: 71.2 kg   Vital Signs: Temp: (P) 100 F (37.8 C) (03/10 1200) Temp Source: (P) Axillary (03/10 1200) BP: 113/52 (03/10 1300) Pulse Rate: 61 (03/10 1300)  Labs: Recent Labs    01/21/24 0609 01/21/24 0904 01/21/24 1258 01/21/24 1658 01/21/24 2347 01/22/24 0403 01/22/24 1039 01/22/24 2041 01/23/24 0450 01/23/24 0757 01/23/24 1229  HGB  --   --  13.2  --   --  13.2  --   --  10.2*  --   --   HCT  --   --  39.3  --   --  39.2  --   --  31.5*  --   --   PLT  --   --  196  --   --  188  --   --  104*  --   --   APTT 124*  --   --   --  >200*  --    < > 105* 66*  --  49*  LABPROT 23.0*  --   --   --   --   --   --   --   --   --   --   INR 2.0*  --   --   --   --   --   --   --   --   --   --   HEPARINUNFRC  --   --   --  >1.10* >1.10*  --   --   --  0.44  --   --   CREATININE  --   --  1.76*  --   --  2.39*  --   --  1.80*  --   --   CKMB  --  19.0*  --   --   --   --   --   --   --   --   --   TROPONINIHS 482*  --  1,118*  --   --   --   --   --   --  540*  --    < > = values in this interval not displayed.   Estimated Creatinine Clearance: 27.8 mL/min (A) (by C-G formula based on SCr of 1.8 mg/dL (H)).  Medical History: Past Medical History:  Diagnosis Date   Atrial fibrillation (HCC)    COPD (chronic obstructive pulmonary disease) (HCC)    Hypertension    Near syncope    Tremors of nervous system    Assessment: Carlos Richard is a 89 y.o. male presenting with STEMI. PMH significant for essential tremor, BPH, COPD, AF, HLD, CKD, HTN. Patient was on [facility] PTA per chart review. From chart review, patient takes Xarelto 15 mg daily at home. Per patient's wife, last dose was taken 3/7 afternoon.  Pharmacy has  been consulted to initiate and manage heparin infusion.   Baseline Labs: aPTT 124, PT 23.0, INR 2.0, Hgb 13.3, Hct 41.9, Plt 215   Goal of Therapy:  Heparin level 0.3-0.7 units/ml aPTT 66-102 seconds Monitor platelets by anticoagulation protocol: Yes  Date Time aPTT/HL Rate/Comment  3/9 2347 >200/>1.10 12050/supratherapeutic 3/9 1039 199/---  850/supratherapeutic 3/9 2041 105/---  650/supratherapeutic 3/10 0450 66 / 0.44 550/therapeutic x 1 3/10 1229 49/---  550/SUBtherapeutic  Plan:  Give heparin bolus 2,000 x 1  Increase heparin infusion to 750 units/hr from 550 units/hr Check aPTT in 8 hours after increase Continue to monitor aPTT until HL and aPTT correlate.  Check HL daily for correlation until HL and aPTT correlate. Switch to HL monitoring once HL and aPTT correlate.  Continue to monitor H&H and platelets daily while on heparin infusion    Thank you allowing pharmacy to participate in this patient's care.   Effie Shy, PharmD Pharmacy Resident  01/23/2024 1:46 PM

## 2024-01-23 NOTE — Progress Notes (Signed)
 EEG complete - results pending

## 2024-01-23 NOTE — Consult Note (Signed)
 ORTHOPAEDIC CONSULTATION  REQUESTING PHYSICIAN: Janann Colonel, MD  Chief Complaint:   R hip fracture  History of Present Illness: History obtained from patient's 2 sons at bedside, review of medical record, and discussion with medical providers.   Carlos Richard is a 89 y.o. male who was found to have an incidental displaced R femoral neck fracture on CT C/A/P.Marland Kitchen He is currently admitted to the ICU after he was found to have NSTEMI/cardiac arrest. He is intubated and sedated currently. He was found to have a-fib with RVR this morning. He also has an aspiration pneumonia and was found to have +blood cultures for yeast. He also had seizure like activity after presentation to the ED.   Prior to admission, his sons deny any antecedent R hip pain. He was ambulatory without assistive devices. No known falls. He has had L hip hemiarthroplasty after femoral neck fracture in 2022 in [place].    Past Medical History:  Diagnosis Date   Atrial fibrillation (HCC)    COPD (chronic obstructive pulmonary disease) (HCC)    Hypertension    Near syncope    Tremors of nervous system    Past Surgical History:  Procedure Laterality Date   BACK SURGERY     BRAIN SURGERY     stimulator placement for tremors   CORONARY/GRAFT ACUTE MI REVASCULARIZATION N/A 01/21/2024   Procedure: Coronary/Graft Acute MI Revascularization;  Surgeon: Alwyn Pea, MD;  Location: [facility];  Service: Cardiovascular;  Laterality: N/A;   EYE SURGERY Left 2020   cataract surgery nov    EYE SURGERY Right 2020   cataract surgery dec   LEFT HEART CATH AND CORONARY ANGIOGRAPHY N/A 01/21/2024   Procedure: LEFT HEART CATH AND CORONARY ANGIOGRAPHY;  Surgeon: Alwyn Pea, MD;  Location: [facility];  Service: Cardiovascular;  Laterality: N/A;   Social History   Socioeconomic History   Marital status: Married    Spouse name: Not on  file   Number of children: Not on file   Years of education: Not on file   Highest education level: Associate degree: academic program  Occupational History   Not on file  Tobacco Use   Smoking status: Former    Current packs/day: 0.00    Types: Cigarettes    Quit date: 1980    Years since quitting: 45.2   Smokeless tobacco: Never   Tobacco comments:    quit 40 years ago   Vaping Use   Vaping status: Never Used  Substance and Sexual Activity   Alcohol use: Yes    Comment: occasionally   Drug use: No   Sexual activity: Yes  Other Topics Concern   Not on file  Social History Narrative   Not on file   Social Drivers of Health   Financial Resource Strain: Low Risk  (03/08/2023)   Overall Financial Resource Strain (CARDIA)    Difficulty of Paying Living Expenses: Not hard at all  Food Insecurity: No Food Insecurity (01/21/2024)   Hunger Vital Sign    Worried About Running Out of Food in the Last Year: Never true    Ran Out of Food in the Last Year: Never true  Transportation Needs: No Transportation Needs (01/21/2024)   PRAPARE - Administrator, Civil Service (Medical): No    Lack of Transportation (Non-Medical): No  Physical Activity: Insufficiently Active (03/08/2023)   Exercise Vital Sign    Days of Exercise per Week: 2 days    Minutes of  Exercise per Session: 20 min  Stress: No Stress Concern Present (03/08/2023)   [employer] of Occupational Health - Occupational Stress Questionnaire    Feeling of Stress : Not at all  Social Connections: Unknown (01/21/2024)   Social Connection and Isolation Panel [NHANES]    Frequency of Communication with Friends and Family: More than three times a week    Frequency of Social Gatherings with Friends and Family: Three times a week    Attends Religious Services: Never    Active Member of Clubs or Organizations: Patient unable to answer    Attends Banker Meetings: Patient unable to answer    Marital Status:  Married   Family History  Problem Relation Age of Onset   Tuberculosis Mother    Tremor Paternal Grandmother    Obesity Sister    Prostate cancer Neg Hx    Bladder Cancer Neg Hx    Kidney cancer Neg Hx    No Known Allergies Prior to Admission medications   Medication Sig Start Date End Date Taking? Authorizing Provider  fluticasone-salmeterol (WIXELA INHUB) 250-50 MCG/ACT AEPB Inhale 1 puff into the lungs in the morning and at bedtime.   Yes [provider]  allopurinol (ZYLOPRIM) 100 MG tablet Take 1 tablet (100 mg total) by mouth daily. Monday, Wednesday, Friday 07/01/23   Olevia Perches P, DO  amLODipine (NORVASC) 5 MG tablet Take 1 tablet (5 mg total) by mouth daily. 09/30/23 09/29/24  Laurier Nancy, MD  furosemide (LASIX) 20 MG tablet TAKE 1 TABLET DAILY. Patient taking differently: Take 20 mg by mouth every Monday, Wednesday, and Friday. 09/01/23   Laurier Nancy, MD  losartan (COZAAR) 100 MG tablet Take 1 tablet (100 mg total) by mouth daily. 11/21/23   Johnson, Megan P, DO  magnesium oxide (MAG-OX) 400 MG tablet Take 400 mg by mouth daily.    [provider]  memantine (NAMENDA) 5 MG tablet Take 5 mg by mouth 2 (two) times daily. 10/26/23   [provider]  metoprolol succinate (TOPROL-XL) 50 MG 24 hr tablet Take 1 tablet (50 mg total) by mouth daily. 11/18/23   Johnson, Megan P, DO  metoprolol tartrate (LOPRESSOR) 50 MG tablet Take by mouth.    [provider]  Multiple Vitamins-Minerals (ZINC PO) Take 1 tablet by mouth daily.    [provider]  simvastatin (ZOCOR) 20 MG tablet Take 1 tablet (20 mg total) by mouth daily. 10/27/23   Johnson, Megan P, DO  XARELTO 15 MG TABS tablet Take 1 tablet (15 mg total) by mouth daily with supper. 12/20/23   Laurier Nancy, MD  Zinc 50 MG TABS Take 50 mg by mouth daily.     [provider]   Recent Labs    01/21/24 1610 01/21/24 0350 01/21/24 9604 01/21/24 1258 01/22/24 0403  01/23/24 0450  WBC 14.9*  --   --  24.7* 22.9* 11.5*  HGB 13.3  --   --  13.2 13.2 10.2*  HCT 41.9  --   --  39.3 39.2 31.5*  PLT 215  --   --  196 188 104*  K  --    < >  --  3.6 4.4 3.6  CL  --    < >  --  109 107 107  CO2  --    < >  --  21* 21* 26  BUN  --    < >  --  16 24* 27*  CREATININE  --    < >  --  1.76* 2.39* 1.80*  GLUCOSE  --    < >  --  195* 173* 173*  CALCIUM  --    < >  --  8.2* 8.3* 7.7*  INR  --   --  2.0*  --   --   --    < > = values in this interval not displayed.   DG Chest Port 1 View Result Date: 01/23/2024 CLINICAL DATA:  Evaluate enteric tube placement. EXAM: PORTABLE CHEST 1 VIEW COMPARISON:  01/23/2024 FINDINGS: Interval placement of enteric tube. The tip of the tube is below the GE junction in the expected location of the gastric fundus. Right lower lung opacities are again noted and appears similar to the exam from earlier today. IMPRESSION: Enteric tube tip is below the GE junction in the expected location of the gastric fundus. Electronically Signed   By: Signa Kell M.D.   On: 01/23/2024 11:42   DG Chest 1 View Result Date: 01/23/2024 CLINICAL DATA:  Shortness breath.  Support. EXAM: CHEST  1 VIEW COMPARISON:  01/21/2024 FINDINGS: Heel endotracheal tube tip 4 cm above the carina. Right internal jugular central line tip in the SVC above the right atrium. No nasogastric tube. Left lung remains largely clear. Slight improvement in previously seen right lung pneumonia. No worsening or new finding. IMPRESSION: 1. Endotracheal tube tip 4 cm above the carina. Right internal jugular central line tip in the SVC above the right atrium. 2. Slight improvement in previously seen right lung pneumonia. Electronically Signed   By: Paulina Fusi M.D.   On: 01/23/2024 07:00     Positive ROS: All other systems have been reviewed and were otherwise negative with the exception of those mentioned in the HPI and as above.  Physical Exam: BP (!) 114/52   Pulse 61   Temp 99.1  F (37.3 C) (Axillary)   Resp (!) 22   Ht 5' 9.02" (1.753 m)   Wt 77.5 kg   SpO2 99%   BMI 25.22 kg/m  General:  intubated, sedated   Orthopedic Exam:  RLE: Motor/sens exam deferred due to mental status Foot wwp   Imaging:  As above: R displaced femoral neck fracture  Assessment/Plan: Carlos Richard is a 89 y.o. male with a R displaced femoral neck fracture   1. I discussed the various treatment options including both surgical and non-surgical management of the fracture with the patient's sons at the bedside. We discussed that usually we would recommend R hip hemiarthroplasty similar to recommendation on contralateral side. However, in light of multiple, more acute medical issues, we agreed to continue to monitor patient's clinical condition. If patient was to make a meaningful clinical recovery, we could certainly consider R hip hemiarthroplasty at that time.   2. In there interim, no specific treatment would be required for femoral neck fracture. Patient would likely experience pain with motion of the RLE.   3. Will follow peripherally in the interim. Please page if clinical condition starts to improve.    Signa Kell   01/23/2024 5:40 PM

## 2024-01-23 NOTE — Progress Notes (Signed)
 NAME:  Carlos Richard, MRN:  [id number], DOB:  12-27-1931, LOS: 2 ADMISSION DATE:  01/21/2024, CHIEF COMPLAINT: Respiratory failure and Cardiac Arrest    History of Present Illness:   89 y.o male with significant PMH of atrial fibrillation with slow ventricular response, B12 deficiency, HTN, HLD, COPD, essential tremors (ET), left thalamic DBS stimulator for ET who presented to the ED as a code STEMI.   Per ED reports, patient was sleeping when he woke up screaming.  Patient's wife found him with agonal respiration and started CPR although unclear whether he had a pulse or not.  EMS was called and on their arrival patient was found to be hypotensive, altered with agonal respirations.  Levophed was started with slight improvement in his blood pressure.  Initial EKG concerning for ST elevation in V1 through V3 therefore code STEMI called en route to the ED.   ED Course: Initial vital signs showed HR of beats/minute, BP mm Hg, the RR 30 breaths/minute, and the oxygen saturation % on and a temperature of 98.55F (36.9C).  He was awake somnolent but able to answer questions with only complaint of back pain.  Patient was discussed with STEMI on call who recommended activating Cath Lab.  Due to concern for possible dissection, patient was taken to CT for further evaluation.  Upon return from CT patient had a witnessed generalized tonic-clonic seizure activity.  He was brought back to the ED immediately where he decompensated.  Patient dropped his blood pressure, became bradycardic and then went into asystole.  CPR/ACLS initiated for approximately 15 minutes prior to ROSC achieved.  Patient intubated in the ED.  He was started on Levophed and propofol and taken emergently to Cath Lab.   Pertinent Labs/Diagnostics Findings: Na+/ K+: 139/3.6  Glucose: 296 BUN/Cr.:13/1.47, CO2 14, anion gap 23 WBC: K/L without bands or neutrophil predominance   troponin:   CXR> CTH> CTA Chest> CT Abd/pelvis> Medication  administered in the ED: Disposition: Patient was transferred to the ICU post cardiac cath intubated and sedated.  PCCM consulted  Pertinent  Medical History  Atrial Fibrillation  COPD HTN Essential Tremor   Micro Data:   03/8: Blood x2>>yeast aerobic bottle only  03/08: MRSA PCR>>negative  03/09: Tracheal aspirate>>moderate wbc present, few gram positive cocci   Anti-infectives (From admission, onward)    Start     Dose/Rate Route Frequency Ordered Stop   01/21/24 0900  azithromycin (ZITHROMAX) 500 mg in sodium chloride 0.9 % 250 mL IVPB        500 mg 250 mL/hr over 60 Minutes Intravenous Every 24 hours 01/21/24 0726 01/20/2024 0859   01/21/24 0800  piperacillin-tazobactam (ZOSYN) IVPB 3.375 g        3.375 g 12.5 mL/hr over 240 Minutes Intravenous Every 8 hours 01/21/24 0729 02/02/2024 0759      Significant Hospital Events: Including procedures, antibiotic start and stop dates in addition to other pertinent events   3/8: intubated s/p PEA arrest, admit to ICU, LHC negative 3/9: remains intubated, passed SBT, but with depressed mental status and increased secretions 3/10: Overnight pt became bradycardic hr 30 to low 40's secondary to precedex gtt which was discontinued.  Following discontinuation of precedex gtt pt became agitated/combative attempting to hit/bite staff.  This morning pt converted to atrial fibrillation with rvr requiring amiodarone gtt.  He also became hypoxic O2 sats in the 70's FiO2 increased from 40% to 70%.  Propofol gtt restarted.  Converted back to NSR.  Currently requiring levophed gtt  at 17 mcg/min to maintain map 65 or higher.    Interim History / Subjective:  As outlined above under significant events   Objective   Blood pressure (!) 99/52, pulse 69, temperature 98.7 F (37.1 C), resp. rate 13, height 5' 9.02" (1.753 m), weight 77.5 kg, SpO2 98%.    Vent Mode: PRVC FiO2 (%):  [40 %-70 %] 50 % Set Rate:  [22 bmp] 22 bmp Vt Set:  [400 mL] 400 mL PEEP:   [5 cmH20] 5 cmH20 Pressure Support:  [5 cmH20] 5 cmH20 Plateau Pressure:  [11 cmH20-19 cmH20] 11 cmH20   Intake/Output Summary (Last 24 hours) at 01/23/2024 1238 Last data filed at 01/23/2024 1055 Gross per 24 hour  Intake 3517.43 ml  Output 540 ml  Net 2977.43 ml   Filed Weights   01/21/24 1500 01/22/24 0500 01/23/24 0458  Weight: 71.2 kg 75.8 kg 77.5 kg   Examination: General: Acute on chronically-ill appearing male, NAD mechanically intubated  HENT: Supple, no JVD  Lungs: Faint rhonchi throughout, even, non labored  Cardiovascular: NSR, s1s2, no m/r/g, 2+ radial/1+ distal pulses Abdomen: +BS x4, soft, non tender, non distended  Extremities: No edema  Neuro: Sedated, not following commands, withdraws from stimulation, PERRL  GU: Indwelling foley catheter draining yellow urine   Resolved Hospital Problem list     Assessment & Plan:   #Acute metabolic encephalopathy  #Acute pain secondary to acute/subacute right femur fracture #Possible seizure-like activity  #Mechanical intubation pain/discomfort  Hx: Tremors  - Correct metabolic derangements and treat acute pain - Maintain RASS goal 0 to -1 - PAD protocol to maintain RASS goal: Propofol and fentanyl gtts; did not tolerate precedex gtt due to severe bradycardia - WUA daily  - EEG pending  - Seizure precautions   #Distributive shock unclear source  #Cardiac arrest  #Atrial fibrillation with rvr~resolved #STEMI~cardiac cath 01/21/24 revealed no CAD  Hx: HTN and near syncope  Echo 01/21/2024: EF 50 to 55%; right ventricular systolic function mildly reduced; trivial mitral valve regurgitation; mild tricuspid valve regurgitation, mild aortic valve regurgitation  - Continuous telemetry monitoring  - Troponin peaked at 1,118 - Coox and CVP pending to determine fluid balance  - Prn levophed and/or vasopressin gtts to maintain map 65 or higher  - Continue amiodarone gtt for now  - Continue heparin gtt: dosing per pharmacy   - Cardiology consulted appreciate input  - Hold outpatient antihypertensives for now   #Acute respiratory failure in the setting of cardiac arrest  #Mechanical intubation  Hx: COPD  - Full vent support for now: vent settings reviewed and established  - Continue lung protective strategies  - Maintain plateau pressures less than 30 cm H2O - VAP bundle implemented  - SBT once all parameters met  - Intermittent CXR and ABG's   #Acute kidney injury secondary to ATN #Lactic acidosis~improving  - Trend BMP  - Replace electrolytes as indicated - Strict I&O's - Avoid nephrotoxic agents as able   #Aspiration pneumonia  - Trend WBC and monitor fever curve  - Follow cultures  - Continue abx as outlined pending culture results and sensitivities   #Anemia without signs of overt bleeding  #Thrombocytopenia  - Trend CBC  - Monitor for s/sx of bleeding  - Transfuse for hgb <8  #Right femoral neck fracture  - Fentanyl gtt for pain management  - Ortho consulted appreciate input   #Endo  - CBG's q4hrs  - SSI  - Follow hyper/hypoglycemic protocol  - Target CBG range 140  to 180  Best Practice (right click and "Reselect all SmartList Selections" daily)   Diet/type: NPO DVT prophylaxis systemic heparin Pressure ulcer(s): N/A GI prophylaxis: PPI Lines: Central line Foley:  Yes, and it is still needed Code Status:  limited Last date of multidisciplinary goals of care discussion [01/23/2024]  03/10: Updated pts spouse and children at bedside regarding pts condition and plan of care.  All questions were answered.  TOC and Palliative Care consulted per family request  Labs   CBC: Recent Labs  Lab 01/21/24 0254 01/21/24 1258 01/22/24 0403 01/23/24 0450  WBC 14.9* 24.7* 22.9* 11.5*  HGB 13.3 13.2 13.2 10.2*  HCT 41.9 39.3 39.2 31.5*  MCV 95.0 92.3 92.9 95.5  PLT 215 196 188 104*    Basic Metabolic Panel: Recent Labs  Lab 01/21/24 0350 01/21/24 1258 01/22/24 0403  01/23/24 0450  NA 139 139 141 140  K 3.6 3.6 4.4 3.6  CL 102 109 107 107  CO2 14* 21* 21* 26  GLUCOSE 296* 195* 173* 173*  BUN 13 16 24* 27*  CREATININE 1.47* 1.76* 2.39* 1.80*  CALCIUM 11.2* 8.2* 8.3* 7.7*  MG 2.9* 2.6* 2.8* 2.5*  PHOS  --  3.6 5.8* 3.2   GFR: Estimated Creatinine Clearance: 27.8 mL/min (A) (by C-G formula based on SCr of 1.8 mg/dL (H)). Recent Labs  Lab 01/21/24 0254 01/21/24 0609 01/21/24 0622 01/21/24 1257 01/21/24 1258 01/21/24 1658 01/22/24 0403 01/23/24 0450 01/23/24 0757  PROCALCITON  --   --  <0.10  --   --   --   --   --   --   WBC 14.9*  --   --   --  24.7*  --  22.9* 11.5*  --   LATICACIDVEN  --    < > 6.5* 3.1*  --  2.6*  --   --  2.1*   < > = values in this interval not displayed.    Liver Function Tests: Recent Labs  Lab 01/21/24 0350 01/21/24 1258  AST 105* 146*  ALT 104* 118*  ALKPHOS 57 57  BILITOT 0.4 1.1  PROT 5.4* 5.3*  ALBUMIN 2.9* 2.9*   No results for input(s): "LIPASE", "AMYLASE" in the last 168 hours. No results for input(s): "AMMONIA" in the last 168 hours.  ABG    Component Value Date/Time   PHART 7.36 01/22/2024 0404   PCO2ART 40 01/22/2024 0404   PO2ART 119 (H) 01/22/2024 0404   HCO3 22.6 01/22/2024 0404   ACIDBASEDEF 2.7 (H) 01/22/2024 0404   O2SAT 99.9 01/22/2024 0404     Coagulation Profile: Recent Labs  Lab 01/21/24 0609  INR 2.0*    Cardiac Enzymes: Recent Labs  Lab 01/21/24 0904  CKMB 19.0*    HbA1C: HB A1C (BAYER DCA - WAIVED)  Date/Time Value Ref Range Status  12/07/2019 02:21 PM 5.7 <7.0 % Final    Comment:                                          Diabetic Adult            <7.0                                       Healthy Adult        4.3 - 5.7                                                           (  DCCT/NGSP) American Diabetes Association's Summary of Glycemic Recommendations for Adults with Diabetes: Hemoglobin A1c <7.0%. More stringent glycemic goals (A1c <6.0%) may further  reduce complications at the cost of increased risk of hypoglycemia.    Hgb A1c MFr Bld  Date/Time Value Ref Range Status  01/21/2024 06:09 AM 5.7 (H) 4.8 - 5.6 % Final    Comment:    (NOTE) Pre diabetes:          5.7%-6.4%  Diabetes:              >6.4%  Glycemic control for   <7.0% adults with diabetes     CBG: Recent Labs  Lab 01/22/24 1939 01/22/24 2352 01/23/24 0331 01/23/24 0741 01/23/24 1108  GLUCAP 199* 168* 192* 96 121*    Review of Systems:   Unable to assess pt mechanically intubated   Past Medical History:  He,  has a past medical history of Atrial fibrillation (HCC), COPD (chronic obstructive pulmonary disease) (HCC), Hypertension, Near syncope, and Tremors of nervous system.   Surgical History:   Past Surgical History:  Procedure Laterality Date   BACK SURGERY     BRAIN SURGERY     stimulator placement for tremors   CORONARY/GRAFT ACUTE MI REVASCULARIZATION N/A 01/21/2024   Procedure: Coronary/Graft Acute MI Revascularization;  Surgeon: Alwyn Pea, MD;  Location: [facility];  Service: Cardiovascular;  Laterality: N/A;   EYE SURGERY Left 2020   cataract surgery nov    EYE SURGERY Right 2020   cataract surgery dec   LEFT HEART CATH AND CORONARY ANGIOGRAPHY N/A 01/21/2024   Procedure: LEFT HEART CATH AND CORONARY ANGIOGRAPHY;  Surgeon: Alwyn Pea, MD;  Location: [facility];  Service: Cardiovascular;  Laterality: N/A;     Social History:   reports that he quit smoking about 45 years ago. His smoking use included cigarettes. He has never used smokeless tobacco. He reports current alcohol use. He reports that he does not use drugs.   Family History:  His family history includes Obesity in his sister; Tremor in his paternal grandmother; Tuberculosis in his mother. There is no history of Prostate cancer, Bladder Cancer, or Kidney cancer.   Allergies No Known Allergies   Home Medications  Prior to Admission medications    Medication Sig Start Date End Date Taking? Authorizing Provider  allopurinol (ZYLOPRIM) 100 MG tablet Take 1 tablet (100 mg total) by mouth daily. Monday, Wednesday, Friday 07/01/23   Olevia Perches P, DO  amLODipine (NORVASC) 5 MG tablet Take 1 tablet (5 mg total) by mouth daily. 09/30/23 09/29/24  Laurier Nancy, MD  fluticasone-salmeterol (ADVAIR) 250-50 MCG/ACT AEPB INHALE 1 PUFF INTO THE LUNGS TWICE DAILY 12/23/23   Olevia Perches P, DO  furosemide (LASIX) 20 MG tablet TAKE 1 TABLET DAILY. 09/01/23   Laurier Nancy, MD  losartan (COZAAR) 100 MG tablet Take 1 tablet (100 mg total) by mouth daily. 11/21/23   Johnson, Megan P, DO  magnesium oxide (MAG-OX) 400 MG tablet Take 400 mg by mouth daily.    [provider]  memantine (NAMENDA) 5 MG tablet Take 5mg  once daily for one week, then increase to 5mg  twice daily for memory 10/26/23   [provider]  metoprolol succinate (TOPROL-XL) 50 MG 24 hr tablet Take 1 tablet (50 mg total) by mouth daily. 11/18/23   Johnson, Megan P, DO  metoprolol tartrate (LOPRESSOR) 50 MG tablet Take by mouth.    [provider]  Multiple Vitamins-Minerals (ZINC PO)  Take 1 tablet by mouth daily.    [provider]  simvastatin (ZOCOR) 20 MG tablet Take 1 tablet (20 mg total) by mouth daily. 10/27/23   Johnson, Megan P, DO  triamcinolone cream (KENALOG) 0.1 % Apply 1 Application topically 2 (two) times daily. 10/31/23   Johnson, Megan P, DO  XARELTO 15 MG TABS tablet Take 1 tablet (15 mg total) by mouth daily with supper. 12/20/23   Laurier Nancy, MD  Zinc 50 MG TABS Take 50 mg by mouth daily.     [provider]     Critical care time: 70 minutes      Zada Girt, AGNP  Pulmonary/Critical Care Pager [phone] (please enter 7 digits) PCCM Consult Pager [phone] (please enter 7 digits)

## 2024-01-23 NOTE — Consult Note (Signed)
 PHARMACY - ANTICOAGULATION CONSULT NOTE  Pharmacy Consult for Heparin Infusion Indication: atrial fibrillation  No Known Allergies  Patient Measurements: Height: 5' 9.02" (175.3 cm) Weight: 77.5 kg (170 lb 13.7 oz) IBW/kg (Calculated) : 70.74 Heparin Dosing Weight: 71.2 kg   Vital Signs: Temp: 99.1 F (37.3 C) (03/10 1954) Temp Source: Axillary (03/10 1954) BP: 113/50 (03/10 2300) Pulse Rate: 54 (03/10 2300)  Labs: Recent Labs    01/21/24 0609 01/21/24 0904 01/21/24 1258 01/21/24 1658 01/21/24 2347 01/22/24 0403 01/22/24 1039 01/23/24 0450 01/23/24 0757 01/23/24 1229 01/23/24 1624 01/23/24 2249  HGB  --   --  13.2  --   --  13.2  --  10.2*  --   --  10.6*  --   HCT  --   --  39.3  --   --  39.2  --  31.5*  --   --  32.3*  --   PLT  --   --  196  --   --  188  --  104*  --   --   --   --   APTT 124*  --   --   --  >200*  --    < > 66*  --  49*  --  76*  LABPROT 23.0*  --   --   --   --   --   --   --   --   --   --   --   INR 2.0*  --   --   --   --   --   --   --   --   --   --   --   HEPARINUNFRC  --   --   --  >1.10* >1.10*  --   --  0.44  --   --   --   --   CREATININE  --   --  1.76*  --   --  2.39*  --  1.80*  --   --   --   --   CKMB  --  19.0*  --   --   --   --   --   --   --   --   --   --   TROPONINIHS 482*  --  1,118*  --   --   --   --   --  540*  --   --   --    < > = values in this interval not displayed.   Estimated Creatinine Clearance: 27.8 mL/min (A) (by C-G formula based on SCr of 1.8 mg/dL (H)).  Medical History: Past Medical History:  Diagnosis Date   Atrial fibrillation (HCC)    COPD (chronic obstructive pulmonary disease) (HCC)    Hypertension    Near syncope    Tremors of nervous system    Assessment: Carlos Richard is a 89 y.o. male presenting with STEMI. PMH significant for essential tremor, BPH, COPD, AF, HLD, CKD, HTN. Patient was on [facility] PTA per chart review. From chart review, patient takes Xarelto 15 mg daily at home. Per  patient's wife, last dose was taken 3/7 afternoon.  Pharmacy has been consulted to initiate and manage heparin infusion.   Baseline Labs: aPTT 124, PT 23.0, INR 2.0, Hgb 13.3, Hct 41.9, Plt 215   Goal of Therapy:  Heparin level 0.3-0.7 units/ml aPTT 66-102 seconds Monitor platelets by anticoagulation protocol: Yes  Date Time aPTT/HL Rate/Comment  3/9 2347 >  200/>1.10 12050/supratherapeutic 3/9 1039 199/---  850/supratherapeutic 3/9 2041 105/---  650/supratherapeutic 3/10 0450 66 / 0.44 550/therapeutic x 1 3/10 1229 49/---  550/SUBtherapeutic  3/10     2249    76                   750/Therapeutic X 1   Plan:  3/10:  aPTT @ 2249 = 76, therapeutic X 1 - will continue pt on current rate and recheck aPTT and HL in 8 hrs on 3/11 @ 0700.  Continue to monitor aPTT until HL and aPTT correlate.  Check HL daily for correlation until HL and aPTT correlate. Switch to HL monitoring once HL and aPTT correlate.  Continue to monitor H&H and platelets daily while on heparin infusion    Thank you allowing pharmacy to participate in this patient's care.   Xitlaly Ault D, PharmD 01/23/2024 11:20 PM

## 2024-01-23 NOTE — Procedures (Signed)
 Patient Name: DAWSYN RAMSARAN  MRN: [id number]  Epilepsy Attending: Charlsie Quest  Referring Physician/Provider: Jimmye Norman, NP  Date: 01/23/2024 Duration: 21.39 mins  Patient history: 89yo M s/p cardiac arrest. EEG to evaluate for seizure  Level of alertness:  comatose/ lethargic   AEDs during EEG study: Propofol  Technical aspects: This EEG study was done with scalp electrodes positioned according to the 10-20 International system of electrode placement. Electrical activity was reviewed with band pass filter of 1-70Hz , sensitivity of 7 uV/mm, display speed of 81mm/sec with a 60Hz  notched filter applied as appropriate. EEG data were recorded continuously and digitally stored.  Video monitoring was available and reviewed as appropriate.  Description: EEG showed continuous generalized 3 to 6 Hz theta-delta slowing admixed with 12-14hz  beta activity. Hyperventilation and photic stimulation were not performed.     ABNORMALITY - Continuous slow, generalized  IMPRESSION: This study is suggestive of severe diffuse encephalopathy. No seizures or epileptiform discharges were seen throughout the recording.  Tad Fancher Annabelle Harman

## 2024-01-23 NOTE — Progress Notes (Signed)
 Patient ID: Carlos Richard, male   DOB: November 22, 1931, 89 y.o.   MRN: [id number] [facility]    SUBJECTIVE: Intubated sedated   Vitals:   01/23/24 1445 01/23/24 1500 01/23/24 1515 01/23/24 1530  BP: (!) 109/56 (!) 105/49 (!) 111/54 (!) 119/57  Pulse: 61 (!) 59 (!) 59 (!) 59  Resp: (!) 21 15 13  (!) 22  Temp:      TempSrc:      SpO2: 99% 98% 99% 99%  Weight:      Height:         Intake/Output Summary (Last 24 hours) at 01/23/2024 1614 Last data filed at 01/23/2024 1500 Gross per 24 hour  Intake 3445.68 ml  Output 600 ml  Net 2845.68 ml      PHYSICAL EXAM  General: Well developed, well nourished, in no acute distress HEENT:  Normocephalic and atramatic Neck:  No JVD.  Lungs: Clear bilaterally to auscultation and percussion. Heart: HRRR . Normal S1 and S2 without gallops or murmurs.  Abdomen: Bowel sounds are positive, abdomen soft and non-tender  Msk:  Back normal, normal gait. Normal strength and tone for age. Extremities: No clubbing, cyanosis or edema.   Neuro: Alert and oriented X 3. Psych:  Good affect, responds appropriately   LABS: Basic Metabolic Panel: Recent Labs    01/22/24 0403 01/23/24 0450  NA 141 140  K 4.4 3.6  CL 107 107  CO2 21* 26  GLUCOSE 173* 173*  BUN 24* 27*  CREATININE 2.39* 1.80*  CALCIUM 8.3* 7.7*  MG 2.8* 2.5*  PHOS 5.8* 3.2   Liver Function Tests: Recent Labs    01/21/24 0350 01/21/24 1258  AST 105* 146*  ALT 104* 118*  ALKPHOS 57 57  BILITOT 0.4 1.1  PROT 5.4* 5.3*  ALBUMIN 2.9* 2.9*   No results for input(s): "LIPASE", "AMYLASE" in the last 72 hours. CBC: Recent Labs    01/22/24 0403 01/23/24 0450  WBC 22.9* 11.5*  HGB 13.2 10.2*  HCT 39.2 31.5*  MCV 92.9 95.5  PLT 188 104*   Cardiac Enzymes: Recent Labs    01/21/24 0904  CKMB 19.0*   BNP: Invalid input(s): "POCBNP" D-Dimer: No results for input(s): "DDIMER" in the last 72 hours. Hemoglobin A1C: Recent Labs    01/21/24 0609  HGBA1C 5.7*    Fasting Lipid Panel: No results for input(s): "CHOL", "HDL", "LDLCALC", "TRIG", "CHOLHDL", "LDLDIRECT" in the last 72 hours. Thyroid Function Tests: Recent Labs    01/21/24 0350  TSH 2.726   Anemia Panel: No results for input(s): "VITAMINB12", "FOLATE", "FERRITIN", "TIBC", "IRON", "RETICCTPCT" in the last 72 hours.  DG Chest Port 1 View Result Date: 01/23/2024 CLINICAL DATA:  Evaluate enteric tube placement. EXAM: PORTABLE CHEST 1 VIEW COMPARISON:  01/23/2024 FINDINGS: Interval placement of enteric tube. The tip of the tube is below the GE junction in the expected location of the gastric fundus. Right lower lung opacities are again noted and appears similar to the exam from earlier today. IMPRESSION: Enteric tube tip is below the GE junction in the expected location of the gastric fundus. Electronically Signed   By: Signa Kell M.D.   On: 01/23/2024 11:42   DG Chest 1 View Result Date: 01/23/2024 CLINICAL DATA:  Shortness breath.  Support. EXAM: CHEST  1 VIEW COMPARISON:  01/21/2024 FINDINGS: Heel endotracheal tube tip 4 cm above the carina. Right internal jugular central line tip in the SVC above the right atrium. No nasogastric tube. Left lung remains largely clear. Slight improvement  in previously seen right lung pneumonia. No worsening or new finding. IMPRESSION: 1. Endotracheal tube tip 4 cm above the carina. Right internal jugular central line tip in the SVC above the right atrium. 2. Slight improvement in previously seen right lung pneumonia. Electronically Signed   By: Paulina Fusi M.D.   On: 01/23/2024 07:00     Echo preserved left ventricular function  TELEMETRY: Sinus rhythm rate of 90:  ASSESSMENT AND PLAN:  Principal Problem:   STEMI (ST elevation myocardial infarction) (HCC) Active Problems:   Cardiac arrest (HCC)   Closed fracture of right hip (HCC)   Shock (HCC)   Aspiration pneumonia of both lungs (HCC) Atrial fibrillation paroxysmal Respiratory  failure  Plan Continue supportive care for respiratory failure continue vent management wean when appropriate  No evidence of ischemia cardiac cath with no obstructive disease Demand ischemia with elevated troponin related to medical presentation and event  Renal insufficiency stage IV maintain adequate hydration consider nephrology input  Pneumonia appears to be possible aspiration continue broad-spectrum antibiotic therapy as well as ventilatory support with inhalers supplemental oxygen  Evidence of STEMI or ACS continue conservative therapy okay to continue heparin for A-fib DVT prophylaxis Continue conservative cardiac management   Clotilde Dieter, DO 01/23/2024 4:14 PM

## 2024-01-23 NOTE — Progress Notes (Signed)
 Pt's HR started going into the 40s and as low as the 30s around 0300, BP was 125/58 (78), O2 was 97% and resp was 16. precedex was at 0.5 and fentanyl at , precedex was turned off and fentanyl was held. NP was notified and verbal was to stop precedex and hold fentanyl, HR was back into the 60s. About 15 mins later pt woke up combative while suctioning through is indwelling catheter, HR in the 90s. Pt was swinging at staff and biting down his ET tube while also attempting to bite staff's hand. NP notified again and fentanyl gtt restarted at 75 mcg and 2mg  versed push given. Pt currently resting and calm, precedex remains off, HR in the 60s, O2 95%, and BP 117/610(79)

## 2024-01-23 NOTE — Progress Notes (Signed)
 Pt's HR is in the 120s and converted to A-fib, NP notified and EKG was exported, pt also destating into the 80s and dyssynchronous with the vent, prn 2mg  Versed was given with no relief, Respiratory at bedside and pt was suctioned through ET tube, copious amount of pink tinged secretions. Pt's HR now goes up to the 130s and still remains dyssynchronous. CXR ordered.

## 2024-01-23 NOTE — Progress Notes (Signed)
 Initial Nutrition Assessment  DOCUMENTATION CODES:   Non-severe (moderate) malnutrition in context of chronic illness  INTERVENTION:   -TF via OGT:   Initiate Vital AF 1.2 @ 20 ml/hr and increase by 10 ml every 8 hours to goal rate of 60 ml/hr.   30 ml free water flush every 4 hours    Tube feeding regimen provides 1728 kcal (100% of needs), 108 grams of protein, and 1168 ml of H2O. Total free water: 1348 ml daily  -Monitor Mg, K, and Phos and replete as needed secondary to high refeeding risk  -MVI with minerals daily -100 mg thiamine daily x 7 days  NUTRITION DIAGNOSIS:   Moderate Malnutrition related to chronic illness (COPD) as evidenced by mild fat depletion, moderate fat depletion, mild muscle depletion, moderate muscle depletion.  GOAL:   Patient will meet greater than or equal to 90% of their needs  MONITOR:   Vent status, TF tolerance  REASON FOR ASSESSMENT:   Consult, Ventilator Enteral/tube feeding initiation and management  ASSESSMENT:   Pt with significant PMH of atrial fibrillation with slow ventricular response, B12 deficiency, HTN, HLD, COPD, essential tremors (ET), left thalamic DBS stimulator for ET who presented as a code STEMI.  Pt admitted with acute metabolic encephalopathy, subacute rt femur fracture, and cardiac arrest.   Patient is currently intubated on ventilator support. OGT placed; per x-ray on 01/23/24, revealed tip of tube in stomach.  MV: 6.1 L/min Temp (24hrs), Avg:99.1 F (37.3 C), Min:97.9 F (36.6 C), Max:100.2 F (37.9 C)  Propofol: 10.7 ml/hr (provides 282 kcals daily)  Reviewed I/O's: +2.6 L x 24 hours and +6.4 L since admission  UOP: 500 ml x 24 hours  Case discussed with RN, who reports plan to try to extubated x 2 days, however, unsuccessful due to respiratory concerns. OGT was placed today. Per PCCM, plan to start fees today.   No wt loss noted over the past 6 months.   Medications reviewed and include protonix,  propofol, pitressin, and levophed.   Labs reviewed: CBGS: 96-152 (inpatient orders for glycemic control are 0-15 units insulin aspart every 4 hours).    NUTRITION - FOCUSED PHYSICAL EXAM:  Flowsheet Row Most Recent Value  Orbital Region Moderate depletion  Upper Arm Region No depletion  Thoracic and Lumbar Region No depletion  Buccal Region Mild depletion  Temple Region Severe depletion  Clavicle Bone Region No depletion  Clavicle and Acromion Bone Region No depletion  Scapular Bone Region No depletion  Dorsal Hand Mild depletion  Patellar Region Mild depletion  Anterior Thigh Region Mild depletion  Posterior Calf Region Mild depletion  Edema (RD Assessment) None  Hair Reviewed  Eyes Reviewed  Mouth Reviewed  Skin Reviewed  Nails Reviewed       Diet Order:   Diet Order     None       EDUCATION NEEDS:   No education needs have been identified at this time  Skin:  Skin Assessment: Reviewed RN Assessment  Last BM:  01/23/24 (type 3)  Height:   Ht Readings from Last 1 Encounters:  01/21/24 5' 9.02" (1.753 m)    Weight:   Wt Readings from Last 1 Encounters:  01/23/24 77.5 kg    Ideal Body Weight:  72.7 kg  BMI:  Body mass index is 25.22 kg/m.  Estimated Nutritional Needs:   Kcal:  1686  Protein:  95-110 grams  Fluid:  1.6-1.8 L    Levada Schilling, RD, LDN, CDCES Registered Dietitian III Certified  Diabetes Care and Education Specialist If unable to reach this RD, please use "RD Inpatient" group chat on secure chat between hours of 8am-4 pm daily

## 2024-01-24 ENCOUNTER — Inpatient Hospital Stay

## 2024-01-24 DIAGNOSIS — S72001A Fracture of unspecified part of neck of right femur, initial encounter for closed fracture: Secondary | ICD-10-CM | POA: Diagnosis not present

## 2024-01-24 DIAGNOSIS — R6521 Severe sepsis with septic shock: Secondary | ICD-10-CM | POA: Diagnosis not present

## 2024-01-24 DIAGNOSIS — G9341 Metabolic encephalopathy: Secondary | ICD-10-CM | POA: Diagnosis not present

## 2024-01-24 DIAGNOSIS — A419 Sepsis, unspecified organism: Secondary | ICD-10-CM | POA: Diagnosis not present

## 2024-01-24 LAB — APTT: aPTT: 69 s — ABNORMAL HIGH (ref 24–36)

## 2024-01-24 LAB — LEGIONELLA PNEUMOPHILA SEROGP 1 UR AG: L. pneumophila Serogp 1 Ur Ag: NEGATIVE

## 2024-01-24 LAB — MAGNESIUM
Magnesium: 2.7 mg/dL — ABNORMAL HIGH (ref 1.7–2.4)
Magnesium: 2.5 mg/dL — ABNORMAL HIGH (ref 1.7–2.4)

## 2024-01-24 LAB — BASIC METABOLIC PANEL
Anion gap: 8 (ref 5–15)
BUN: 29 mg/dL — ABNORMAL HIGH (ref 8–23)
CO2: 25 mmol/L (ref 22–32)
Calcium: 7.7 mg/dL — ABNORMAL LOW (ref 8.9–10.3)
Chloride: 106 mmol/L (ref 98–111)
Creatinine, Ser: 1.88 mg/dL — ABNORMAL HIGH (ref 0.61–1.24)
GFR, Estimated: 34 mL/min — ABNORMAL LOW (ref >60)
Glucose, Bld: 148 mg/dL — ABNORMAL HIGH (ref 70–99)
Potassium: 3.5 mmol/L (ref 3.5–5.1)
Sodium: 139 mmol/L (ref 135–145)

## 2024-01-24 LAB — PHOSPHORUS
Phosphorus: 3.3 mg/dL (ref 2.5–4.6)
Phosphorus: 2.5 mg/dL (ref 2.5–4.6)

## 2024-01-24 LAB — CBC
HCT: 31.6 % — ABNORMAL LOW (ref 39.0–52.0)
Hemoglobin: 10.2 g/dL — ABNORMAL LOW (ref 13.0–17.0)
MCH: 31.4 pg (ref 26.0–34.0)
MCHC: 32.3 g/dL (ref 30.0–36.0)
MCV: 97.2 fL (ref 80.0–100.0)
Platelets: 116 10*3/uL — ABNORMAL LOW (ref 150–400)
RBC: 3.25 MIL/uL — ABNORMAL LOW (ref 4.22–5.81)
RDW: 14.2 % (ref 11.5–15.5)
WBC: 13.4 10*3/uL — ABNORMAL HIGH (ref 4.0–10.5)
nRBC: 0.1 % (ref 0.0–0.2)

## 2024-01-24 LAB — CULTURE, RESPIRATORY W GRAM STAIN

## 2024-01-24 LAB — GLUCOSE, CAPILLARY
Glucose-Capillary: 108 mg/dL — ABNORMAL HIGH (ref 70–99)
Glucose-Capillary: 110 mg/dL — ABNORMAL HIGH (ref 70–99)
Glucose-Capillary: 118 mg/dL — ABNORMAL HIGH (ref 70–99)
Glucose-Capillary: 129 mg/dL — ABNORMAL HIGH (ref 70–99)
Glucose-Capillary: 131 mg/dL — ABNORMAL HIGH (ref 70–99)
Glucose-Capillary: 165 mg/dL — ABNORMAL HIGH (ref 70–99)

## 2024-01-24 LAB — LIPOPROTEIN A (LPA): Lipoprotein (a): <8.4 nmol/L (ref <75.0)

## 2024-01-24 LAB — HEPARIN LEVEL (UNFRACTIONATED): Heparin Unfractionated: 0.34 [IU]/mL (ref 0.30–0.70)

## 2024-01-24 MED ORDER — DOCUSATE SODIUM 50 MG/5ML PO LIQD: 100.0000 mg | Freq: Two times a day (BID) | ORAL | Status: DC | PRN

## 2024-01-24 MED ORDER — ROCURONIUM BROMIDE 10 MG/ML (PF) SYRINGE
PREFILLED_SYRINGE | INTRAVENOUS | Status: AC
  Administered 2024-01-24: 60 mg via INTRAVENOUS
  Filled 2024-01-24: qty 10

## 2024-01-24 MED ORDER — POTASSIUM CHLORIDE 10 MEQ/100ML IV SOLN
10.0000 meq | INTRAVENOUS | Status: AC
  Administered 2024-01-24 (×4): 10 meq via INTRAVENOUS
  Filled 2024-01-24 (×4): qty 100

## 2024-01-24 MED ORDER — POLYETHYLENE GLYCOL 3350 17 G PO PACK: 17.0000 g | PACK | Freq: Every day | ORAL | Status: DC | PRN

## 2024-01-24 MED ORDER — ROCURONIUM BROMIDE 10 MG/ML (PF) SYRINGE: 60.0000 mg | PREFILLED_SYRINGE | Freq: Once | INTRAVENOUS | Status: AC

## 2024-01-24 MED ORDER — CHLORHEXIDINE GLUCONATE CLOTH 2 % EX PADS
6.0000 | MEDICATED_PAD | Freq: Every day | CUTANEOUS | Status: DC
  Administered 2024-01-24 – 2024-01-26 (×2): 6 via TOPICAL

## 2024-01-24 NOTE — Consult Note (Signed)
 PHARMACY CONSULT NOTE - ELECTROLYTES  Pharmacy Consult for Electrolyte Monitoring and Replacement   Recent Labs: Height: 5' 9.02" (175.3 cm) Weight: 79.6 kg (175 lb 7.8 oz) IBW/kg (Calculated) : 70.74 Estimated Creatinine Clearance: 26.6 mL/min (A) (by C-G formula based on SCr of 1.88 mg/dL (H)). Potassium (mmol/L)  Date Value  01/24/2024 3.5  11/13/2013 3.6   Magnesium (mg/dL)  Date Value  40/98/1191 2.7 (H)   Calcium (mg/dL)  Date Value  47/82/9562 7.7 (L)   Calcium, Total (mg/dL)  Date Value  13/06/6577 8.8   Albumin (g/dL)  Date Value  46/96/2952 2.9 (L)  09/20/2023 4.2  11/13/2013 3.8   Phosphorus (mg/dL)  Date Value  84/13/2440 3.3   Sodium (mmol/L)  Date Value  01/24/2024 139  10/31/2023 145 (H)  11/13/2013 141    Assessment  Carlos Richard is a 89 y.o. male presenting with chest pain. PMH significant for atrial fibrillation with slow ventricular response, B12 deficiency, HTN, HLD, COPD, essential tremors (ET), left thalamic DBS stimulator. Pharmacy has been consulted to monitor and replace electrolytes.  MIVF: Free water @ 30 mL/hr  Nutrition: Vital 1.2 @ 60 mL/hr  Goal of Therapy: Electrolytes WNL (K > 4 and Mg >2  due to afib) Corrected Ca: 8.6 mg/dL   Plan:  Give 10 mEq of Kcl IV x 4 Renal function stable, but above previous baseline.  Increase replacement from yesterday to reach the goal of K > 4 Continue to monitor electrolytes with AM labs   Thank you for allowing pharmacy to be a part of this patient's care.  Effie Shy, PharmD Pharmacy Resident  01/24/2024 6:11 AM

## 2024-01-24 NOTE — Plan of Care (Signed)
  Problem: Clinical Measurements: Goal: Will remain free from infection Outcome: Progressing   Problem: Education: Goal: Knowledge of General Education information will improve Description: Including pain rating scale, medication(s)/side effects and non-pharmacologic comfort measures Outcome: Not Progressing   Problem: Clinical Measurements: Goal: Ability to maintain clinical measurements within normal limits will improve Outcome: Not Progressing Goal: Diagnostic test results will improve Outcome: Not Progressing Goal: Respiratory complications will improve Outcome: Not Progressing Goal: Cardiovascular complication will be avoided Outcome: Not Progressing   Problem: Activity: Goal: Risk for activity intolerance will decrease Outcome: Not Progressing

## 2024-01-24 NOTE — TOC Progression Note (Signed)
 Transition of Care [facility]) - Progression Note    Patient Details  Name: Carlos Richard MRN: [id number] Date of Birth: 1931-12-17  [facility] CM/SW Contact  Margarito Liner, LCSW Phone Number: 01/24/2024, 1:35 PM  Clinical Narrative:  CSW acknowledges SNF consult. Patient is currently intubated. CSW will clear consult. Please consult PT and OT when medically appropriate so patient can be evaluated.   Expected Discharge Plan:  (TBD) Barriers to Discharge: No Barriers Identified  Expected Discharge Plan and Services   Discharge Planning Services: CM Consult   Living arrangements for the past 2 months: Single Family Home                                       Social Determinants of Health (SDOH) Interventions SDOH Screenings   Food Insecurity: No Food Insecurity (01/21/2024)  Housing: Low Risk  (01/21/2024)  Transportation Needs: No Transportation Needs (01/21/2024)  Utilities: Not At Risk (01/21/2024)  Alcohol Screen: Low Risk  (03/08/2023)  Depression (PHQ2-9): Low Risk  (10/31/2023)  Financial Resource Strain: Low Risk  (03/08/2023)  Physical Activity: Insufficiently Active (03/08/2023)  Social Connections: Unknown (01/21/2024)  Stress: No Stress Concern Present (03/08/2023)  Tobacco Use: Medium Risk (12/30/2023)    Readmission Risk Interventions     No data to display

## 2024-01-24 NOTE — Progress Notes (Addendum)
 Patient ID: Carlos Richard, male   DOB: [birth year], 89 y.o.   MRN: [id number] [facility]    SUBJECTIVE: Intubated sedated   Vitals:   01/24/24 1300 01/24/24 1315 01/24/24 1330 01/24/24 1345  BP: (!) 97/50 (!) 100/50 (!) 103/49 (!) 102/49  Pulse: (!) 58     Resp: (!) 21     Temp:      TempSrc:      SpO2: 97%     Weight:      Height:         Intake/Output Summary (Last 24 hours) at 01/24/2024 1406 Last data filed at 01/24/2024 1200 Gross per 24 hour  Intake 3720.27 ml  Output 1245 ml  Net 2475.27 ml      PHYSICAL EXAM  General: Well developed, well nourished, in no acute distress HEENT:  Normocephalic and atramatic Neck:  No JVD.  Lungs: Clear bilaterally to auscultation and percussion. Heart: HRRR . Normal S1 and S2 without gallops or murmurs.  Abdomen: Bowel sounds are positive, abdomen soft and non-tender  Msk:  Back normal, normal gait. Normal strength and tone for age. Extremities: No clubbing, cyanosis or edema.   Neuro: Intubated, sedated   LABS: Basic Metabolic Panel: Recent Labs    01/23/24 0450 01/23/24 1655 01/24/24 0350  NA 140  --  139  K 3.6  --  3.5  CL 107  --  106  CO2 26  --  25  GLUCOSE 173*  --  148*  BUN 27*  --  29*  CREATININE 1.80*  --  1.88*  CALCIUM 7.7*  --  7.7*  MG 2.5* 2.5* 2.7*  PHOS 3.2 3.4 3.3   Liver Function Tests: No results for input(s): "AST", "ALT", "ALKPHOS", "BILITOT", "PROT", "ALBUMIN" in the last 72 hours.  No results for input(s): "LIPASE", "AMYLASE" in the last 72 hours. CBC: Recent Labs    01/23/24 0450 01/23/24 1624 01/24/24 0350  WBC 11.5*  --  13.4*  HGB 10.2* 10.6* 10.2*  HCT 31.5* 32.3* 31.6*  MCV 95.5  --  97.2  PLT 104*  --  116*   Cardiac Enzymes: No results for input(s): "CKTOTAL", "CKMB", "CKMBINDEX", "TROPONINI" in the last 72 hours.  BNP: Invalid input(s): "POCBNP" D-Dimer: No results for input(s): "DDIMER" in the last 72 hours. Hemoglobin A1C: No results for input(s):  "HGBA1C" in the last 72 hours.  Fasting Lipid Panel: No results for input(s): "CHOL", "HDL", "LDLCALC", "TRIG", "CHOLHDL", "LDLDIRECT" in the last 72 hours. Thyroid Function Tests: No results for input(s): "TSH", "T4TOTAL", "T3FREE", "THYROIDAB" in the last 72 hours.  Invalid input(s): "FREET3"  Anemia Panel: No results for input(s): "VITAMINB12", "FOLATE", "FERRITIN", "TIBC", "IRON", "RETICCTPCT" in the last 72 hours.  DG Chest Port 1 View Result Date: 01/23/2024 CLINICAL DATA:  Evaluate enteric tube placement. EXAM: PORTABLE CHEST 1 VIEW COMPARISON:  01/23/2024 FINDINGS: Interval placement of enteric tube. The tip of the tube is below the GE junction in the expected location of the gastric fundus. Right lower lung opacities are again noted and appears similar to the exam from earlier today. IMPRESSION: Enteric tube tip is below the GE junction in the expected location of the gastric fundus. Electronically Signed   By: Signa Kell M.D.   On: 01/23/2024 11:42   DG Chest 1 View Result Date: 01/23/2024 CLINICAL DATA:  Shortness breath.  Support. EXAM: CHEST  1 VIEW COMPARISON:  01/21/2024 FINDINGS: Heel endotracheal tube tip 4 cm above the carina. Right internal jugular central line tip  in the SVC above the right atrium. No nasogastric tube. Left lung remains largely clear. Slight improvement in previously seen right lung pneumonia. No worsening or new finding. IMPRESSION: 1. Endotracheal tube tip 4 cm above the carina. Right internal jugular central line tip in the SVC above the right atrium. 2. Slight improvement in previously seen right lung pneumonia. Electronically Signed   By: Paulina Fusi M.D.   On: 01/23/2024 07:00   Echo preserved left ventricular function  TELEMETRY: Sinus rhythm rate 50s  ASSESSMENT AND PLAN:  Principal Problem:   STEMI (ST elevation myocardial infarction) (HCC) Active Problems:   Cardiac arrest (HCC)   Closed fracture of right hip (HCC)   Septic shock  (HCC)   Aspiration pneumonia of both lungs (HCC)   Malnutrition of moderate degree Atrial fibrillation paroxysmal Respiratory failure  Plan Continue supportive care for respiratory failure continue vent management wean when appropriate  No evidence of ischemia cardiac cath with no obstructive disease Demand ischemia with elevated troponin related to medical presentation and event  Renal insufficiency stage IV maintain adequate hydration consider nephrology input  Pneumonia appears to be possible aspiration continue broad-spectrum antibiotic therapy as well as ventilatory support with inhalers supplemental oxygen  Evidence of STEMI or ACS continue conservative therapy okay to continue heparin for A-fib DVT prophylaxis  Cardiology will sign off. Please haiku with questions or re-engage if needed.    This patient's plan of care was discussed and created with Dr. Melton Alar and she is in agreement.    SignedGale Journey, PA-C  01/24/2024 2:06 PM

## 2024-01-24 NOTE — Procedures (Signed)
 Tube Exchange.  Carlos Richard  [id number]  1932-04-29  Date:01/24/24  Time:5:55 PM   Provider Performing:Jean-Pierre Jaycen Vercher   Procedure: Intubation (31500)  Indication(s) Respiratory Failure  Consent Risks of the procedure as well as the alternatives and risks of each were explained to the patient and/or caregiver.  Consent for the procedure was obtained and is signed in the bedside chart  Anesthesia Versed  Time Out Verified patient identification, verified procedure, site/side was marked, verified correct patient position, special equipment/implants available, medications/allergies/relevant history reviewed, required imaging and test results available.  Sterile Technique Usual hand hygeine, masks, and gloves were used  Procedure Description Patient positioned in bed supine.  Sedation given as noted above.  Patient was intubated with endotracheal tube using  a bougie the existing tube was removed and a new one placed 25 at the lip.  Number of attempts was 1.  Colorimetric CO2 detector was consistent with tracheal placement.  Complications/Tolerance None; patient tolerated the procedure well. Chest X-ray is ordered to verify placement.  EBL Minimal  Specimen(s) None   Janann Colonel, MD [facility] 01/24/2024 5:56 PM

## 2024-01-24 NOTE — IPAL (Signed)
  Interdisciplinary Goals of Care Family Meeting   Date carried out: 01/24/2024  Location of the meeting: Conference room  Member's involved: Physician and Family Member or next of kin  Durable Power of Attorney or acting medical decision maker: Wife Carlos Richard and both his sons.     Discussion: We discussed goals of care for Carlos Richard .  And relayed the ongoing clinical picture with septic shock and acute hypoxic respiratory failure with minimal mentation off sedation. They understand the severity of the situation and most likely will necessitate a prolonged recovery course as well as life long increased dependency. We have agreed to give him a time trial for 48 hours and should he fail to be extubated then we will proceed with comfort measures only.   Code status:   Code Status: Limited: Do not attempt resuscitation (DNR) -DNR-LIMITED -Do Not Intubate/DNI    Disposition: Continue current acute care  Time spent for the meeting: 15    Carlos Colonel, MD  01/24/2024, 1:35 PM

## 2024-01-24 NOTE — Consult Note (Signed)
 PHARMACY - ANTICOAGULATION CONSULT NOTE  Pharmacy Consult for Heparin Infusion Indication: atrial fibrillation  No Known Allergies  Patient Measurements: Height: 5' 9.02" (175.3 cm) Weight: 79.6 kg (175 lb 7.8 oz) IBW/kg (Calculated) : 70.74 Heparin Dosing Weight: 71.2 kg   Vital Signs: Temp: 98.7 F (37.1 C) (03/11 0354) Temp Source: Axillary (03/11 0354) BP: 99/52 (03/11 0600) Pulse Rate: 57 (03/11 0600)  Labs: Recent Labs     0000 01/21/24 0609 01/21/24 0904 01/21/24 1258 01/21/24 1658 01/21/24 2347 01/22/24 0403 01/22/24 1039 01/23/24 0450 01/23/24 0757 01/23/24 1229 01/23/24 1624 01/23/24 2249 01/24/24 0350 01/24/24 0617  HGB   < >  --   --  13.2  --   --  13.2  --  10.2*  --   --  10.6*  --  10.2*  --   HCT   < >  --   --  39.3  --   --  39.2  --  31.5*  --   --  32.3*  --  31.6*  --   PLT   < >  --   --  196  --   --  188  --  104*  --   --   --   --  116*  --   APTT  --  124*  --   --   --  >200*  --    < > 66*  --  49*  --  76*  --  69*  LABPROT  --  23.0*  --   --   --   --   --   --   --   --   --   --   --   --   --   INR  --  2.0*  --   --   --   --   --   --   --   --   --   --   --   --   --   HEPARINUNFRC  --   --   --   --    < > >1.10*  --   --  0.44  --   --   --   --   --  0.34  CREATININE   < >  --   --  1.76*  --   --  2.39*  --  1.80*  --   --   --   --  1.88*  --   CKMB  --   --  19.0*  --   --   --   --   --   --   --   --   --   --   --   --   TROPONINIHS  --  482*  --  1,118*  --   --   --   --   --  540*  --   --   --   --   --    < > = values in this interval not displayed.   Estimated Creatinine Clearance: 26.6 mL/min (A) (by C-G formula based on SCr of 1.88 mg/dL (H)).  Medical History: Past Medical History:  Diagnosis Date   Atrial fibrillation (HCC)    COPD (chronic obstructive pulmonary disease) (HCC)    Hypertension    Near syncope    Tremors of nervous system    Assessment: Carlos Richard is a 89 y.o. male presenting  with STEMI. PMH significant  for essential tremor, BPH, COPD, AF, HLD, CKD, HTN. Patient was on [facility] PTA per chart review. From chart review, patient takes Xarelto 15 mg daily at home. Per patient's wife, last dose was taken 3/7 afternoon.  Pharmacy has been consulted to initiate and manage heparin infusion.   Baseline Labs: aPTT 124, PT 23.0, INR 2.0, Hgb 13.3, Hct 41.9, Plt 215   Goal of Therapy:  Heparin level 0.3-0.7 units/ml aPTT 66-102 seconds Monitor platelets by anticoagulation protocol: Yes  Date Time aPTT/HL Rate/Comment  3/9 2347 >200/>1.10 12050/supratherapeutic 3/9 1039 199/---  850/supratherapeutic 3/9 2041 105/---  650/supratherapeutic 3/10 0450 66 / 0.44 550/therapeutic x 1 3/10 1229 49/---  550/SUBtherapeutic  3/10     2249    76                   750/Therapeutic X 1  3/11     0617    69 / 0.34         750/Therapeutic X 2   Plan:  3/11 @ 0617:  aPTT = 69,  HL = 0.34 - aPTT therapeutic X 2, now correlating with HL  - Will use HL to guide dosing from here on  - Will continue pt on current rate and recheck HL on 3/12 with AM labs.  Continue to monitor H&H and platelets daily while on heparin infusion    Thank you allowing pharmacy to participate in this patient's care.   Blayze Haen D, PharmD 01/24/2024 7:02 AM

## 2024-01-24 NOTE — Progress Notes (Signed)
 Discussed patient with Dr. Sula Soda earlier today. Plan is to monitor patient over next 36 hours for meaningful clinical recovery. If patient is not doing well, family was in agreement to make patient comfort care. If patient did show signs of appropriate recovery, we could then consider R hip hemiarthroplasty for femoral neck fracture. Can plan to re-assess evening of 01/25/24 for possible surgery the following day on 02/09/2024. Can also certainly wait longer if needed.

## 2024-01-24 NOTE — Plan of Care (Signed)
  Problem: Clinical Measurements: Goal: Ability to maintain clinical measurements within normal limits will improve 01/24/2024 1657 by Joesph July, RN Outcome: Progressing 01/24/2024 1654 by Joesph July, RN Outcome: Not Progressing Goal: Will remain free from infection Outcome: Progressing Goal: Respiratory complications will improve Outcome: Progressing Goal: Cardiovascular complication will be avoided 01/24/2024 1657 by Joesph July, RN Outcome: Progressing 01/24/2024 1654 by Joesph July, RN Outcome: Progressing   Problem: Nutrition: Goal: Adequate nutrition will be maintained 01/24/2024 1657 by Joesph July, RN Outcome: Progressing 01/24/2024 1654 by Joesph July, RN Outcome: Progressing   Problem: Pain Managment: Goal: General experience of comfort will improve and/or be controlled 01/24/2024 1657 by Joesph July, RN Outcome: Progressing 01/24/2024 1654 by Joesph July, RN Outcome: Progressing   Problem: Safety: Goal: Ability to remain free from injury will improve Outcome: Progressing   Problem: Fluid Volume: Goal: Ability to maintain a balanced intake and output will improve Outcome: Progressing   Problem: Metabolic: Goal: Ability to maintain appropriate glucose levels will improve Outcome: Progressing   Problem: Nutritional: Goal: Maintenance of adequate nutrition will improve Outcome: Progressing   Problem: Education: Goal: Knowledge of General Education information will improve Description: Including pain rating scale, medication(s)/side effects and non-pharmacologic comfort measures 01/24/2024 1657 by Joesph July, RN Outcome: Not Progressing 01/24/2024 1654 by Joesph July, RN Outcome: Not Progressing

## 2024-01-24 NOTE — Plan of Care (Signed)
                                                     Palliative Care Progress Note   Patient Name: Carlos Richard       Date: 01/24/2024 DOB: 01-18-1932  Age: 89 y.o. MRN#: [id number] Attending Physician: Janann Colonel, MD Primary Care Physician: Dorcas Carrow, DO Admit Date: 01/21/2024  Extensive chart review completed including labs, vital signs, imaging, progress notes, orders, and available advanced directive documents from current and previous encounters.   Patient assessed and unable to participate in goals of care or medical decision making independently at this time.    Counseled with CCM.  After assessing the patient and discussing his case with medical team, I spoke with patient's wife over the phone.  She has met with other providers earlier today and has returned home to rest.  She would like to speak in person tomorrow.  We plan to meet bedside at 10 AM.  Complete consult note and details of goals of care discussions to follow once meeting occurs.  Thank you for allowing the Palliative Medicine Team to assist in the care of Carlos Richard.  Samara Deist L. Bonita Quin, DNP, FNP-BC Palliative Medicine Team

## 2024-01-24 NOTE — Progress Notes (Signed)
 NAME:  Carlos Richard, MRN:  [id number], DOB:  10/15/1932, LOS: 3 ADMISSION DATE:  01/21/2024, CHIEF COMPLAINT: Respiratory failure and Cardiac Arrest    History of Present Illness:   89 y.o male with significant PMH of atrial fibrillation with slow ventricular response, B12 deficiency, HTN, HLD, COPD, essential tremors (ET), left thalamic DBS stimulator for ET who presented to the ED as a code STEMI.   Per ED reports, patient was sleeping when he woke up screaming.  Patient's wife found him with agonal respiration and started CPR although unclear whether he had a pulse or not.  EMS was called and on their arrival patient was found to be hypotensive, altered with agonal respirations.  Levophed was started with slight improvement in his blood pressure.  Initial EKG concerning for ST elevation in V1 through V3 therefore code STEMI called en route to the ED.   ED Course: Initial vital signs showed HR of beats/minute, BP mm Hg, the RR 30 breaths/minute, and the oxygen saturation % on and a temperature of 98.56F (36.9C).  He was awake somnolent but able to answer questions with only complaint of back pain.  Patient was discussed with STEMI on call who recommended activating Cath Lab.  Due to concern for possible dissection, patient was taken to CT for further evaluation.  Upon return from CT patient had a witnessed generalized tonic-clonic seizure activity.  He was brought back to the ED immediately where he decompensated.  Patient dropped his blood pressure, became bradycardic and then went into asystole.  CPR/ACLS initiated for approximately 15 minutes prior to ROSC achieved.  Patient intubated in the ED.  He was started on Levophed and propofol and taken emergently to Cath Lab.   Pertinent Labs/Diagnostics Findings: Na+/ K+: 139/3.6  Glucose: 296 BUN/Cr.:13/1.47, CO2 14, anion gap 23 WBC: K/L without bands or neutrophil predominance   troponin:   CXR> CTH> CTA Chest> CT Abd/pelvis> Medication  administered in the ED: Disposition: Patient was transferred to the ICU post cardiac cath intubated and sedated.  PCCM consulted  Pertinent  Medical History  Atrial Fibrillation  COPD HTN Essential Tremor   Micro Data:   03/8: Blood x2>>gram positive rods in aerobic bottle only; BCID did not detect yeast or any other organism likely contaminant  03/08: MRSA PCR>>negative  03/09: Tracheal aspirate>>moderate wbc present, few gram positive cocci   Anti-infectives (From admission, onward)    Start     Dose/Rate Route Frequency Ordered Stop   01/23/24 1500  micafungin (MYCAMINE) 100 mg in sodium chloride 0.9 % 100 mL IVPB  Status:  Discontinued        100 mg 105 mL/hr over 1 Hours Intravenous Every 24 hours 01/23/24 1405 01/23/24 1540   01/21/24 0900  azithromycin (ZITHROMAX) 500 mg in sodium chloride 0.9 % 250 mL IVPB        500 mg 250 mL/hr over 60 Minutes Intravenous Every 24 hours 01/21/24 0726 02/13/2024 0859   01/21/24 0800  piperacillin-tazobactam (ZOSYN) IVPB 3.375 g        3.375 g 12.5 mL/hr over 240 Minutes Intravenous Every 8 hours 01/21/24 0729 01/28/2024 0759      Significant Hospital Events: Including procedures, antibiotic start and stop dates in addition to other pertinent events   3/8: intubated s/p PEA arrest, admit to ICU, LHC negative 3/9: remains intubated, passed SBT, but with depressed mental status and increased secretions 3/10: Overnight pt became bradycardic hr 30 to low 40's secondary to precedex gtt which was  discontinued.  Following discontinuation of precedex gtt pt became agitated/combative attempting to hit/bite staff.  This morning pt converted to atrial fibrillation with rvr requiring amiodarone gtt.  He also became hypoxic O2 sats in the 70's FiO2 increased from 40% to 70%.  Propofol gtt restarted.  Converted back to NSR.  Currently requiring levophed gtt at 17 mcg/min to maintain map 65 or higher.   3/11: Pt remains mechanically intubated vent settings:  FiO2 40%/PEEP 8.  Now following commands.  Levophed gtt @12  mcg/min to maintain map 65 or higher.  CVP improved 12-13 following iv fluid resuscitation.  Tentative plan for partial hip replacement on 03/13  Interim History / Subjective:  As outlined above under significant events   Objective   Blood pressure (!) 103/52, pulse (!) 58, temperature 99.4 F (37.4 C), temperature source Oral, resp. rate 17, height 5' 9.02" (1.753 m), weight 79.6 kg, SpO2 99%. CVP:  [0 mmHg-57 mmHg] 12 mmHg  Vent Mode: PRVC FiO2 (%):  [40 %-50 %] 40 % Set Rate:  [22 bmp] 22 bmp Vt Set:  [400 mL] 400 mL PEEP:  [5 cmH20-8 cmH20] 8 cmH20 Plateau Pressure:  [14 cmH20-15 cmH20] 15 cmH20   Intake/Output Summary (Last 24 hours) at 01/24/2024 1022 Last data filed at 01/24/2024 1000 Gross per 24 hour  Intake 4117.11 ml  Output 1270 ml  Net 2847.11 ml   Filed Weights   01/22/24 0500 01/23/24 0458 01/24/24 0354  Weight: 75.8 kg 77.5 kg 79.6 kg   Examination: General: Acute on chronically-ill appearing male, NAD mechanically intubated  HENT: Supple, no JVD  Lungs: Faint rhonchi throughout, even, non labored  Cardiovascular: NSR, s1s2, no m/r/g, 2+ radial/1+ distal pulses Abdomen: +BS x4, soft, non tender, non distended  Extremities: No edema  Neuro: Sedated, following commands, PERRL  GU: Indwelling foley catheter draining yellow urine   Resolved Hospital Problem list     Assessment & Plan:   #Acute metabolic encephalopathy  #Acute pain secondary to acute/subacute right femur fracture #Possible seizure-like activity  #Mechanical intubation pain/discomfort  Hx: Tremors  - Correct metabolic derangements and treat acute pain - Maintain RASS goal 0 to -1 - PAD protocol to maintain RASS goal: Propofol and fentanyl gtts; did not tolerate precedex gtt due to severe bradycardia - WUA daily  - EEG 03/10 negative for seizures or epileptiform discharges  - Seizure precautions   #Distributive shock unclear  source  #Cardiac arrest  #Atrial fibrillation with rvr~resolved #STEMI~cardiac cath 01/21/24 revealed no CAD  Hx: HTN and near syncope  Echo 01/21/2024: EF 50 to 55%; right ventricular systolic function mildly reduced; trivial mitral valve regurgitation; mild tricuspid valve regurgitation, mild aortic valve regurgitation  - Continuous telemetry monitoring  - Troponin peaked at 1,118 - Prn levophed and/or vasopressin gtts to maintain map 65 or higher  - Continue amiodarone gtt for now  - Continue heparin gtt: dosing per pharmacy  - Cardiology consulted appreciate input: will sign off today 03/11 - Hold outpatient antihypertensives for now   #Acute respiratory failure in the setting of cardiac arrest  #Mechanical intubation  Hx: COPD  - Full vent support for now: vent settings reviewed and established  - Continue lung protective strategies  - Maintain plateau pressures less than 30 cm H2O - VAP bundle implemented  - SBT once all parameters met  - Intermittent CXR and ABG's  - Will remain mechanically intubated until he undergoes partial hip replacement on 03/13  #Acute kidney injury secondary to ATN #Lactic acidosis~improving  -  Trend BMP  - Replace electrolytes as indicated - Strict I&O's - Avoid nephrotoxic agents as able   #Aspiration pneumonia  - Trend WBC and monitor fever curve  - Follow cultures  - Continue abx as outlined pending culture results and sensitivities   #Anemia without signs of overt bleeding  #Thrombocytopenia  - Trend CBC  - Monitor for s/sx of bleeding  - Transfuse for hgb <8  #Right femoral neck fracture  - Fentanyl gtt for pain management  - Ortho consulted appreciate input: tentative plan for partial hip replacement on 03/13  #Endo  - CBG's q4hrs  - SSI  - Follow hyper/hypoglycemic protocol  - Target CBG range 140 to 180  Best Practice (right click and "Reselect all SmartList Selections" daily)   Diet/type: NPO DVT prophylaxis systemic  heparin Pressure ulcer(s): N/A GI prophylaxis: PPI Lines: Central line Foley:  Yes, and it is still needed Code Status: DNR Last date of multidisciplinary goals of care discussion [01/24/2024]  03/11: Will update family today when they arrive at bedside.  Palliative consult pending to discuss goals of care  Labs   CBC: Recent Labs  Lab 01/21/24 0254 01/21/24 1258 01/22/24 0403 01/23/24 0450 01/23/24 1624 01/24/24 0350  WBC 14.9* 24.7* 22.9* 11.5*  --  13.4*  HGB 13.3 13.2 13.2 10.2* 10.6* 10.2*  HCT 41.9 39.3 39.2 31.5* 32.3* 31.6*  MCV 95.0 92.3 92.9 95.5  --  97.2  PLT 215 196 188 104*  --  116*    Basic Metabolic Panel: Recent Labs  Lab 01/21/24 0350 01/21/24 1258 01/22/24 0403 01/23/24 0450 01/23/24 1655 01/24/24 0350  NA 139 139 141 140  --  139  K 3.6 3.6 4.4 3.6  --  3.5  CL 102 109 107 107  --  106  CO2 14* 21* 21* 26  --  25  GLUCOSE 296* 195* 173* 173*  --  148*  BUN 13 16 24* 27*  --  29*  CREATININE 1.47* 1.76* 2.39* 1.80*  --  1.88*  CALCIUM 11.2* 8.2* 8.3* 7.7*  --  7.7*  MG 2.9* 2.6* 2.8* 2.5* 2.5* 2.7*  PHOS  --  3.6 5.8* 3.2 3.4 3.3   GFR: Estimated Creatinine Clearance: 26.6 mL/min (A) (by C-G formula based on SCr of 1.88 mg/dL (H)). Recent Labs  Lab 01/21/24 0622 01/21/24 1257 01/21/24 1258 01/21/24 1658 01/22/24 0403 01/23/24 0450 01/23/24 0757 01/24/24 0350  PROCALCITON <0.10  --   --   --   --   --   --   --   WBC  --   --  24.7*  --  22.9* 11.5*  --  13.4*  LATICACIDVEN 6.5* 3.1*  --  2.6*  --   --  2.1*  --     Liver Function Tests: Recent Labs  Lab 01/21/24 0350 01/21/24 1258  AST 105* 146*  ALT 104* 118*  ALKPHOS 57 57  BILITOT 0.4 1.1  PROT 5.4* 5.3*  ALBUMIN 2.9* 2.9*   No results for input(s): "LIPASE", "AMYLASE" in the last 168 hours. No results for input(s): "AMMONIA" in the last 168 hours.  ABG    Component Value Date/Time   PHART 7.36 01/22/2024 0404   PCO2ART 40 01/22/2024 0404   PO2ART 119 (H)  01/22/2024 0404   HCO3 22.6 01/22/2024 0404   ACIDBASEDEF 2.7 (H) 01/22/2024 0404   O2SAT 78.1 01/23/2024 1215     Coagulation Profile: Recent Labs  Lab 01/21/24 0609  INR 2.0*  Cardiac Enzymes: Recent Labs  Lab 01/21/24 0904  CKMB 19.0*    HbA1C: HB A1C (BAYER DCA - WAIVED)  Date/Time Value Ref Range Status  12/07/2019 02:21 PM 5.7 <7.0 % Final    Comment:                                          Diabetic Adult            <7.0                                       Healthy Adult        4.3 - 5.7                                                           (DCCT/NGSP) American Diabetes Association's Summary of Glycemic Recommendations for Adults with Diabetes: Hemoglobin A1c <7.0%. More stringent glycemic goals (A1c <6.0%) may further reduce complications at the cost of increased risk of hypoglycemia.    Hgb A1c MFr Bld  Date/Time Value Ref Range Status  01/21/2024 06:09 AM 5.7 (H) 4.8 - 5.6 % Final    Comment:    (NOTE) Pre diabetes:          5.7%-6.4%  Diabetes:              >6.4%  Glycemic control for   <7.0% adults with diabetes     CBG: Recent Labs  Lab 01/23/24 1531 01/23/24 1927 01/23/24 2311 01/24/24 0321 01/24/24 0717  GLUCAP 152* 121* 145* 131* 129*    Review of Systems:   Unable to assess pt mechanically intubated   Past Medical History:  He,  has a past medical history of Atrial fibrillation (HCC), COPD (chronic obstructive pulmonary disease) (HCC), Hypertension, Near syncope, and Tremors of nervous system.   Surgical History:   Past Surgical History:  Procedure Laterality Date   BACK SURGERY     BRAIN SURGERY     stimulator placement for tremors   CORONARY/GRAFT ACUTE MI REVASCULARIZATION N/A 01/21/2024   Procedure: Coronary/Graft Acute MI Revascularization;  Surgeon: Alwyn Pea, MD;  Location: [facility] INVASIVE CV LAB;  Service: Cardiovascular;  Laterality: N/A;   EYE SURGERY Left 2020   cataract surgery nov    EYE SURGERY  Right 2020   cataract surgery dec   LEFT HEART CATH AND CORONARY ANGIOGRAPHY N/A 01/21/2024   Procedure: LEFT HEART CATH AND CORONARY ANGIOGRAPHY;  Surgeon: Alwyn Pea, MD;  Location: [facility] INVASIVE CV LAB;  Service: Cardiovascular;  Laterality: N/A;     Social History:   reports that he quit smoking about 45 years ago. His smoking use included cigarettes. He has never used smokeless tobacco. He reports current alcohol use. He reports that he does not use drugs.   Family History:  His family history includes Obesity in his sister; Tremor in his paternal grandmother; Tuberculosis in his mother. There is no history of Prostate cancer, Bladder Cancer, or Kidney cancer.   Allergies No Known Allergies   Home Medications  Prior to Admission medications   Medication Sig Start Date End Date Taking?  Authorizing Provider  allopurinol (ZYLOPRIM) 100 MG tablet Take 1 tablet (100 mg total) by mouth daily. Monday, Wednesday, Friday 07/01/23   Olevia Perches P, DO  amLODipine (NORVASC) 5 MG tablet Take 1 tablet (5 mg total) by mouth daily. 09/30/23 09/29/24  Laurier Nancy, MD  fluticasone-salmeterol (ADVAIR) 250-50 MCG/ACT AEPB INHALE 1 PUFF INTO THE LUNGS TWICE DAILY 12/23/23   Olevia Perches P, DO  furosemide (LASIX) 20 MG tablet TAKE 1 TABLET DAILY. 09/01/23   Laurier Nancy, MD  losartan (COZAAR) 100 MG tablet Take 1 tablet (100 mg total) by mouth daily. 11/21/23   Johnson, Megan P, DO  magnesium oxide (MAG-OX) 400 MG tablet Take 400 mg by mouth daily.    [provider]  memantine (NAMENDA) 5 MG tablet Take 5mg  once daily for one week, then increase to 5mg  twice daily for memory 10/26/23   [provider]  metoprolol succinate (TOPROL-XL) 50 MG 24 hr tablet Take 1 tablet (50 mg total) by mouth daily. 11/18/23   Johnson, Megan P, DO  metoprolol tartrate (LOPRESSOR) 50 MG tablet Take by mouth.    [provider]  Multiple Vitamins-Minerals (ZINC PO) Take 1 tablet by mouth  daily.    [provider]  simvastatin (ZOCOR) 20 MG tablet Take 1 tablet (20 mg total) by mouth daily. 10/27/23   Johnson, Megan P, DO  triamcinolone cream (KENALOG) 0.1 % Apply 1 Application topically 2 (two) times daily. 10/31/23   Johnson, Megan P, DO  XARELTO 15 MG TABS tablet Take 1 tablet (15 mg total) by mouth daily with supper. 12/20/23   Laurier Nancy, MD  Zinc 50 MG TABS Take 50 mg by mouth daily.     [provider]     Critical care time: 40 minutes      Zada Girt, AGNP  Pulmonary/Critical Care Pager [phone] (please enter 7 digits) PCCM Consult Pager [phone] (please enter 7 digits)

## 2024-01-25 DIAGNOSIS — E440 Moderate protein-calorie malnutrition: Secondary | ICD-10-CM | POA: Diagnosis not present

## 2024-01-25 DIAGNOSIS — R579 Shock, unspecified: Secondary | ICD-10-CM | POA: Diagnosis not present

## 2024-01-25 DIAGNOSIS — S72001A Fracture of unspecified part of neck of right femur, initial encounter for closed fracture: Secondary | ICD-10-CM | POA: Diagnosis not present

## 2024-01-25 DIAGNOSIS — Z515 Encounter for palliative care: Secondary | ICD-10-CM | POA: Diagnosis not present

## 2024-01-25 DIAGNOSIS — J690 Pneumonitis due to inhalation of food and vomit: Secondary | ICD-10-CM | POA: Diagnosis not present

## 2024-01-25 DIAGNOSIS — J9601 Acute respiratory failure with hypoxia: Secondary | ICD-10-CM | POA: Diagnosis not present

## 2024-01-25 LAB — CBC
HCT: 28.7 % — ABNORMAL LOW (ref 39.0–52.0)
Hemoglobin: 9.4 g/dL — ABNORMAL LOW (ref 13.0–17.0)
MCH: 31.6 pg (ref 26.0–34.0)
MCHC: 32.8 g/dL (ref 30.0–36.0)
MCV: 96.6 fL (ref 80.0–100.0)
Platelets: 109 10*3/uL — ABNORMAL LOW (ref 150–400)
RBC: 2.97 MIL/uL — ABNORMAL LOW (ref 4.22–5.81)
RDW: 14.5 % (ref 11.5–15.5)
WBC: 11.8 10*3/uL — ABNORMAL HIGH (ref 4.0–10.5)
nRBC: 0 % (ref 0.0–0.2)

## 2024-01-25 LAB — CULTURE, BLOOD (ROUTINE X 2)

## 2024-01-25 LAB — BASIC METABOLIC PANEL
Anion gap: 8 (ref 5–15)
BUN: 26 mg/dL — ABNORMAL HIGH (ref 8–23)
CO2: 25 mmol/L (ref 22–32)
Calcium: 7.6 mg/dL — ABNORMAL LOW (ref 8.9–10.3)
Chloride: 105 mmol/L (ref 98–111)
Creatinine, Ser: 1.53 mg/dL — ABNORMAL HIGH (ref 0.61–1.24)
GFR, Estimated: 43 mL/min — ABNORMAL LOW (ref >60)
Glucose, Bld: 155 mg/dL — ABNORMAL HIGH (ref 70–99)
Potassium: 3.7 mmol/L (ref 3.5–5.1)
Sodium: 138 mmol/L (ref 135–145)

## 2024-01-25 LAB — GLUCOSE, CAPILLARY
Glucose-Capillary: 141 mg/dL — ABNORMAL HIGH (ref 70–99)
Glucose-Capillary: 130 mg/dL — ABNORMAL HIGH (ref 70–99)
Glucose-Capillary: 125 mg/dL — ABNORMAL HIGH (ref 70–99)
Glucose-Capillary: 155 mg/dL — ABNORMAL HIGH (ref 70–99)
Glucose-Capillary: 152 mg/dL — ABNORMAL HIGH (ref 70–99)
Glucose-Capillary: 135 mg/dL — ABNORMAL HIGH (ref 70–99)

## 2024-01-25 LAB — HEPARIN LEVEL (UNFRACTIONATED)
Heparin Unfractionated: 0.25 [IU]/mL — ABNORMAL LOW (ref 0.30–0.70)
Heparin Unfractionated: 0.27 [IU]/mL — ABNORMAL LOW (ref 0.30–0.70)
Heparin Unfractionated: 0.41 [IU]/mL (ref 0.30–0.70)

## 2024-01-25 LAB — PHOSPHORUS: Phosphorus: 2.4 mg/dL — ABNORMAL LOW (ref 2.5–4.6)

## 2024-01-25 LAB — MAGNESIUM: Magnesium: 2.5 mg/dL — ABNORMAL HIGH (ref 1.7–2.4)

## 2024-01-25 MED ORDER — POTASSIUM CHLORIDE 20 MEQ PO PACK
40.0000 meq | PACK | Freq: Once | ORAL | Status: AC
  Administered 2024-01-25: 40 meq
  Filled 2024-01-25: qty 2

## 2024-01-25 MED ORDER — HEPARIN BOLUS VIA INFUSION
1050.0000 [IU] | Freq: Once | INTRAVENOUS | Status: AC
  Administered 2024-01-25: 1050 [IU] via INTRAVENOUS
  Filled 2024-01-25: qty 1050

## 2024-01-25 NOTE — Consult Note (Signed)
 PHARMACY - ANTICOAGULATION CONSULT NOTE  Pharmacy Consult for Heparin Infusion Indication: atrial fibrillation  No Known Allergies  Patient Measurements: Height: 5' 9.02" (175.3 cm) Weight: 77.4 kg (170 lb 10.2 oz) IBW/kg (Calculated) : 70.74 Heparin Dosing Weight: 71.2 kg   Vital Signs: Temp: 98.9 F (37.2 C) (03/12 0745) Temp Source: Oral (03/12 0745) BP: 101/50 (03/12 0745) Pulse Rate: 64 (03/12 0745)  Labs: Recent Labs    01/23/24 0450 01/23/24 0757 01/23/24 1229 01/23/24 1624 01/23/24 2249 01/24/24 0350 01/24/24 0617 01/25/24 0410  HGB 10.2*  --   --  10.6*  --  10.2*  --  9.4*  HCT 31.5*  --   --  32.3*  --  31.6*  --  28.7*  PLT 104*  --   --   --   --  116*  --  109*  APTT 66*  --  49*  --  76*  --  69*  --   HEPARINUNFRC 0.44  --   --   --   --   --  0.34 0.25*  CREATININE 1.80*  --   --   --   --  1.88*  --  1.53*  TROPONINIHS  --  540*  --   --   --   --   --   --    Estimated Creatinine Clearance: 32.7 mL/min (A) (by C-G formula based on SCr of 1.53 mg/dL (H)).  Medical History: Past Medical History:  Diagnosis Date   Atrial fibrillation (HCC)    COPD (chronic obstructive pulmonary disease) (HCC)    Hypertension    Near syncope    Tremors of nervous system    Assessment: Carlos Richard is a 89 y.o. male presenting with STEMI. PMH significant for essential tremor, BPH, COPD, AF, HLD, CKD, HTN. Patient was on [facility] PTA per chart review. From chart review, patient takes Xarelto 15 mg daily at home. Per patient's wife, last dose was taken 3/7 afternoon.  Pharmacy has been consulted to initiate and manage heparin infusion.   Baseline Labs: aPTT 124, PT 23.0, INR 2.0, Hgb 13.3, Hct 41.9, Plt 215   Goal of Therapy:  Heparin level 0.3-0.7 units/ml aPTT 66-102 seconds Monitor platelets by anticoagulation protocol: Yes   Plan: heparin level remains SUBtherapeutic despite recent rate adjustment - Will order heparin 1050 units IV X 1 bolus and increase  drip rate to 1050 units/hr - Will recheck HL 8 hrs after rate change   - Continue to monitor H&H and platelets daily while on heparin infusion    Thank you allowing pharmacy to participate in this patient's care.   Lowella Bandy, PharmD 01/25/2024 8:53 AM

## 2024-01-25 NOTE — Consult Note (Signed)
 Palliative Care Progress Note, Assessment & Plan   Patient Name: Carlos Richard       Date: 01/25/2024 DOB: 1932/07/07  Age: 89 y.o. MRN#: [id number] Attending Physician: Janann Colonel, MD Primary Care Physician: Dorcas Carrow, DO Admit Date: 01/21/2024  Subjective: Patient is lying in bed in no apparent distress with mechanical ventilator still in place.  Propofol has been turned off and fentanyl has been reduced for wake-up assessment.  Patient is able to follow commands by squeezing hands and wiggling his toes.  No family or friends present at bedside during my assessment.  HPI: 89 y.o male with significant PMH of atrial fibrillation with slow ventricular response, B12 deficiency, HTN, HLD, COPD, essential tremors (ET), left thalamic DBS stimulator for ET who presented to the ED as a code STEMI.   Per ED reports, patient was sleeping when he woke up screaming.  Patient's wife found him with agonal respiration and started CPR although unclear whether he had a pulse or not.  EMS was called and on their arrival patient was found to be hypotensive, altered with agonal respirations.  Levophed was started with slight improvement in his blood pressure.  Initial EKG concerning for ST elevation in V1 through V3 therefore code STEMI called en route to the ED  3/8: intubated s/p PEA arrest, admit to ICU, LHC negative 3/9: remains intubated, passed SBT, but with depressed mental status and increased secretions 3/10: Overnight pt became bradycardic hr 30 to low 40's secondary to precedex gtt which was discontinued.  Following discontinuation of precedex gtt pt became agitated/combative attempting to hit/bite staff.  This morning pt converted to atrial fibrillation with rvr requiring amiodarone gtt.  He also  became hypoxic O2 sats in the 70's FiO2 increased from 40% to 70%.  Propofol gtt restarted.  Converted back to NSR.  Currently requiring levophed gtt at 17 mcg/min to maintain map 65 or higher.   3/11: Pt remains mechanically intubated vent settings: FiO2 40%/PEEP 8.  Now following commands.  Levophed gtt @12  mcg/min to maintain map 65 or higher.  CVP improved 12-13 following iv fluid resuscitation.  Tentative plan for partial hip replacement on 03/13  Summary of counseling/coordination of care: Extensive chart review completed prior to meeting patient including labs, vital signs, imaging, progress notes, orders, and available advanced directive documents from current and previous encounters.   After reviewing the patient's chart and assessing the patient at bedside, I spoke with patient's family in regards to symptom management and goals of care.   With patient's wife, his sister-in-law, his daughter, and attending Dr. Larinda Buttery in consult room just outside of ICU.  Brief medical update given.  Discussed that patient is showing signs of improvement by being able to be awakened on the vent, having minimal vent support, making breaths on his own, and being able to follow commands.    As per attending, patient is being optimized and could be appropriate for orthopedic surgery to correct hip fracture.  On the contrary, we discussed that moving forward with surgery is not necessary.  It would be a palliative surgery since patient will not be able to fully recover given his ongoing comorbidities and underlying dementia.  Pros and cons of surgery discussed within the context of patient/family's overall goals-remaining free from pain and having quality of life.  Family shared understanding of both options.  After extensive discussion, family in agreement to move forward with orthopedic surgery while allowing patient to remain intubated until postop.  Attending to speak with orthopedics in regards to  moving forward with surgery.  No adjustment to plan of care at this time.  PMT will continue to follow and support patient and family throughout his hospitalization.  Physical Exam Vitals reviewed.  Constitutional:      General: He is not in acute distress.    Appearance: He is normal weight.  HENT:     Head: Normocephalic.     Mouth/Throat:     Mouth: Mucous membranes are moist.  Cardiovascular:     Rate and Rhythm: Normal rate.  Abdominal:     Palpations: Abdomen is soft.  Skin:    General: Skin is warm and dry.  Psychiatric:        Behavior: Behavior normal.             Total Time 75 minutes   Time spent includes: Detailed review of medical records (labs, imaging, vital signs), medically appropriate exam (mental status, respiratory, cardiac, skin), discussed with treatment team, counseling and educating patient, family and staff, documenting clinical information, medication management and coordination of care.  Samara Deist L. Bonita Quin, DNP, FNP-BC Palliative Medicine Team

## 2024-01-25 NOTE — Progress Notes (Signed)
 NAME:  Carlos Richard, MRN:  [id number], DOB:  [birth year], LOS: 4 ADMISSION DATE:  01/21/2024  History of Present Illness:  89 y.o male with significant PMH of atrial fibrillation with slow ventricular response, B12 deficiency, HTN, HLD, COPD, essential tremors (ET), left thalamic DBS stimulator for ET who presented to the ED as a code STEMI.   Per ED reports, patient was sleeping when he woke up screaming.  Patient's wife found him with agonal respiration and started CPR although unclear whether he had a pulse or not.  EMS was called and on their arrival patient was found to be hypotensive, altered with agonal respirations.  Levophed was started with slight improvement in his blood pressure.  Initial EKG concerning for ST elevation in V1 through V3 therefore code STEMI called en route to the ED.   ED Course: Initial vital signs showed HR of beats/minute, BP mm Hg, the RR 30 breaths/minute, and the oxygen saturation % on and a temperature of 98.72F (36.9C).  He was awake somnolent but able to answer questions with only complaint of back pain.  Patient was discussed with STEMI on call who recommended activating Cath Lab.  Due to concern for possible dissection, patient was taken to CT for further evaluation.  Upon return from CT patient had a witnessed generalized tonic-clonic seizure activity.  He was brought back to the ED immediately where he decompensated.  Patient dropped his blood pressure, became bradycardic and then went into asystole.  CPR/ACLS initiated for approximately 15 minutes prior to ROSC achieved.  Patient intubated in the ED.  He was started on Levophed and propofol and taken emergently to Cath Lab.   Pertinent Labs/Diagnostics Findings: Na+/ K+: 139/3.6  Glucose: 296 BUN/Cr.:13/1.47, CO2 14, anion gap 23 WBC: K/L without bands or neutrophil predominance   troponin:   CXR> CTH> CTA Chest> CT Abd/pelvis> Medication administered in the ED: Disposition: Patient was transferred to  the ICU post cardiac cath intubated and sedated.  PCCM consulted  Pertinent  Medical History  Atrial Fibrillation  COPD HTN Essential Tremor   Significant Hospital Events: Including procedures, antibiotic start and stop dates in addition to other pertinent events   3/8: intubated s/p PEA arrest, admit to ICU, LHC negative 3/9: remains intubated, passed SBT, but with depressed mental status and increased secretions 3/10: Overnight pt became bradycardic hr 30 to low 40's secondary to precedex gtt which was discontinued.  Following discontinuation of precedex gtt pt became agitated/combative attempting to hit/bite staff.  This morning pt converted to atrial fibrillation with rvr requiring amiodarone gtt.  He also became hypoxic O2 sats in the 70's FiO2 increased from 40% to 70%.  Propofol gtt restarted.  Converted back to NSR.  Currently requiring levophed gtt at 17 mcg/min to maintain map 65 or higher.   3/11: Pt remains mechanically intubated vent settings: FiO2 40%/PEEP 8.  Now following commands.  Levophed gtt @12  mcg/min to maintain map 65 or higher.  CVP improved 12-13 following iv fluid resuscitation.  Tentative plan for partial hip replacement on 03/13  Interim History / Subjective:  Patient was seen and examined. He remains intubated and sedated.  On low dose NE.   Objective   Blood pressure (!) 101/50, pulse 64, temperature 98.9 F (37.2 C), temperature source Oral, resp. rate 12, height 5' 9.02" (1.753 m), weight 77.4 kg, SpO2 99%. CVP:  [10 mmHg-21 mmHg] 16 mmHg  Vent Mode: PRVC FiO2 (%):  [30 %-40 %] 40 % Set Rate:  [22  bmp] 22 bmp Vt Set:  [400 mL] 400 mL PEEP:  [5 cmH20-8 cmH20] 5 cmH20 Plateau Pressure:  [14 cmH20-16 cmH20] 14 cmH20   Intake/Output Summary (Last 24 hours) at 01/25/2024 0848 Last data filed at 01/25/2024 0527 Gross per 24 hour  Intake 2849.27 ml  Output 1047 ml  Net 1802.27 ml   Filed Weights   01/23/24 0458 01/24/24 0354 01/25/24 0346  Weight: 77.5  kg 79.6 kg 77.4 kg    Examination: General: Intubated and sedated HENT: Supple neck, reactive pupils  Lungs: Coarse breath sounds bilaterally Cardiovascular: Normal S1, Normal S2, RRR  Abdomen: Soft, non tender, non distended, +BS  Extremities: Warm and well perfused.   Assessment & Plan:  89 year old male patient with a past medical history of A-fib, hypertension with recurrent falls presenting with altered mental status with subsequent development of cardiac arrest in the hospital.  Intubated on mechanical ventilation.   #Acute hypoxic respiratory failure complicated by #Cardiac arrest secondary to #Distributive shock likely secondary to pneumonia with course complicated by #Right femoral neck fracture #AKI likely secondary to septic ATN #A-fib   Neuro -sedated with propofol and fentanyl.  RASS 0 to -1. CPOT < 2. SAT and SBT this AM.  Cardiovascular -DDx include obstructive shock due to fat embolus or septic shock.  Currently on nor epi for MAP greater than 65. Sputum culture w. Gram + Cocci. Pulm -continue with vent support.  SAT SBT in the morning.  On broad-spectrum antibiotics for aspiration coverage. Renal monitor urine output. Heme on heparin drip. Endo POC 1 40-1 80.   Will assess mental status on SAT and lung mechanics on SBT should that improved than I will discuss with ortho regarding right hip arthroplasty prior to extubation.   Best Practice (right click and "Reselect all SmartList Selections" daily)   Diet/type: tubefeeds DVT prophylaxis systemic heparin Pressure ulcer(s): N/A GI prophylaxis: PPI Lines: Central line Foley:  Yes, and it is still needed Code Status:  DNR  Last date of multidisciplinary goals of care discussion [01/25/2024]  Critical care time: 45 minutes  Janann Colonel, MD [facility] 01/25/2024 8:54 AM

## 2024-01-25 NOTE — Consult Note (Addendum)
 PHARMACY CONSULT NOTE - ELECTROLYTES  Pharmacy Consult for Electrolyte Monitoring and Replacement   Recent Labs: Height: 5' 9.02" (175.3 cm) Weight: 77.4 kg (170 lb 10.2 oz) IBW/kg (Calculated) : 70.74 Estimated Creatinine Clearance: 32.7 mL/min (A) (by C-G formula based on SCr of 1.53 mg/dL (H)). Potassium (mmol/L)  Date Value  01/25/2024 3.7  11/13/2013 3.6   Magnesium (mg/dL)  Date Value  13/06/6577 2.5 (H)   Calcium (mg/dL)  Date Value  46/96/2952 7.6 (L)   Calcium, Total (mg/dL)  Date Value  84/13/2440 8.8   Albumin (g/dL)  Date Value  09/11/2535 2.9 (L)  09/20/2023 4.2  11/13/2013 3.8   Phosphorus (mg/dL)  Date Value  64/40/3474 2.4 (L)   Sodium (mmol/L)  Date Value  01/25/2024 138  10/31/2023 145 (H)  11/13/2013 141    Assessment  Carlos Richard is a 89 y.o. male presenting with chest pain. PMH significant for atrial fibrillation with slow ventricular response, B12 deficiency, HTN, HLD, COPD, essential tremors (ET), left thalamic DBS stimulator. Pharmacy has been consulted to monitor and replace electrolytes.  MIVF: Free water @ 30 mL/hr  Nutrition: Vital 1.2 @ 60 mL/hr  Goal of Therapy: Electrolytes WNL (K > 4 and Mg >2  due to afib) Corrected Ca: 8.5 mg/dL   Plan:  K = 3.7; Give 40 mEq Kcl per tube x 1 No other replacement indicated at this time Continue to monitor electrolytes with AM labs   Thank you for allowing pharmacy to be a part of this patient's care.  Effie Shy, PharmD Pharmacy Resident  01/25/2024 6:34 AM

## 2024-01-25 NOTE — Progress Notes (Signed)
 ID autoconsult done last week for yeast Identified in blood which turned out to be a corynebacterium a skin contaminant, which need not be treated Rest of management as per intensivist ID will sign off-

## 2024-01-25 NOTE — Consult Note (Signed)
 PHARMACY - ANTICOAGULATION CONSULT NOTE  Pharmacy Consult for Heparin Infusion Indication: atrial fibrillation  No Known Allergies  Patient Measurements: Height: 5' 9.02" (175.3 cm) Weight: 77.4 kg (170 lb 10.2 oz) IBW/kg (Calculated) : 70.74 Heparin Dosing Weight: 71.2 kg   Vital Signs: Temp: 100.2 F (37.9 C) (03/12 0353) Temp Source: Axillary (03/12 0353) BP: 102/48 (03/12 0345) Pulse Rate: 65 (03/12 0345)  Labs: Recent Labs    01/23/24 0450 01/23/24 0757 01/23/24 1229 01/23/24 1624 01/23/24 2249 01/24/24 0350 01/24/24 0617 01/25/24 0410  HGB 10.2*  --   --  10.6*  --  10.2*  --  9.4*  HCT 31.5*  --   --  32.3*  --  31.6*  --  28.7*  PLT 104*  --   --   --   --  116*  --  109*  APTT 66*  --  49*  --  76*  --  69*  --   HEPARINUNFRC 0.44  --   --   --   --   --  0.34 0.25*  CREATININE 1.80*  --   --   --   --  1.88*  --  1.53*  TROPONINIHS  --  540*  --   --   --   --   --   --    Estimated Creatinine Clearance: 32.7 mL/min (A) (by C-G formula based on SCr of 1.53 mg/dL (H)).  Medical History: Past Medical History:  Diagnosis Date   Atrial fibrillation (HCC)    COPD (chronic obstructive pulmonary disease) (HCC)    Hypertension    Near syncope    Tremors of nervous system    Assessment: Carlos Richard is a 89 y.o. male presenting with STEMI. PMH significant for essential tremor, BPH, COPD, AF, HLD, CKD, HTN. Patient was on [facility] PTA per chart review. From chart review, patient takes Xarelto 15 mg daily at home. Per patient's wife, last dose was taken 3/7 afternoon.  Pharmacy has been consulted to initiate and manage heparin infusion.   Baseline Labs: aPTT 124, PT 23.0, INR 2.0, Hgb 13.3, Hct 41.9, Plt 215   Goal of Therapy:  Heparin level 0.3-0.7 units/ml aPTT 66-102 seconds Monitor platelets by anticoagulation protocol: Yes  Date Time aPTT/HL Rate/Comment   3/9 2347 >200/>1.10 12050/supratherapeutic 3/9 1039 199/---  850/supratherapeutic 3/9 2041 105/---  650/supratherapeutic 3/10 0450 66 / 0.44 550/therapeutic x 1 3/10 1229 49/---  550/SUBtherapeutic  3/10     2249    76                   750/Therapeutic X 1  3/11     0617    69 / 0.34         750/Therapeutic X 2  3/12     0410      0.25              750/SUBtherapeutic   Plan:  3/12:  HL @ 0410 = 0.25, SUBtherapeutic  - Will order heparin 1050 units IV X 1 bolus and increase drip rate to 900 units/hr - Will recheck HL 8 hrs after rate change  Continue to monitor H&H and platelets daily while on heparin infusion    Thank you allowing pharmacy to participate in this patient's care.   Onesha Krebbs D, PharmD 01/25/2024 4:59 AM

## 2024-01-25 NOTE — Consult Note (Signed)
 PHARMACY - ANTICOAGULATION CONSULT NOTE  Pharmacy Consult for Heparin Infusion Indication: atrial fibrillation  No Known Allergies  Patient Measurements: Height: 5' 9.02" (175.3 cm) Weight: 77.4 kg (170 lb 10.2 oz) IBW/kg (Calculated) : 70.74 Heparin Dosing Weight: 71.2 kg   Vital Signs: Temp: 100.4 F (38 C) (03/12 1949) Temp Source: Axillary (03/12 1949) BP: 102/44 (03/12 2000) Pulse Rate: 66 (03/12 2000)  Labs: Recent Labs    01/23/24 0450 01/23/24 0757 01/23/24 1229 01/23/24 1624 01/23/24 2249 01/24/24 0350 01/24/24 0617 01/25/24 0410 01/25/24 1226 01/25/24 2150  HGB 10.2*  --   --  10.6*  --  10.2*  --  9.4*  --   --   HCT 31.5*  --   --  32.3*  --  31.6*  --  28.7*  --   --   PLT 104*  --   --   --   --  116*  --  109*  --   --   APTT 66*  --  49*  --  76*  --  69*  --   --   --   HEPARINUNFRC 0.44  --   --   --   --   --  0.34 0.25* 0.27* 0.41  CREATININE 1.80*  --   --   --   --  1.88*  --  1.53*  --   --   TROPONINIHS  --  540*  --   --   --   --   --   --   --   --    Estimated Creatinine Clearance: 32.7 mL/min (A) (by C-G formula based on SCr of 1.53 mg/dL (H)).  Medical History: Past Medical History:  Diagnosis Date   Atrial fibrillation (HCC)    COPD (chronic obstructive pulmonary disease) (HCC)    Hypertension    Near syncope    Tremors of nervous system    Assessment: Carlos Richard is a 89 y.o. male presenting with STEMI. PMH significant for essential tremor, BPH, COPD, AF, HLD, CKD, HTN. Patient was on [facility] PTA per chart review. From chart review, patient takes Xarelto 15 mg daily at home. Per patient's wife, last dose was taken 3/7 afternoon.  Pharmacy has been consulted to initiate and manage heparin infusion.   Baseline Labs: aPTT 124, PT 23.0, INR 2.0, Hgb 13.3, Hct 41.9, Plt 215   Goal of Therapy:  Heparin level 0.3-0.7 units/ml aPTT 66-102 seconds Monitor platelets by anticoagulation protocol: Yes   Plan: 3/12:  HL @ 2150 = 0.41,  therapeutic X 1 - Will continue pt on current rate and recheck HL in 8 hrs on 3/13 @ 0600.   - Continue to monitor H&H and platelets daily while on heparin infusion    Thank you allowing pharmacy to participate in this patient's care.   Bader Stubblefield D, PharmD 01/25/2024 10:50 PM

## 2024-01-26 DIAGNOSIS — Z515 Encounter for palliative care: Secondary | ICD-10-CM | POA: Diagnosis not present

## 2024-01-26 DIAGNOSIS — I469 Cardiac arrest, cause unspecified: Secondary | ICD-10-CM | POA: Diagnosis not present

## 2024-01-26 DIAGNOSIS — S72001A Fracture of unspecified part of neck of right femur, initial encounter for closed fracture: Secondary | ICD-10-CM | POA: Diagnosis not present

## 2024-01-26 DIAGNOSIS — Z66 Do not resuscitate: Secondary | ICD-10-CM

## 2024-01-26 DIAGNOSIS — I213 ST elevation (STEMI) myocardial infarction of unspecified site: Secondary | ICD-10-CM | POA: Diagnosis not present

## 2024-01-26 DIAGNOSIS — J9601 Acute respiratory failure with hypoxia: Secondary | ICD-10-CM | POA: Diagnosis not present

## 2024-01-26 LAB — PHOSPHORUS: Phosphorus: 2.1 mg/dL — ABNORMAL LOW (ref 2.5–4.6)

## 2024-01-26 LAB — BASIC METABOLIC PANEL
Anion gap: 6 (ref 5–15)
BUN: 31 mg/dL — ABNORMAL HIGH (ref 8–23)
CO2: 25 mmol/L (ref 22–32)
Calcium: 7.5 mg/dL — ABNORMAL LOW (ref 8.9–10.3)
Chloride: 109 mmol/L (ref 98–111)
Creatinine, Ser: 1.7 mg/dL — ABNORMAL HIGH (ref 0.61–1.24)
GFR, Estimated: 38 mL/min — ABNORMAL LOW (ref >60)
Glucose, Bld: 145 mg/dL — ABNORMAL HIGH (ref 70–99)
Potassium: 3.8 mmol/L (ref 3.5–5.1)
Sodium: 140 mmol/L (ref 135–145)

## 2024-01-26 LAB — CBC
HCT: 27.8 % — ABNORMAL LOW (ref 39.0–52.0)
Hemoglobin: 9.1 g/dL — ABNORMAL LOW (ref 13.0–17.0)
MCH: 31.3 pg (ref 26.0–34.0)
MCHC: 32.7 g/dL (ref 30.0–36.0)
MCV: 95.5 fL (ref 80.0–100.0)
Platelets: 130 10*3/uL — ABNORMAL LOW (ref 150–400)
RBC: 2.91 MIL/uL — ABNORMAL LOW (ref 4.22–5.81)
RDW: 14.9 % (ref 11.5–15.5)
WBC: 14 10*3/uL — ABNORMAL HIGH (ref 4.0–10.5)
nRBC: 0.3 % — ABNORMAL HIGH (ref 0.0–0.2)

## 2024-01-26 LAB — MAGNESIUM: Magnesium: 2.4 mg/dL (ref 1.7–2.4)

## 2024-01-26 LAB — GLUCOSE, CAPILLARY
Glucose-Capillary: 128 mg/dL — ABNORMAL HIGH (ref 70–99)
Glucose-Capillary: 146 mg/dL — ABNORMAL HIGH (ref 70–99)
Glucose-Capillary: 136 mg/dL — ABNORMAL HIGH (ref 70–99)

## 2024-01-26 LAB — HEPARIN LEVEL (UNFRACTIONATED): Heparin Unfractionated: 0.42 [IU]/mL (ref 0.30–0.70)

## 2024-01-26 LAB — CULTURE, BLOOD (ROUTINE X 2): Culture: NO GROWTH

## 2024-01-26 MED ORDER — GLYCOPYRROLATE 1 MG PO TABS: 1.0000 mg | ORAL_TABLET | ORAL | Status: DC | PRN

## 2024-01-26 MED ORDER — GLYCOPYRROLATE 0.2 MG/ML IJ SOLN: 0.2000 mg | INTRAMUSCULAR | Status: DC | PRN

## 2024-01-26 MED ORDER — SODIUM CHLORIDE 0.9 % IV SOLN: INTRAVENOUS | Status: DC

## 2024-01-26 MED ORDER — GLYCOPYRROLATE 0.2 MG/ML IJ SOLN
0.2000 mg | INTRAMUSCULAR | Status: DC | PRN
  Administered 2024-01-26: 0.2 mg via INTRAVENOUS
  Filled 2024-01-26: qty 1

## 2024-01-26 MED ORDER — LORAZEPAM 2 MG/ML IJ SOLN
2.0000 mg | INTRAMUSCULAR | Status: DC | PRN
  Administered 2024-01-26: 2 mg via INTRAVENOUS
  Filled 2024-01-26: qty 1

## 2024-01-26 MED ORDER — POLYVINYL ALCOHOL 1.4 % OP SOLN: 1.0000 [drp] | Freq: Four times a day (QID) | OPHTHALMIC | Status: DC | PRN

## 2024-01-26 MED ORDER — HYDROMORPHONE BOLUS VIA INFUSION
1.0000 mg | INTRAVENOUS | Status: DC | PRN
  Administered 2024-01-26 (×2): 1 mg via INTRAVENOUS

## 2024-01-26 MED ORDER — HYDROMORPHONE HCL-NACL 50-0.9 MG/50ML-% IV SOLN
0.0000 mg/h | INTRAVENOUS | Status: DC
  Administered 2024-01-26: 1 mg/h via INTRAVENOUS
  Filled 2024-01-26: qty 50

## 2024-01-26 MED ORDER — ACETAMINOPHEN 325 MG PO TABS: 650.0000 mg | ORAL_TABLET | Freq: Four times a day (QID) | ORAL | Status: DC | PRN

## 2024-01-26 MED ORDER — HALOPERIDOL LACTATE 5 MG/ML IJ SOLN: 2.5000 mg | INTRAMUSCULAR | Status: DC | PRN

## 2024-01-26 MED ORDER — ACETAMINOPHEN 650 MG RE SUPP: 650.0000 mg | Freq: Four times a day (QID) | RECTAL | Status: DC | PRN

## 2024-01-30 ENCOUNTER — Ambulatory Visit: Payer: Self-pay | Admitting: Family Medicine

## 2024-02-14 NOTE — Progress Notes (Signed)
 NAME:  Carlos Richard, MRN:  [id number], DOB:  1932/02/06, LOS: 5 ADMISSION DATE:  01/21/2024  History of Present Illness:  89 y.o male with significant PMH of atrial fibrillation with slow ventricular response, B12 deficiency, HTN, HLD, COPD, essential tremors (ET), left thalamic DBS stimulator for ET who presented to the ED as a code STEMI.   Per ED reports, patient was sleeping when he woke up screaming.  Patient's wife found him with agonal respiration and started CPR although unclear whether he had a pulse or not.  EMS was called and on their arrival patient was found to be hypotensive, altered with agonal respirations.  Levophed was started with slight improvement in his blood pressure.  Initial EKG concerning for ST elevation in V1 through V3 therefore code STEMI called en route to the ED.   ED Course: Initial vital signs showed HR of beats/minute, BP mm Hg, the RR 30 breaths/minute, and the oxygen saturation % on and a temperature of 98.33F (36.9C).  He was awake somnolent but able to answer questions with only complaint of back pain.  Patient was discussed with STEMI on call who recommended activating Cath Lab.  Due to concern for possible dissection, patient was taken to CT for further evaluation.  Upon return from CT patient had a witnessed generalized tonic-clonic seizure activity.  He was brought back to the ED immediately where he decompensated.  Patient dropped his blood pressure, became bradycardic and then went into asystole.  CPR/ACLS initiated for approximately 15 minutes prior to ROSC achieved.  Patient intubated in the ED.  He was started on Levophed and propofol and taken emergently to Cath Lab.   Pertinent Labs/Diagnostics Findings: Na+/ K+: 139/3.6  Glucose: 296 BUN/Cr.:13/1.47, CO2 14, anion gap 23 WBC: K/L without bands or neutrophil predominance   troponin:   CXR> CTH> CTA Chest> CT Abd/pelvis> Medication administered in the ED: Disposition: Patient was transferred to  the ICU post cardiac cath intubated and sedated.  PCCM consulted  Pertinent  Medical History  Atrial Fibrillation  COPD HTN Essential Tremor   Significant Hospital Events: Including procedures, antibiotic start and stop dates in addition to other pertinent events   3/8: intubated s/p PEA arrest, admit to ICU, LHC negative 3/9: remains intubated, passed SBT, but with depressed mental status and increased secretions 3/10: Overnight pt became bradycardic hr 30 to low 40's secondary to precedex gtt which was discontinued.  Following discontinuation of precedex gtt pt became agitated/combative attempting to hit/bite staff.  This morning pt converted to atrial fibrillation with rvr requiring amiodarone gtt.  He also became hypoxic O2 sats in the 70's FiO2 increased from 40% to 70%.  Propofol gtt restarted.  Converted back to NSR.  Currently requiring levophed gtt at 17 mcg/min to maintain map 65 or higher.   3/11: Pt remains mechanically intubated vent settings: FiO2 40%/PEEP 8.  Now following commands.  Levophed gtt @12  mcg/min to maintain map 65 or higher.  CVP improved 12-13 following iv fluid resuscitation.  Tentative plan for partial hip replacement on 03/13 03/13: Failure to wean off the ventilator. Decision with family was made to proceed with CMO.   Interim History / Subjective:  Patient was seen and examined. He remains intubated and sedated.  On low dose NE.   Objective   Blood pressure (!) 101/53, pulse (!) 112, temperature 100.3 F (37.9 C), temperature source Axillary, resp. rate (!) 27, height 5' 9.02" (1.753 m), weight 79 kg, SpO2 (!) 54%. CVP:  [10  mmHg-21 mmHg] 14 mmHg  Vent Mode: PRVC FiO2 (%):  [40 %] 40 % Set Rate:  [16 bmp] 16 bmp Vt Set:  [600 mL] 600 mL PEEP:  [5 cmH20] 5 cmH20 Plateau Pressure:  [14 cmH20-17 cmH20] 14 cmH20   Intake/Output Summary (Last 24 hours) at 01/20/2024 1225 Last data filed at 01/25/2024 1206 Gross per 24 hour  Intake 3464.41 ml  Output 619  ml  Net 2845.41 ml   Filed Weights   01/24/24 0354 01/25/24 0346 02/05/2024 0432  Weight: 79.6 kg 77.4 kg 79 kg    Examination: General: Intubated and sedated, follows very minimal commands.  HENT: Supple neck, reactive pupils  Lungs: Coarse breath sounds bilaterally Cardiovascular: Normal S1, Normal S2, RRR  Abdomen: Soft, non tender, non distended, +BS  Extremities: Warm and well perfused.   Assessment & Plan:  89 year old male patient with a past medical history of A-fib, hypertension with recurrent falls presenting with altered mental status with subsequent development of cardiac arrest in the hospital.  Intubated on mechanical ventilation.   #Acute hypoxic respiratory failure complicated by #Cardiac arrest secondary to #Distributive shock likely secondary to pneumonia with course complicated by #Right femoral neck fracture #AKI likely secondary to septic ATN #A-fib   Failed SBT today for the third time and appears to be struggling with tachypnea on the vent. Held a discussion with family members and decided to transition patient to CMO. Orders updated in EMR.   Last date of multidisciplinary goals of care discussion [01/17/2024]  Critical care time: 35 minutes  Janann Colonel, MD [facility] 01/23/2024 12:25 PM

## 2024-02-14 NOTE — TOC Progression Note (Signed)
 Transition of Care [facility]) - Progression Note    Patient Details  Name: Carlos Richard MRN: [id number] Date of Birth: [birth year]  [facility] CM/SW Contact  Garret Reddish, RN Phone Number: 01/25/2024, 11:56 AM  Clinical Narrative:    Chart reviewed.  Noted that patient will be transitioning to comfort care.  TOC will continue to provide support for family.  TOC will continue to follow for discharge planning.     Expected Discharge Plan:  (TBD) Barriers to Discharge: No Barriers Identified  Expected Discharge Plan and Services   Discharge Planning Services: CM Consult   Living arrangements for the past 2 months: Single Family Home                                       Social Determinants of Health (SDOH) Interventions SDOH Screenings   Food Insecurity: No Food Insecurity (01/21/2024)  Housing: Low Risk  (01/21/2024)  Transportation Needs: No Transportation Needs (01/21/2024)  Utilities: Not At Risk (01/21/2024)  Alcohol Screen: Low Risk  (03/08/2023)  Depression (PHQ2-9): Low Risk  (10/31/2023)  Financial Resource Strain: Low Risk  (03/08/2023)  Physical Activity: Insufficiently Active (03/08/2023)  Social Connections: Unknown (01/21/2024)  Stress: No Stress Concern Present (03/08/2023)  Tobacco Use: Medium Risk (12/30/2023)    Readmission Risk Interventions     No data to display

## 2024-02-14 NOTE — Progress Notes (Signed)
 Palliative Care Progress Note, Assessment & Plan   Patient Name: Carlos Richard       Date: 02/02/2024 DOB: July 13, 1932  Age: 89 y.o. MRN#: [id number] Attending Physician: Janann Colonel, MD Primary Care Physician: Dorcas Carrow, DO Admit Date: 01/21/2024  Subjective: Patient is lying in bed with mechanical ventilatory support in place.  RN and attending at bedside during my visit.  Sedation has been removed and pain medication has been turned down.  Patient is having increased work of breathing with respiratory rate in the high 30s, using accessory muscles, and appearing quite uncomfortable.  He is able to respond by squeezing 1 hand but is unable to move his toes.  Respiratory distress noted.  Patient placed back on full support and sedation turned back on.  HPI: 89 y.o male with significant PMH of atrial fibrillation with slow ventricular response, B12 deficiency, HTN, HLD, COPD, essential tremors (ET), left thalamic DBS stimulator for ET who presented to the ED as a code STEMI.   Per ED reports, patient was sleeping when he woke up screaming.  Patient's wife found him with agonal respiration and started CPR although unclear whether he had a pulse or not.  EMS was called and on their arrival patient was found to be hypotensive, altered with agonal respirations.  Levophed was started with slight improvement in his blood pressure.  Initial EKG concerning for ST elevation in V1 through V3 therefore code STEMI called en route to the ED 3/8: intubated s/p PEA arrest, admit to ICU, LHC negative 3/9: remains intubated, passed SBT, but with depressed mental status and increased secretions 3/10: Overnight pt became bradycardic hr 30 to low 40's secondary to precedex gtt which was discontinued.  Following  discontinuation of precedex gtt pt became agitated/combative attempting to hit/bite staff.  This morning pt converted to atrial fibrillation with rvr requiring amiodarone gtt.  He also became hypoxic O2 sats in the 70's FiO2 increased from 40% to 70%.  Propofol gtt restarted.  Converted back to NSR.  Currently requiring levophed gtt at 17 mcg/min to maintain map 65 or higher.   3/11: Pt remains mechanically intubated vent settings: FiO2 40%/PEEP 8.  Now following commands.  Levophed gtt @12  mcg/min to maintain map 65 or higher.  CVP improved 12-13 following iv fluid resuscitation.  Tentative plan for partial hip replacement on 03/13 03/13: Failure to wean off the ventilator. Decision with family was made to proceed with CMO.   Summary of counseling/coordination of care: Extensive chart review completed prior to meeting patient including labs, vital signs, imaging, progress notes, orders, and available advanced directive documents from current and previous encounters.   After reviewing the patient's chart and assessing the patient at bedside, I spoke with patient's wife, two sons, and sister in law alongside Dr. Larinda Buttery in regards to symptom management and goals of care.   Medical update given.  Space and opportunity provided for family to share their thoughts and emotions regarding patient's current medical situation.  Family remains in agreement that they do not want the patient to be in pain or suffer.   Discussed that patient has failed to wean off of the ventilator without becoming agitated and having increased  work of breathing. As per MD, patient is not stable to undergo orthopedic surgery.  The recommendation at this time is for one-way extubation.  Discussed that if postextubation patient shows signs of respiratory distress, suffering, or pain, measures will be taken to shift to full comfort measures.  Comfort measures discussed in detail with all family members present.  Family remains in  agreement.  Plan is for one-way extubation and likely comfort measures thereafter.    Therapeutic silence, active listening, and emotional support provided.  Family members do not wish to be present when patient is being extubated.  They asked that extubation be held until patient's 2 grandchildren can visit with him bedside.  They will noti nursing/medical team once family has had their opportunity to visit.  Plan support offered and accepted.  Nurse secretary has contacted chaplain services for them to visit with family now.  PMT will continue to follow and support patient and family throughout his hospitalization.  Physical Exam Vitals reviewed.  Constitutional:      General: He is in acute distress.     Appearance: He is ill-appearing.  HENT:     Head: Normocephalic.     Mouth/Throat:     Mouth: Mucous membranes are moist.  Cardiovascular:     Rate and Rhythm: Normal rate.  Pulmonary:     Effort: Respiratory distress present.  Abdominal:     Palpations: Abdomen is soft.  Skin:    General: Skin is warm and dry.     Coloration: Skin is pale.             Total Time 80 minutes   Time spent includes: Detailed review of medical records (labs, imaging, vital signs), medically appropriate exam (mental status, respiratory, cardiac, skin), discussed with treatment team, counseling and educating patient, family and staff, documenting clinical information, medication management and coordination of care.  Samara Deist L. Bonita Quin, DNP, FNP-BC Palliative Medicine Team

## 2024-02-14 NOTE — Death Summary Note (Signed)
 NAME:  Carlos Richard, MRN:  [id number], DOB:  [birth year], LOS: 5 ADMISSION DATE:  01/21/2024   History of Present Illness:  89 y.o male with significant PMH of atrial fibrillation with slow ventricular response, B12 deficiency, HTN, HLD, COPD, essential tremors (ET), left thalamic DBS stimulator for ET who presented to the ED as a code STEMI.   Per ED reports, patient was sleeping when he woke up screaming.  Patient's wife found him with agonal respiration and started CPR although unclear whether he had a pulse or not.  EMS was called and on their arrival patient was found to be hypotensive, altered with agonal respirations.  Levophed was started with slight improvement in his blood pressure.  Initial EKG concerning for ST elevation in V1 through V3 therefore code STEMI called en route to the ED.   ED Course: Initial vital signs showed HR of beats/minute, BP mm Hg, the RR 30 breaths/minute, and the oxygen saturation % on and a temperature of 98.12F (36.9C).  He was awake somnolent but able to answer questions with only complaint of back pain.  Patient was discussed with STEMI on call who recommended activating Cath Lab.  Due to concern for possible dissection, patient was taken to CT for further evaluation.  Upon return from CT patient had a witnessed generalized tonic-clonic seizure activity.  He was brought back to the ED immediately where he decompensated.  Patient dropped his blood pressure, became bradycardic and then went into asystole.  CPR/ACLS initiated for approximately 15 minutes prior to ROSC achieved.  Patient intubated in the ED.  He was started on Levophed and propofol and taken emergently to Cath Lab.   Pertinent Labs/Diagnostics Findings: Na+/ K+: 139/3.6  Glucose: 296 BUN/Cr.:13/1.47, CO2 14, anion gap 23 WBC: K/L without bands or neutrophil predominance   troponin:   CXR> CTH> CTA Chest> CT Abd/pelvis> Medication administered in the ED: Disposition: Patient was transferred to  the ICU post cardiac cath intubated and sedated.  PCCM consulted   Pertinent  Medical History  Atrial Fibrillation  COPD HTN Essential Tremor    Significant Hospital Events: Including procedures, antibiotic start and stop dates in addition to other pertinent events   3/8: intubated s/p PEA arrest, admit to ICU, LHC negative 3/9: remains intubated, passed SBT, but with depressed mental status and increased secretions 3/10: Overnight pt became bradycardic hr 30 to low 40's secondary to precedex gtt which was discontinued.  Following discontinuation of precedex gtt pt became agitated/combative attempting to hit/bite staff.  This morning pt converted to atrial fibrillation with rvr requiring amiodarone gtt.  He also became hypoxic O2 sats in the 70's FiO2 increased from 40% to 70%.  Propofol gtt restarted.  Converted back to NSR.  Currently requiring levophed gtt at 17 mcg/min to maintain map 65 or higher.   3/11: Pt remains mechanically intubated vent settings: FiO2 40%/PEEP 8.  Now following commands.  Levophed gtt @12  mcg/min to maintain map 65 or higher.  CVP improved 12-13 following iv fluid resuscitation.  Tentative plan for partial hip replacement on 03/13 03/13: Failure to wean off the ventilator. Decision with family was made to proceed with CMO.    Interim History / Subjective:  Patient was seen and examined. He remains intubated and sedated.  On low dose NE.    Objective   Blood pressure (!) 101/53, pulse (!) 112, temperature 100.3 F (37.9 C), temperature source Axillary, resp. rate (!) 27, height 5' 9.02" (1.753 m), weight 79 kg,  SpO2 (!) 54%. CVP:  [10 mmHg-21 mmHg] 14 mmHg   >  Vent Mode: PRVC FiO2 (%):  [40 %] 40 % Set Rate:  [16 bmp] 16 bmp Vt Set:  [600 mL] 600 mL PEEP:  [5 cmH20] 5 cmH20 Plateau Pressure:  [14 cmH20-17 cmH20] 14 cmH20      Intake/Output Summary (Last 24 hours) at  1225 Last data filed at  1206    Gross per 24 hour  Intake 3464.41  ml  Output 619 ml  Net 2845.41 ml         Filed Weights    01/24/24 0354 01/25/24 0346  0432  Weight: 79.6 kg 77.4 kg 79 kg      Examination: General: Intubated and sedated, follows very minimal commands.  HENT: Supple neck, reactive pupils  Lungs: Coarse breath sounds bilaterally Cardiovascular: Normal S1, Normal S2, RRR  Abdomen: Soft, non tender, non distended, +BS  Extremities: Warm and well perfused.    Assessment & Plan:  89 year old male patient with a past medical history of A-fib, hypertension with recurrent falls presenting with altered mental status with subsequent development of cardiac arrest in the hospital.  Intubated on mechanical ventilation.   #Acute hypoxic respiratory failure complicated by #Cardiac arrest secondary to #Distributive shock likely secondary to pneumonia with course complicated by #Right femoral neck fracture #AKI likely secondary to septic ATN #A-fib   Failed SBT today for the third time and appears to be struggling with tachypnea on the vent. Held a discussion with family members and decided to transition patient to CMO. Orders updated in EMR and patient was extubated and passed peacefully on .     Janann Colonel, MD [facility] 01/29/2024 3:46 PM

## 2024-02-14 NOTE — IPAL (Signed)
  Interdisciplinary Goals of Care Family Meeting   Date carried out: 01/31/2024  Location of the meeting: Bedside  Member's involved: Physician, Bedside Registered Nurse, Family Member or next of kin, and Palliative care team member  Durable Power of Attorney or acting medical decision maker: Wife and 2 sons.     Discussion: We discussed goals of care for [name] .  Ongoing severe critical care myopathy with failure to wean off the ventilator. They have opted to proceed with CMO. Orders were placed in EMR. Process explained to family members in details.   Code status:   Code Status: Do not attempt resuscitation (DNR) - Comfort care   Disposition: In-patient comfort care  Time spent for the meeting: 10    Janann Colonel, MD  01/28/2024, 12:24 PM

## 2024-02-14 NOTE — Progress Notes (Signed)
   01/19/2024 1100  Spiritual Encounters  Type of Visit Initial  Care provided to: Pt and family  Conversation partners present during encounter Physician  Referral source Nurse (RN/NT/LPN)  Reason for visit End-of-life  OnCall Visit Yes   Chaplain provided compassionate presence, empathy and prayer for patient and family during the time of EOL.  Chaplain spiritual support services remain available as the need arises.

## 2024-02-14 NOTE — Consult Note (Signed)
 PHARMACY - ANTICOAGULATION CONSULT NOTE  Pharmacy Consult for Heparin Infusion Indication: atrial fibrillation  No Known Allergies  Patient Measurements: Height: 5' 9.02" (175.3 cm) Weight: 79 kg (174 lb 2.6 oz) IBW/kg (Calculated) : 70.74 Heparin Dosing Weight: 71.2 kg   Vital Signs: Temp: 100.6 F (38.1 C) (03/13 0012) Temp Source: Axillary (03/13 0012) BP: 130/60 (03/13 0430) Pulse Rate: 78 (03/13 0430)  Labs: Recent Labs    01/23/24 0757 01/23/24 1229 01/23/24 1624 01/23/24 2249 01/24/24 0350 01/24/24 0617 01/24/24 0617 01/25/24 0410 01/25/24 1226 01/25/24 2150 01/18/2024 0548  HGB  --   --    < >  --  10.2*  --   --  9.4*  --   --  9.1*  HCT  --   --    < >  --  31.6*  --   --  28.7*  --   --  27.8*  PLT  --   --   --   --  116*  --   --  109*  --   --  130*  APTT  --  49*  --  76*  --  69*  --   --   --   --   --   HEPARINUNFRC  --   --   --   --   --  0.34   < > 0.25* 0.27* 0.41 0.42  CREATININE  --   --   --   --  1.88*  --   --  1.53*  --   --  1.70*  TROPONINIHS 540*  --   --   --   --   --   --   --   --   --   --    < > = values in this interval not displayed.   Estimated Creatinine Clearance: 29.5 mL/min (A) (by C-G formula based on SCr of 1.7 mg/dL (H)).  Medical History: Past Medical History:  Diagnosis Date   Atrial fibrillation (HCC)    COPD (chronic obstructive pulmonary disease) (HCC)    Hypertension    Near syncope    Tremors of nervous system    Assessment: Carlos Richard is a 89 y.o. male presenting with STEMI. PMH significant for essential tremor, BPH, COPD, AF, HLD, CKD, HTN. Patient was on [facility] PTA per chart review. From chart review, patient takes Xarelto 15 mg daily at home. Per patient's wife, last dose was taken 3/7 afternoon.  Pharmacy has been consulted to initiate and manage heparin infusion.   Baseline Labs: aPTT 124, PT 23.0, INR 2.0, Hgb 13.3, Hct 41.9, Plt 215   Goal of Therapy:  Heparin level 0.3-0.7 units/ml aPTT 66-102  seconds Monitor platelets by anticoagulation protocol: Yes   Plan: 3/13:  HL @ 0548 = 0.42, therapeutic X 2 - Will continue pt on current rate and recheck HL on 3/14 with AM labs.   - Continue to monitor H&H and platelets daily while on heparin infusion    Thank you allowing pharmacy to participate in this patient's care.   Deasiah Hagberg D, PharmD 01/27/2024 6:28 AM

## 2024-02-14 NOTE — Progress Notes (Signed)
Patient extubated to room air for comfort measures. 

## 2024-02-14 NOTE — Progress Notes (Addendum)
 Was notified by ICU team that patient was being transitioned to comfort measures only. Given this, there eis no plan for surgical intervention. Will follow peripherally. Please page with any further questions.

## 2024-02-14 DEATH — deceased

## 2024-03-19 ENCOUNTER — Other Ambulatory Visit: Payer: Self-pay | Admitting: Family Medicine

## 2024-03-30 ENCOUNTER — Ambulatory Visit: Payer: Medicare Other | Admitting: Cardiovascular Disease

## 2024-04-02 ENCOUNTER — Other Ambulatory Visit: Payer: Self-pay | Admitting: Family Medicine

## 2024-04-24 ENCOUNTER — Encounter
